# Patient Record
Sex: Female | Born: 1985 | Race: Black or African American | Hispanic: No | Marital: Married | Smoking: Never smoker
Health system: Southern US, Community
[De-identification: ages and names within clinical notes are randomized; demographics above are authoritative.]

## PROBLEM LIST (undated history)

## (undated) DIAGNOSIS — R51 Headache: Secondary | ICD-10-CM

## (undated) DIAGNOSIS — D219 Benign neoplasm of connective and other soft tissue, unspecified: Secondary | ICD-10-CM

## (undated) DIAGNOSIS — R519 Headache, unspecified: Secondary | ICD-10-CM

## (undated) DIAGNOSIS — D649 Anemia, unspecified: Secondary | ICD-10-CM

## (undated) HISTORY — PX: DILATION AND CURETTAGE OF UTERUS: SHX78

## (undated) HISTORY — DX: Benign neoplasm of connective and other soft tissue, unspecified: D21.9

## (undated) HISTORY — DX: Headache, unspecified: R51.9

## (undated) HISTORY — DX: Headache: R51

---

## 2015-02-01 ENCOUNTER — Ambulatory Visit (INDEPENDENT_AMBULATORY_CARE_PROVIDER_SITE_OTHER): Payer: BLUE CROSS/BLUE SHIELD | Admitting: Neurology

## 2015-02-01 ENCOUNTER — Encounter: Payer: Self-pay | Admitting: *Deleted

## 2015-02-01 ENCOUNTER — Encounter: Payer: Self-pay | Admitting: Neurology

## 2015-02-01 VITALS — BP 148/78 | HR 85 | Ht 65.0 in | Wt 219.8 lb

## 2015-02-01 DIAGNOSIS — G43709 Chronic migraine without aura, not intractable, without status migrainosus: Secondary | ICD-10-CM | POA: Diagnosis not present

## 2015-02-01 DIAGNOSIS — H9193 Unspecified hearing loss, bilateral: Secondary | ICD-10-CM

## 2015-02-01 DIAGNOSIS — H5319 Other subjective visual disturbances: Secondary | ICD-10-CM | POA: Diagnosis not present

## 2015-02-01 DIAGNOSIS — G43009 Migraine without aura, not intractable, without status migrainosus: Secondary | ICD-10-CM

## 2015-02-01 DIAGNOSIS — R110 Nausea: Secondary | ICD-10-CM | POA: Diagnosis not present

## 2015-02-01 DIAGNOSIS — R51 Headache with orthostatic component, not elsewhere classified: Secondary | ICD-10-CM

## 2015-02-01 DIAGNOSIS — R519 Headache, unspecified: Secondary | ICD-10-CM

## 2015-02-01 DIAGNOSIS — H53149 Visual discomfort, unspecified: Secondary | ICD-10-CM

## 2015-02-01 MED ORDER — TOPIRAMATE ER 50 MG PO CAP24: 50.0000 mg | ORAL_CAPSULE | Freq: Every day | ORAL | Status: DC

## 2015-02-01 MED ORDER — ONDANSETRON 8 MG PO TBDP: 8.0000 mg | ORAL_TABLET | Freq: Three times a day (TID) | ORAL | Status: DC | PRN

## 2015-02-01 MED ORDER — SUMATRIPTAN SUCCINATE 11 MG/NOSEPC NA EXHP: 2.0000 | INHALANT_POWDER | Freq: Once | NASAL | Status: DC

## 2015-02-01 NOTE — Progress Notes (Signed)
[facility]    Provider:  Dr Jaynee Eagles Referring Provider: Ledell Noss Primary Care Physician:  No primary care provider on file.  CC:  Migraines  HPI:  Maria Osborn is a 29 y.o. female here as a referral from Dr. Laverta Baltimore for migraines since grade school. The migraines are getting more frequent and more severe. Three times a week. When they occur she can't function, she take OTC medication which doesn't work. Imitrex and the oral triptans make her queasy. Headaches are throbbing in the temples, can be occipital as well, she has light sensitivity, no sound sensitivity, no smell triggers, endorses that nausea and vomiting and dizziness acompany the symptoms. They can be 10/10 and she calls out of work and doesn't do anything but lay in a dark room, she has to sit still. No medication overuse.They will last the whole entire day up to 24 hours. She is waking up with headaches. She snores. She has woken herself up snoring. No witnessed apneic events. She is fatigued during the days. Her father has migraines. She has tried verapamil in the past but topamax and others don't sound familiar. She has associated hearing changes and swooshing in the ears. No aura.   Review of Systems: Patient complains of symptoms per HPI as well as the following symptoms: headche, weakness, confusion, dizziness, insomnia, not enough sleep, decreased energy, spinning sensation. Pertinent negatives per HPI. All others negative.   Social History   Social History  . Marital Status: Single    Spouse Name: N/A  . Number of Children: 0  . Years of Education: 16   Occupational History  . [employer]    Social History Main Topics  . Smoking status: Never Smoker   . Smokeless tobacco: Not on file  . Alcohol Use: 0.0 oz/week    0 Standard drinks or equivalent per week     Comment: Socially  . Drug Use: No  . Sexual Activity: Not on file   Other Topics Concern  . Not on file   Social  History Narrative   Lives at home with boyfriend   Caffeine use: Drinks tea (4 cups per week)   No soda/coffee    Family History  Problem Relation Age of Onset  . Migraines Father     Past Medical History  Diagnosis Date  . Headache     Past Surgical History  Procedure Laterality Date  . No past surgeries      Current Outpatient Prescriptions  Medication Sig Dispense Refill  . ferrous sulfate 325 (65 FE) MG tablet Take 325 mg by mouth 2 (two) times daily.  2  . ondansetron (ZOFRAN ODT) 8 MG disintegrating tablet Take 1 tablet (8 mg total) by mouth every 8 (eight) hours as needed for nausea or vomiting. 30 tablet 11  . SUMAtriptan Succinate (ONZETRA XSAIL) 11 MG/NOSEPC EXHP Place 2 sprays into the nose once. 2 each 0  . Topiramate ER (TROKENDI XR) 50 MG CP24 Take 50 mg by mouth at bedtime. 30 capsule 11  . Vitamin D, Ergocalciferol, (DRISDOL) 50000 UNITS CAPS capsule TAKE ONE CAPSULE BY MOUTH EVERY WEEK AS DIRECTED  1   No current facility-administered medications for this visit.    Allergies as of 02/01/2015  . (No Known Allergies)    Vitals: BP 148/78 mmHg  Pulse 85  Ht 5\' 5"  (1.651 m)  Wt 219 lb 12.8 oz (99.701 kg)  BMI 36.58 kg/m2  SpO2 98% Last Weight:  Wt Readings from Last  1 Encounters:  02/01/15 219 lb 12.8 oz (99.701 kg)   Last Height:   Ht Readings from Last 1 Encounters:  02/01/15 5\' 5"  (1.651 m)    Physical exam: Exam: Gen: NAD, conversant, well nourised, obese, well groomed                     CV: RRR, no MRG. No Carotid Bruits. No peripheral edema, warm, nontender Eyes: Conjunctivae clear without exudates or hemorrhage  Neuro: Detailed Neurologic Exam  Speech:    Speech is normal; fluent and spontaneous with normal comprehension.  Cognition:    The patient is oriented to person, place, and time;     recent and remote memory intact;     language fluent;     normal attention, concentration,     fund of knowledge Cranial Nerves:    The  pupils are equal, round, and reactive to light. The fundi are normal and spontaneous venous pulsations are present. Visual fields are full to finger confrontation. Extraocular movements are intact. Trigeminal sensation is intact and the muscles of mastication are normal. The face is symmetric. The palate elevates in the midline. Hearing intact. Voice is normal. Shoulder shrug is normal. The tongue has normal motion without fasciculations.   Coordination:    Normal finger to nose and heel to shin. Normal rapid alternating movements.   Gait:    Heel-toe and tandem gait are normal.   Motor Observation:    No asymmetry, no atrophy, and no involuntary movements noted. Tone:    Normal muscle tone.    Posture:    Posture is normal. normal erect    Strength:    Strength is V/V in the upper and lower limbs.      Sensation: intact to LT     Reflex Exam:  DTR's:    Deep tendon reflexes in the upper and lower extremities are normal bilaterally.   Toes:    The toes are downgoing bilaterally.   Clonus:    Clonus is absent.      Assessment/Plan:  29 year old with chronic migraines.  As far as your medications are concerned, I would like to suggest:  Trokendi 25mg  at night then increase to 50mg  at night. Discussed side effects including teratogenicity, nephrolithiasis, paresthesias, somnolence and other side effects. Call for anything concerning.  Onzetra at onset of headache. Can repeat once in 2 hours if needed. Do not take if pregnant. May cause side effects such as bad taste, sinus/nose pain, mild headache (not a migraine); pressure or heavy feeling in any part of your body; feeling hot or cold; dizziness, spinning sensation; drowsiness; nausea, vomiting, drooling; unusual taste in your mouth after using the nasal spray; Stop for CP, SOB.   Zofran as needed for nausea  As far as diagnostic testing: MRi of the brain  To prevent or relieve headaches, try the following: Cool Compress.  Lie down and place a cool compress on your head.  Avoid headache triggers. If certain foods or odors seem to have triggered your migraines in the past, avoid them. A headache diary might help you identify triggers.  Include physical activity in your daily routine. Try a daily walk or other moderate aerobic exercise.  Manage stress. Find healthy ways to cope with the stressors, such as delegating tasks on your to-do list.  Practice relaxation techniques. Try deep breathing, yoga, massage and visualization.  Eat regularly. Eating regularly scheduled meals and maintaining a healthy diet might help prevent  headaches. Also, drink plenty of fluids.  Follow a regular sleep schedule. Sleep deprivation might contribute to headaches Consider biofeedback. With this mind-body technique, you learn to control certain bodily functions - such as muscle tension, heart rate and blood pressure - to prevent headaches or reduce headache pain.    Proceed to emergency room if you experience new or worsening symptoms or symptoms do not resolve, if you have new neurologic symptoms or if headache is severe, or for any concerning symptom.   CC:Dr. Shanon Rosser  Sarina Ill, MD  [facility] [address] [place], [place] [address]  Phone [phone] Fax [phone]

## 2015-02-01 NOTE — Patient Instructions (Signed)
Overall you are doing fairly well but I do want to suggest a few things today:   Remember to drink plenty of fluid, eat healthy meals and do not skip any meals. Try to eat protein with a every meal and eat a healthy snack such as fruit or nuts in between meals. Try to keep a regular sleep-wake schedule and try to exercise daily, particularly in the form of walking, 20-30 minutes a day, if you can.   As far as your medications are concerned, I would like to suggest:   Trokendi 25mg  at night then increase to 50mg  at night. Onzetra at onset of headache. Can repeat once in 2 hours if needed. Zofran as needed for nausea  As far as diagnostic testing: MRi of the brain  I would like to see you back in 3 months, sooner if we need to. Please call us with any interim questions, concerns, problems, updates or refill requests.   Please also call us for any test results so we can go over those with you on the phone.  My clinical assistant and will answer any of your questions and relay your messages to me and also relay most of my messages to you.   Our phone number is [phone]. We also have an after hours call service for urgent matters and there is a physician on-call for urgent questions. For any emergencies you know to call 911 or go to the nearest emergency room

## 2015-02-05 ENCOUNTER — Encounter: Payer: Self-pay | Admitting: Neurology

## 2015-02-05 DIAGNOSIS — G43709 Chronic migraine without aura, not intractable, without status migrainosus: Secondary | ICD-10-CM | POA: Insufficient documentation

## 2015-05-02 ENCOUNTER — Encounter: Payer: Self-pay | Admitting: Nurse Practitioner

## 2015-05-02 ENCOUNTER — Ambulatory Visit: Payer: BLUE CROSS/BLUE SHIELD | Admitting: Neurology

## 2015-05-02 ENCOUNTER — Ambulatory Visit (INDEPENDENT_AMBULATORY_CARE_PROVIDER_SITE_OTHER): Payer: BLUE CROSS/BLUE SHIELD | Admitting: Nurse Practitioner

## 2015-05-02 ENCOUNTER — Other Ambulatory Visit: Payer: Self-pay | Admitting: *Deleted

## 2015-05-02 ENCOUNTER — Telehealth: Payer: Self-pay | Admitting: *Deleted

## 2015-05-02 VITALS — BP 138/87 | HR 98 | Ht 65.0 in | Wt 221.2 lb

## 2015-05-02 DIAGNOSIS — G43709 Chronic migraine without aura, not intractable, without status migrainosus: Secondary | ICD-10-CM

## 2015-05-02 DIAGNOSIS — G43009 Migraine without aura, not intractable, without status migrainosus: Secondary | ICD-10-CM

## 2015-05-02 NOTE — Progress Notes (Addendum)
[facility]  PATIENT: Maria Osborn DOB: Oct 20, 1985   REASON FOR VISIT:migraine, worsening headaches,  HISTORY FROM:patient    HISTORY OF PRESENT ILLNESS:[name]30 year old female returns for follow-up. She was placed on trokendi and got up to 50 mg daily by samples however she ever called for a refill. She has been out of medication for 3 weeks her headaches are worse. She is wanting a prescription to be called in.[place] did [place] like the [place] She says she had nose bleeds.she is currently having headaches about 3 times a week but this was greatly reduced when she was on trokendi. She returns for reevaluation   HISTORY: Maria Osborn is a 30 y.o. female here as a referral from Dr. Laverta Baltimore for migraines since grade school. The migraines are getting more frequent and more severe. Three times a week. When they occur she can't function, she take OTC medication which doesn't work. Imitrex and the oral triptans make her queasy. Headaches are throbbing in the temples, can be occipital as well, she has light sensitivity, no sound sensitivity, no smell triggers, endorses that nausea and vomiting and dizziness acompany the symptoms. They can be 10/10 and she calls out of work and doesn't do anything but lay in a dark room, she has to sit still. No medication overuse.They will last the whole entire day up to 24 hours. She is waking up with headaches. She snores. She has woken herself up snoring. No witnessed apneic events. She is fatigued during the days. Her father has migraines. She has tried verapamil in the past but topamax and others don't sound familiar. She has associated hearing changes and swooshing in the ears. No aura.    REVIEW OF SYSTEMS: Full 14 system review of systems performed and notable only for those listed, all others are neg:  Constitutional: neg  Cardiovascular: neg Ear/Nose/Throat: neg  Skin: neg Eyes: light sensitivity Respiratory:  neg Gastroitestinal: neg  Hematology/Lymphatic: neg  Endocrine: neg Musculoskeletal:neg Allergy/Immunology: neg Neurological: headache Psychiatric: neg Sleep : neg   ALLERGIES: No Known Allergies  HOME MEDICATIONS: Outpatient Prescriptions Prior to Visit  Medication Sig Dispense Refill  . ferrous sulfate 325 (65 FE) MG tablet Take 325 mg by mouth 2 (two) times daily.  2  . ondansetron (ZOFRAN ODT) 8 MG disintegrating tablet Take 1 tablet (8 mg total) by mouth every 8 (eight) hours as needed for nausea or vomiting. 30 tablet 11  . SUMAtriptan Succinate (ONZETRA XSAIL) 11 MG/NOSEPC EXHP Place 2 sprays into the nose once. 2 each 0  . Vitamin D, Ergocalciferol, (DRISDOL) 50000 UNITS CAPS capsule TAKE ONE CAPSULE BY MOUTH EVERY WEEK AS DIRECTED  1  . Topiramate ER (TROKENDI XR) 50 MG CP24 Take 50 mg by mouth at bedtime. (Patient not taking: Reported on 05/02/2015) 30 capsule 11   No facility-administered medications prior to visit.    PAST MEDICAL HISTORY: Past Medical History  Diagnosis Date  . Headache     PAST SURGICAL HISTORY: Past Surgical History  Procedure Laterality Date  . No past surgeries      FAMILY HISTORY: Family History  Problem Relation Age of Onset  . Migraines Father     SOCIAL HISTORY: Social History   Social History  . Marital Status: Single    Spouse Name: N/A  . Number of Children: 0  . Years of Education: 16   Occupational History  . [employer]    Social History Main Topics  . Smoking status: Never Smoker   .  Smokeless tobacco: Not on file  . Alcohol Use: 0.0 oz/week    0 Standard drinks or equivalent per week     Comment: Socially  . Drug Use: No  . Sexual Activity: Not on file   Other Topics Concern  . Not on file   Social History Narrative   Lives at home with boyfriend   Caffeine use: Drinks tea (4 cups per week)   No soda/coffee     PHYSICAL EXAM  Filed Vitals:   05/02/15 1632  BP: 138/87  Pulse: 98  Height:  5\' 5"  (1.651 m)  Weight: 221 lb 3.2 oz (100.336 kg)   Body mass index is 36.81 kg/(m^2).  Generalized: Well developed, in no acute distress  Head: normocephalic and atraumatic,. Oropharynx benign  Neck: Supple, no carotid bruits  Cardiac: Regular rate rhythm, no murmur  Musculoskeletal: No deformity   Neurological examination   Mentation: Alert oriented to time, place, history taking. Attention span and concentration appropriate. Recent and remote memory intact.  Follows all commands speech and language fluent.   Cranial nerve II-XII: Fundoscopic exam reveals sharp disc margins.Pupils were equal round reactive to light extraocular movements were full, visual field were full on confrontational test. Facial sensation and strength were normal. hearing was intact to finger rubbing bilaterally. Uvula tongue midline. head turning and shoulder shrug were normal and symmetric.Tongue protrusion into cheek strength was normal. Motor: normal bulk and tone, full strength in the BUE, BLE, fine finger movements normal, no pronator drift. No focal weakness Sensory: normal and symmetric to light touch, pinprick, and  Vibration, proprioception  Coordination: finger-nose-finger, heel-to-shin bilaterally, no dysmetria Reflexes: Brachioradialis 2/2, biceps 2/2, triceps 2/2, patellar 2/2, Achilles 2/2, plantar responses were flexor bilaterally. Gait and Station: Rising up from seated position without assistance, normal stance,  moderate stride, good arm swing, smooth turning, able to perform tiptoe, and heel walking without difficulty. Tandem gait is steady  DIAGNOSTIC DATA (LABS, IMAGING, TESTING) - ASSESSMENT AND PLAN  30 y.o. year old female  has a past medical history of Headache. here to follow up for her headaches. She is having about 3 weeks that she stopped her trokendi 3 weeks ago.Onzetra causes nosebleeds.She has not scheduled for MRI of the brain which  was ordered by Dr.Ahern.   Schedule MRI of  the brain ordered by Dr. Jaynee Eagles 02/01/15.  Will order Trokendi 50mg  daily called in F/U in 3 months Dennie Bible, [facility], [facility], APRN  [facility] [address], [place], [place] [address] [phone]   Personally examined images, and have participated in and made any corrections needed to history, physical, neuro exam,assessment and plan as stated above and agree with plan.    Sarina Ill, MD Neurology [facility]

## 2015-05-02 NOTE — Telephone Encounter (Signed)
Pt agreed to move and be seen by Hoyle Sauer. Appt time is 4:30p today.

## 2015-05-02 NOTE — Patient Instructions (Signed)
Schedule MRI of the brain ordered by Dr. Jaynee Eagles 02/01/15.  Will order Trokendi 50mg  daily F/U in 3 months

## 2015-05-02 NOTE — Telephone Encounter (Signed)
Pt did not receive, or was not at pharmacy.   I gave verbal to pharmacist at [facility].   Topiramate 50mg  po bedtime.   # 30, 11 refills.

## 2015-05-02 NOTE — Telephone Encounter (Signed)
LVM for pt to call. Dr Jaynee Eagles would like her to see Maria Osborn today if possible so she can fit in a sick pt. If not, that's okay. Gave [facility] phone number

## 2015-05-03 MED ORDER — TOPIRAMATE ER 50 MG PO CAP24: 50.0000 mg | ORAL_CAPSULE | Freq: Every day | ORAL | Status: DC

## 2015-05-07 ENCOUNTER — Other Ambulatory Visit: Payer: Self-pay | Admitting: Neurology

## 2015-05-07 ENCOUNTER — Telehealth: Payer: Self-pay | Admitting: Nurse Practitioner

## 2015-05-07 MED ORDER — TOPIRAMATE 50 MG PO TABS: 50.0000 mg | ORAL_TABLET | Freq: Every day | ORAL | Status: DC

## 2015-05-07 NOTE — Telephone Encounter (Signed)
Patient called to advise pharmacy is waiting on insurance approval for topiramate (TOPAMAX) 50 MG tablet, when patient called [employer], they advised patient that they are waiting on our office to fax over authorization release from the Provider. Patient would like to speak to nurse, please call [phone].

## 2015-05-08 NOTE — Telephone Encounter (Signed)
Called pt back and advised Dr Jaynee Eagles called in new rx topiramate (not ER) to her pharmacy yesterday. I called her pharmacy and it's ready to be picked up. No Pa required. Pt verbalized understanding. She also stated she is going to drop off FMLA paperwork. Advised there is a 25 dollar fee and she has at least 14 days to complete. She states paperwork due 05/30/15.

## 2015-05-10 ENCOUNTER — Telehealth: Payer: Self-pay | Admitting: *Deleted

## 2015-05-10 NOTE — Telephone Encounter (Signed)
Patient FMLA form on [employer].

## 2015-05-11 DIAGNOSIS — Z0289 Encounter for other administrative examinations: Secondary | ICD-10-CM

## 2015-05-17 ENCOUNTER — Telehealth: Payer: Self-pay | Admitting: *Deleted

## 2015-05-17 NOTE — Telephone Encounter (Signed)
Gave completed FMLA paperwork back to Hilda Blades in medical records to send.

## 2015-05-17 NOTE — Telephone Encounter (Signed)
Patient form was faxed to [facility] on 05/17/15.

## 2015-06-06 ENCOUNTER — Ambulatory Visit (INDEPENDENT_AMBULATORY_CARE_PROVIDER_SITE_OTHER): Payer: BLUE CROSS/BLUE SHIELD

## 2015-06-06 DIAGNOSIS — R110 Nausea: Secondary | ICD-10-CM | POA: Diagnosis not present

## 2015-06-06 DIAGNOSIS — H9193 Unspecified hearing loss, bilateral: Secondary | ICD-10-CM

## 2015-06-06 DIAGNOSIS — R51 Headache with orthostatic component, not elsewhere classified: Secondary | ICD-10-CM

## 2015-06-06 DIAGNOSIS — R519 Headache, unspecified: Secondary | ICD-10-CM

## 2015-06-07 MED ORDER — GADOPENTETATE DIMEGLUMINE 469.01 MG/ML IV SOLN: 20.0000 mL | Freq: Once | INTRAVENOUS | Status: AC | PRN

## 2015-06-11 ENCOUNTER — Telehealth: Payer: Self-pay | Admitting: *Deleted

## 2015-06-11 NOTE — Telephone Encounter (Signed)
-----   Message from Melvenia Beam, MD sent at 06/08/2015  3:34 PM EDT ----- Mri of the brain is normal thanks

## 2015-06-11 NOTE — Telephone Encounter (Signed)
Called and spoke to pt about normal MRI brain per Dr Ahern. Pt verbalized understanding.  

## 2015-08-08 ENCOUNTER — Encounter: Payer: Self-pay | Admitting: Nurse Practitioner

## 2015-08-08 ENCOUNTER — Ambulatory Visit (INDEPENDENT_AMBULATORY_CARE_PROVIDER_SITE_OTHER): Payer: BLUE CROSS/BLUE SHIELD | Admitting: Nurse Practitioner

## 2015-08-08 VITALS — BP 132/80 | HR 83 | Ht 65.0 in | Wt 222.0 lb

## 2015-08-08 DIAGNOSIS — G43709 Chronic migraine without aura, not intractable, without status migrainosus: Secondary | ICD-10-CM | POA: Diagnosis not present

## 2015-08-08 MED ORDER — TOPIRAMATE 50 MG PO TABS: 50.0000 mg | ORAL_TABLET | Freq: Two times a day (BID) | ORAL | Status: DC

## 2015-08-08 MED ORDER — RIZATRIPTAN BENZOATE 10 MG PO TBDP: 10.0000 mg | ORAL_TABLET | ORAL | Status: DC | PRN

## 2015-08-08 NOTE — Progress Notes (Addendum)
[facility]  PATIENT: Maria Osborn DOB: [birth year]   REASON FOR VISIT: follow up for chronic migraine, neck pain HISTORY FROM:patient    HISTORY OF PRESENT ILLNESS:[name]30 year old female returns for follow-up. She was placed on trokendi and got up to 50 mg daily by samples however insurance would no. When last seen 05/02/2015 she was switched to generic topiramate. Sumatriptan not effective, and  caused nasal irritation. Headache (location now back of head, and on and off during day).   She is wanting a prescription to be called in.[place] did [place] the [place] She says she had nose bleeds.she is currently having headaches about 3 times a week but this was greatly reduced when she was on trokendi. She returns for reevaluation   HISTORY: Maria Osborn is a 30 y.o. female here as a referral from Dr. Laverta Baltimore for migraines since grade school. The migraines are getting more frequent and more severe. Three times a week. When they occur she can't function, she take OTC medication which doesn't work. Imitrex and the oral triptans make her queasy. Headaches are throbbing in the temples, can be occipital as well, she has light sensitivity, no sound sensitivity, no smell triggers, endorses that nausea and vomiting and dizziness acompany the symptoms. They can be 10/10 and she calls out of work and doesn't do anything but lay in a dark room, she has to sit still. No medication overuse.They will last the whole entire day up to 24 hours. She is waking up with headaches. She snores. She has woken herself up snoring. No witnessed apneic events. She is fatigued during the days. Her father has migraines. She has tried verapamil in the past but topamax and others don't sound familiar. She has associated hearing changes and swooshing in the ears. No aura.    REVIEW OF SYSTEMS: Full 14 system review of systems performed and notable only for those listed, all others are neg:    Constitutional: neg  Cardiovascular: neg Ear/Nose/Throat: neg  Skin: neg Eyes: neg Respiratory: neg Gastroitestinal: neg  Hematology/Lymphatic: neg  Endocrine: neg Musculoskeletal:neck stiffness Allergy/Immunology: neg Neurological: headache Psychiatric: neg Sleep : neg   ALLERGIES: No Known Allergies  HOME MEDICATIONS: Outpatient Prescriptions Prior to Visit  Medication Sig Dispense Refill  . ferrous sulfate 325 (65 FE) MG tablet Take 325 mg by mouth 2 (two) times daily.  2  . ondansetron (ZOFRAN ODT) 8 MG disintegrating tablet Take 1 tablet (8 mg total) by mouth every 8 (eight) hours as needed for nausea or vomiting. 30 tablet 11  . topiramate (TOPAMAX) 50 MG tablet Take 1 tablet (50 mg total) by mouth at bedtime. 30 tablet 12  . Vitamin D, Ergocalciferol, (DRISDOL) 50000 UNITS CAPS capsule TAKE ONE CAPSULE BY MOUTH EVERY WEEK AS DIRECTED  1  . SUMAtriptan Succinate (ONZETRA XSAIL) 11 MG/NOSEPC EXHP Place 2 sprays into the nose once. (Patient not taking: Reported on 08/08/2015) 2 each 0   Facility-Administered Medications Prior to Visit  Medication Dose Route Frequency Provider Last Rate Last Dose  . gadopentetate dimeglumine (MAGNEVIST) injection 20 mL  20 mL Intravenous Once PRN Melvenia Beam, MD        PAST MEDICAL HISTORY: Past Medical History  Diagnosis Date  . Headache     PAST SURGICAL HISTORY: Past Surgical History  Procedure Laterality Date  . No past surgeries      FAMILY HISTORY: Family History  Problem Relation Age of Onset  . Migraines Father     SOCIAL HISTORY:  Social History   Social History  . Marital Status: Single    Spouse Name: N/A  . Number of Children: 0  . Years of Education: 16   Occupational History  . [employer]    Social History Main Topics  . Smoking status: Never Smoker   . Smokeless tobacco: Not on file  . Alcohol Use: 0.0 oz/week    0 Standard drinks or equivalent per week     Comment: Socially  . Drug Use:  No  . Sexual Activity: Not on file   Other Topics Concern  . Not on file   Social History Narrative   Lives at home with boyfriend   Caffeine use: Drinks tea (4 cups per week)   No soda/coffee     PHYSICAL EXAM  Filed Vitals:   08/08/15 1626  BP: 132/80  Pulse: 83  Height: 5\' 5"  (075-GRM m)  Weight: 222 lb (100.699 kg)   Body mass index is 36.94 kg/(m^2). Generalized: Well developed, in no acute distress  Head: normocephalic and atraumatic,. Oropharynx benign  Neck: Supple, no carotid bruits  Cardiac: Regular rate rhythm, no murmur  Musculoskeletal: No deformity   Neurological examination   Mentation: Alert oriented to time, place, history taking. Attention span and concentration appropriate. Recent and remote memory intact. Follows all commands speech and language fluent.   Cranial nerve II-XII: Fundoscopic exam reveals sharp disc margins.Pupils were equal round reactive to light extraocular movements were full, visual field were full on confrontational test. Facial sensation and strength were normal. hearing was intact to finger rubbing bilaterally. Uvula tongue midline. head turning and shoulder shrug were normal and symmetric.Tongue protrusion into cheek strength was normal. Motor: normal bulk and tone, full strength in the BUE, BLE, fine finger movements normal, no pronator drift. No focal weakness Sensory: normal and symmetric to light touch, pinprick, and Vibration, proprioception  Coordination: finger-nose-finger, heel-to-shin bilaterally, no dysmetria Reflexes: Brachioradialis 2/2, biceps 2/2, triceps 2/2, patellar 2/2, Achilles 2/2, plantar responses were flexor bilaterally. Gait and Station: Rising up from seated position without assistance, normal stance, moderate stride, good arm swing, smooth turning, able to perform tiptoe, and heel walking without difficulty. Tandem gait is steady  DIAGNOSTIC DATA (LABS, IMAGING, TESTING)  ASSESSMENT AND PLAN  30 y.o.  year old female  has a past medical history of Headache. here to follow up She is currently h 3 headaches a week and also complaining with some neck pain. MRI of the brain after her last visit was normal.  Increase Topamax  To 50 mg twice daily Try Maxalt 10 mg acutely melt Given neck exercises to perform  Keep a record of your headaches Follow-up in 3 mo Dennie Bible, [facility], [facility], APRN  Personally  participated in and made any corrections needed to history, physical, neuro exam,assessment and plan as stated above.   Sarina Ill, MD [facility] [address] [place], [place] [address] [phone]

## 2015-08-08 NOTE — Patient Instructions (Signed)
Increase Topamax  To 50 mg twice daily Try Maxalt 10 mg acutely melt Given neck exercises to Follow-up in 3 mo

## 2015-11-13 ENCOUNTER — Ambulatory Visit: Payer: BLUE CROSS/BLUE SHIELD | Admitting: Nurse Practitioner

## 2015-11-14 ENCOUNTER — Encounter: Payer: Self-pay | Admitting: Nurse Practitioner

## 2015-12-12 ENCOUNTER — Encounter: Payer: Self-pay | Admitting: Nurse Practitioner

## 2015-12-12 ENCOUNTER — Ambulatory Visit (INDEPENDENT_AMBULATORY_CARE_PROVIDER_SITE_OTHER): Payer: BLUE CROSS/BLUE SHIELD | Admitting: Nurse Practitioner

## 2015-12-12 VITALS — BP 116/74 | HR 72 | Ht 65.0 in | Wt 219.0 lb

## 2015-12-12 DIAGNOSIS — G43709 Chronic migraine without aura, not intractable, without status migrainosus: Secondary | ICD-10-CM | POA: Diagnosis not present

## 2015-12-12 MED ORDER — TOPIRAMATE 50 MG PO TABS: 50.0000 mg | ORAL_TABLET | Freq: Two times a day (BID) | ORAL | 6 refills | Status: DC

## 2015-12-12 MED ORDER — RIZATRIPTAN BENZOATE 10 MG PO TBDP: 10.0000 mg | ORAL_TABLET | ORAL | 6 refills | Status: DC | PRN

## 2015-12-12 NOTE — Patient Instructions (Signed)
Continue  Topamax   50 mg twice daily will refill Continue  Maxalt 10 mg acutely melt, will refill Continue  neck exercises Continue to keep a record of your headaches Follow-up in 6 mo

## 2015-12-12 NOTE — Progress Notes (Signed)
[facility]  PATIENT: Maria Osborn DOB: Sep 21, 1985   REASON FOR VISIT: follow up for chronic migraine, neck pain HISTORY FROM:patient    HISTORY OF PRESENT ILLNESS:UPDATE 10/11/2017CM Ms. Osborn, 30 year old female returns for follow-up with history of migraines. When last seen her Topamax was increased to 50mg  mg twice daily in addition her triptan was changed to Maxalt MLT. She has kept a record since that time she had 5 headaches in June ,7 in July ,0 in August , 1 in September and 1 in October. She has also found the neck exercises be beneficial, she sleeps about 6 hours at night and does not have consistent sleep-wake schedule. She returns for reevaluation   UPDATE 08/08/15 CMMs. [name]50 year old female returns for follow-up. She was placed on trokendi and got up to 50 mg daily by samples however insurance would no. When last seen 05/02/2015 she was switched to generic topiramate. Sumatriptan not effective, and  caused nasal irritation. Headache (location now back of head, and on and off during day).   She is wanting a prescription to be called in.She did not like the [place] She says she had nose bleeds.she is currently having headaches about 3 times a week but this was greatly reduced when she was on trokendi. She returns for reevaluation   HISTORY: Maria Osborn is a 30 y.o. female here as a referral from Dr. Laverta Baltimore for migraines since grade school. The migraines are getting more frequent and more severe. Three times a week. When they occur she can't function, she take OTC medication which doesn't work. Imitrex and the oral triptans make her queasy. Headaches are throbbing in the temples, can be occipital as well, she has light sensitivity, no sound sensitivity, no smell triggers, endorses that nausea and vomiting and dizziness acompany the symptoms. They can be 10/10 and she calls out of work and doesn't do anything but lay in a dark room, she has to sit  still. No medication overuse.They will last the whole entire day up to 24 hours. She is waking up with headaches. She snores. She has woken herself up snoring. No witnessed apneic events. She is fatigued during the days. Her father has migraines. She has tried verapamil in the past but topamax and others don't sound familiar. She has associated hearing changes and swooshing in the ears. No aura.    REVIEW OF SYSTEMS: Full 14 system review of systems performed and notable only for those listed, all others are neg:  Constitutional: Fatigue  Cardiovascular: neg Ear/Nose/Throat: neg  Skin: neg Eyes: neg Respiratory: neg Gastroitestinal: neg  Hematology/Lymphatic: Anemia Endocrine: neg Musculoskeletal:neck stiffness Allergy/Immunology: neg Neurological: headache Psychiatric: neg Sleep : neg   ALLERGIES: No Known Allergies  HOME MEDICATIONS: Outpatient Medications Prior to Visit  Medication Sig Dispense Refill  . ferrous sulfate 325 (65 FE) MG tablet Take 325 mg by mouth 2 (two) times daily.  2  . ondansetron (ZOFRAN ODT) 8 MG disintegrating tablet Take 1 tablet (8 mg total) by mouth every 8 (eight) hours as needed for nausea or vomiting. 30 tablet 11  . rizatriptan (MAXALT-MLT) 10 MG disintegrating tablet Take 1 tablet (10 mg total) by mouth as needed for migraine. May repeat in 2 hours if needed 10 tablet 3  . topiramate (TOPAMAX) 50 MG tablet Take 1 tablet (50 mg total) by mouth 2 (two) times daily. 60 tablet 3  . Vitamin D, Ergocalciferol, (DRISDOL) 50000 UNITS CAPS capsule TAKE ONE CAPSULE BY MOUTH EVERY WEEK AS DIRECTED  1  Facility-Administered Medications Prior to Visit  Medication Dose Route Frequency Provider Last Rate Last Dose  . gadopentetate dimeglumine (MAGNEVIST) injection 20 mL  20 mL Intravenous Once PRN Melvenia Beam, MD        PAST MEDICAL HISTORY: Past Medical History:  Diagnosis Date  . Headache     PAST SURGICAL HISTORY: Past Surgical History:    Procedure Laterality Date  . NO PAST SURGERIES      FAMILY HISTORY: Family History  Problem Relation Age of Onset  . Migraines Father     SOCIAL HISTORY: Social History   Social History  . Marital status: Single    Spouse name: N/A  . Number of children: 0  . Years of education: 74   Occupational History  . [employer]    Social History Main Topics  . Smoking status: Never Smoker  . Smokeless tobacco: Never Used  . Alcohol use 0.0 oz/week     Comment: Socially  . Drug use: No  . Sexual activity: Not on file   Other Topics Concern  . Not on file   Social History Narrative   Lives at home with boyfriend   Caffeine use: Drinks tea (4 cups per week)   No soda/coffee     PHYSICAL EXAM  Vitals:   12/12/15 1501  BP: 116/74  Pulse: 72  Weight: 219 lb (99.3 kg)  Height: 5\' 5"  (1.651 m)   Body mass index is 36.44 kg/m. Generalized: Well developed, in no acute distress  Head: normocephalic and atraumatic,. Oropharynx benign  Neck: Supple, no carotid bruits  Cardiac: Regular rate rhythm, no murmur  Musculoskeletal: No deformity   Neurological examination   Mentation: Alert oriented to time, place, history taking. Attention span and concentration appropriate. Recent and remote memory intact. Follows all commands speech and language fluent.   Cranial nerve II-XII: Pupils were equal round reactive to light extraocular movements were full, visual field were full on confrontational test. Facial sensation and strength were normal. hearing was intact to finger rubbing bilaterally. Uvula tongue midline. head turning and shoulder shrug were normal and symmetric.Tongue protrusion into cheek strength was normal. Motor: normal bulk and tone, full strength in the BUE, BLE, fine finger movements normal, no pronator drift. No focal weakness Sensory: normal and symmetric to light touch, pinprick, and Vibration, In the upper and lower extremities Coordination:  finger-nose-finger, heel-to-shin bilaterally, no dysmetria Reflexes: Brachioradialis 2/2, biceps 2/2, triceps 2/2, patellar 2/2, Achilles 2/2, plantar responses were flexor bilaterally. Gait and Station: Rising up from seated position without assistance, normal stance, moderate stride, good arm swing, smooth turning, able to perform tiptoe, and heel walking without difficulty. Tandem gait is steady  DIAGNOSTIC DATA (LABS, IMAGING, TESTING)  ASSESSMENT AND PLAN  30 y.o. year old female  has a past medical history of Headache. here to follow up . She  had 5 headaches in June ,7 in July ,0 in August , 1 in September and 1 in October.  Continue  Topamax   50 mg twice daily will refill Continue  Maxalt 10 mg acutely melt, will refill Continue  neck exercises Continue to keep a record of your headaches Follow-up in 6 mo Dennie Bible, [facility], [facility], [facility] [address], [place],  [address] [phone]

## 2016-01-03 NOTE — Progress Notes (Signed)
Personally have participated in and made any corrections needed to history, physical, neuro exam,assessment and plan as stated above.    Kemiya Batdorf, MD [facility] 

## 2016-04-09 ENCOUNTER — Encounter: Payer: Self-pay | Admitting: Nurse Practitioner

## 2016-04-09 ENCOUNTER — Ambulatory Visit (INDEPENDENT_AMBULATORY_CARE_PROVIDER_SITE_OTHER): Payer: BLUE CROSS/BLUE SHIELD | Admitting: Nurse Practitioner

## 2016-04-09 VITALS — BP 133/86 | HR 85 | Ht 65.0 in | Wt 225.8 lb

## 2016-04-09 DIAGNOSIS — R400 Somnolence: Secondary | ICD-10-CM

## 2016-04-09 DIAGNOSIS — G43709 Chronic migraine without aura, not intractable, without status migrainosus: Secondary | ICD-10-CM | POA: Diagnosis not present

## 2016-04-09 NOTE — Patient Instructions (Signed)
Continue  Topamax   50 mg twice daily  Continue  Maxalt 10 mg acutely melt,  Continue  neck exercises Continue to keep a record of your headaches Will order sleep study for daytime somnolence  Given information on sleep hygiene Follow-up in 3 mo

## 2016-04-09 NOTE — Progress Notes (Signed)
[facility]  PATIENT: Maria Osborn DOB: 1985/06/20   REASON FOR VISIT: follow up for worsening headaches , neck pain, new complaint of daytime drowsiness HISTORY FROM:patient    HISTORY OF PRESENT ILLNESS:UPDATE 02/07/2018CM Maria Osborn, 31 year old female returns for follow-up with a long history of migraines which have worsened recently. She has had migraines since grade school.  Headaches are throbbing in the temples, can be occipital as well, she has light sensitivity, no sound sensitivity, no smell triggers, endorses that nausea and vomiting and dizziness acompany the symptoms. They can be 10/10 and she calls out of work and doesn't do anything but lay in a dark room. She has kept a record of her headaches since last seen in October, 4 for November 6 for December 14 for January and so far 3 for February. She woke with a headache this morning. Maxalt did not help. She does admit to snoring. She is not aware of any apnea events. She complains with significant daytime drowsiness. She is currently on Topamax 50 mg twice daily and her triptan is Maxalt melt. She also does not have consistent sleep-wake cycle. She returns for reevaluation  REVIEW OF SYSTEMS: Full 14 system review of systems performed and notable only for those listed, all others are neg:  Constitutional: Fatigue  Cardiovascular: neg Ear/Nose/Throat: Ringing in the ears Skin: neg Eyes: Light sensitivity Respiratory: neg Gastroitestinal: neg  Hematology/Lymphatic: Anemia Endocrine: neg Musculoskeletal:neg Allergy/Immunology: neg Neurological: headache Psychiatric: neg Sleep : Daytime drowsiness   ALLERGIES: No Known Allergies  HOME MEDICATIONS: Outpatient Medications Prior to Visit  Medication Sig Dispense Refill  . ferrous sulfate 325 (65 FE) MG tablet Take 325 mg by mouth 2 (two) times daily.  2  . ondansetron (ZOFRAN ODT) 8 MG disintegrating tablet Take 1 tablet (8 mg total) by mouth  every 8 (eight) hours as needed for nausea or vomiting. 30 tablet 11  . rizatriptan (MAXALT-MLT) 10 MG disintegrating tablet Take 1 tablet (10 mg total) by mouth as needed for migraine. May repeat in 2 hours if needed 10 tablet 6  . topiramate (TOPAMAX) 50 MG tablet Take 1 tablet (50 mg total) by mouth 2 (two) times daily. 60 tablet 6  . Vitamin D, Ergocalciferol, (DRISDOL) 50000 UNITS CAPS capsule TAKE ONE CAPSULE BY MOUTH EVERY WEEK AS DIRECTED  1   Facility-Administered Medications Prior to Visit  Medication Dose Route Frequency Provider Last Rate Last Dose  . gadopentetate dimeglumine (MAGNEVIST) injection 20 mL  20 mL Intravenous Once PRN Melvenia Beam, MD        PAST MEDICAL HISTORY: Past Medical History:  Diagnosis Date  . Headache     PAST SURGICAL HISTORY: Past Surgical History:  Procedure Laterality Date  . NO PAST SURGERIES      FAMILY HISTORY: Family History  Problem Relation Age of Onset  . Migraines Father     SOCIAL HISTORY: Social History   Social History  . Marital status: Single    Spouse name: N/A  . Number of children: 0  . Years of education: 48   Occupational History  . [employer]    Social History Main Topics  . Smoking status: Never Smoker  . Smokeless tobacco: Never Used  . Alcohol use 0.0 oz/week     Comment: Socially  . Drug use: No  . Sexual activity: Not on file   Other Topics Concern  . Not on file   Social History Narrative   Lives at home with boyfriend  Caffeine use: Drinks tea (4 cups per week)   No soda/coffee     PHYSICAL EXAM  Vitals:   04/09/16 0803  BP: 133/86  Pulse: 85  Weight: 225 lb 12.8 oz (102.4 kg)  Height: 5\' 5"  (1.651 m)   Body mass index is 37.58 kg/m. Generalized: Well developed, Obese female in no acute distress  Head: normocephalic and atraumatic,. Oropharynx benign  Neck: Supple, no carotid bruits  Cardiac: Regular rate rhythm, no murmur  Musculoskeletal: No deformity    Neurological examination   Mentation: Alert oriented to time, place, history taking. Attention span and concentration appropriate. Recent and remote memory intact. Follows all commands speech and language fluent. ESS 10 FSS 40  Cranial nerve II-XII: Pupils were equal round reactive to light extraocular movements were full, visual field were full on confrontational test. Facial sensation and strength were normal. hearing was intact to finger rubbing bilaterally. Uvula tongue midline. head turning and shoulder shrug were normal and symmetric.Tongue protrusion into cheek strength was normal. Motor: normal bulk and tone, full strength in the BUE, BLE, fine finger movements normal, no pronator drift. No focal weakness Sensory: normal and symmetric to light touch, pinprick, and Vibration, In the upper and lower extremities Coordination: finger-nose-finger, heel-to-shin bilaterally, no dysmetria Reflexes: Symmetric upper and lower, plantar responses were flexor bilaterally. Gait and Station: Rising up from seated position without assistance, normal stance, moderate stride, good arm swing, smooth turning, able to perform tiptoe, and heel walking without difficulty. Tandem gait is steady  DIAGNOSTIC DATA (LABS, IMAGING, TESTING)  ASSESSMENT AND PLAN  31 y.o. year old female  has a past medical history of Headache. which have worsened here to follow-up. Her headaches are now occurring in the morning on awakening and she complains of daytime somnolence. She  had 4 headaches in November ,6 in December ,14 in January and 3 so far in February . She awoke with a headache this morning and Maxalt was not effective   Continue  Topamax   50 mg twice daily  Continue  Maxalt 10 mg acutely melt,  Continue  neck exercises Continue to keep a record of your headaches Will order sleep study for daytime somnolence ESS 10. FSS 40.  Given information on sleep hygiene Follow-up in 3 mo I spent 25 minutes in   total face to face time with the patient more than 50% of which was spent counseling and coordination of care, reviewing test results reviewing medications and discussing and reviewing the diagnosis of morning headaches and correlation to obstructive sleep apnea. The treatment is not increase medication for headache rather oxygen therapy or CPAP. Discussed the sleep study visit and that she will follow up with the sleep doctor after the study for further treatment options.  Dennie Bible, [facility], [facility], APRN [facility] [address] [place], [place] [address] [phone]

## 2016-05-08 ENCOUNTER — Encounter: Payer: Self-pay | Admitting: Neurology

## 2016-05-08 ENCOUNTER — Telehealth: Payer: Self-pay | Admitting: Nurse Practitioner

## 2016-05-08 ENCOUNTER — Ambulatory Visit (INDEPENDENT_AMBULATORY_CARE_PROVIDER_SITE_OTHER): Payer: BLUE CROSS/BLUE SHIELD | Admitting: Neurology

## 2016-05-08 VITALS — BP 138/80 | HR 82 | Resp 16 | Ht 65.0 in | Wt 228.0 lb

## 2016-05-08 DIAGNOSIS — R400 Somnolence: Secondary | ICD-10-CM | POA: Diagnosis not present

## 2016-05-08 DIAGNOSIS — R51 Headache: Secondary | ICD-10-CM

## 2016-05-08 DIAGNOSIS — R0683 Snoring: Secondary | ICD-10-CM | POA: Diagnosis not present

## 2016-05-08 DIAGNOSIS — E669 Obesity, unspecified: Secondary | ICD-10-CM

## 2016-05-08 DIAGNOSIS — R519 Headache, unspecified: Secondary | ICD-10-CM

## 2016-05-08 NOTE — Progress Notes (Addendum)
Subjective:    Patient ID: Maria Osborn is a 31 y.o. female.  HPI     Star Age, MD, PhD [facility] [address] [place], [place] [address]  Dear Maria Osborn and Maria Osborn,   I saw your patient, Maria Osborn, upon your kind request in my neurologic clinic today for initial consultation of her sleep disorder, in particular, concern for underlying obstructive sleep apnea. The patient is unaccompanied today. As you know, Maria Osborn is a 31 year old right-handed woman with an underlying medical history of migraines, vitamin D deficiency and obesity, who reports snoring, AM HAs and daytime somnolence. I reviewed your office note from 04/09/2016. Her Epworth sleepiness score is 11 out of 24 today, her fatigue score is 39 out of 63 today. She often wakes up with headaches. She has had intermittent difficulty with sleep onset and sleep maintenance, melatonin did not help and OTC sleep aids made her drowsy.  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   Bedtime is usually around 11:30 PM and later if she has to work late. She has to be up by 6:30 AM usually, drinks no caffeine on a regular basis. She snores occasionally, does not have night to night nocturia, no RLS symptoms. Has frequent AM HAs, about 3-4 times/week. Does not smoke, very occ EtOH. Has FHx of migraines in father, has one older brother, no FHx of OSA.  She lives with her fiance. She has no children. She keeps a log of her migraine headaches. In the month of February she has taken up to 10 pills a week of Maxalt. She has been on generic Topamax 50 mg twice daily, does not believe it has helped very much.  Her Past Medical History Is Significant For: Past Medical History:  Diagnosis Date  . Fibroids   . Headache     Her Past Surgical History Is Significant For: Past Surgical History:  Procedure Laterality Date  . NO PAST SURGERIES      Her Family History Is  Significant For: Family History  Problem Relation Age of Onset  . Migraines Father     Her Social History Is Significant For: Social History   Social History  . Marital status: Significant Other    Spouse name: N/A  . Number of children: 0  . Years of education: 83   Occupational History  . [employer]    Social History Main Topics  . Smoking status: Never Smoker  . Smokeless tobacco: Never Used  . Alcohol use 0.0 oz/week     Comment: Socially  . Drug use: No  . Sexual activity: Not Asked   Other Topics Concern  . None   Social History Narrative   Lives at home with boyfriend   Caffeine use: Drinks tea (4 cups per week)   No soda/coffee    Her Allergies Are:  No Known Allergies:   Her Current Medications Are:  Outpatient Encounter Prescriptions as of 05/08/2016  Medication Sig  . Ferrous Sulfate (IRON) 28 MG TABS Take by mouth.  . ondansetron (ZOFRAN ODT) 8 MG disintegrating tablet Take 1 tablet (8 mg total) by mouth every 8 (eight) hours as needed for nausea or vomiting.  . rizatriptan (MAXALT-MLT) 10 MG disintegrating tablet Take 1 tablet (10 mg total) by mouth as needed for migraine. May repeat in 2 hours if needed  . topiramate (TOPAMAX) 50 MG tablet Take 1 tablet (50 mg total) by mouth 2 (  two) times daily.  . Vitamin D, Ergocalciferol, (DRISDOL) 50000 UNITS CAPS capsule TAKE ONE CAPSULE BY MOUTH EVERY WEEK AS DIRECTED  . [DISCONTINUED] ferrous sulfate 325 (65 FE) MG tablet Take 325 mg by mouth 2 (two) times daily.   Facility-Administered Encounter Medications as of 05/08/2016  Medication  . gadopentetate dimeglumine (MAGNEVIST) injection 20 mL  :  Review of Systems:  Out of a complete 14 point review of systems, all are reviewed and negative with the exception of these symptoms as listed below:  Review of Systems  Neurological:       Patient has trouble falling and staying asleep, sometimes snores, wakes up feeling tired, morning headaches, daytime  fatigue, sometimes takes naps.    Epworth Sleepiness Scale 0= would never doze 1= slight chance of dozing 2= moderate chance of dozing 3= high chance of dozing  Sitting and reading:2 Watching TV:3 Sitting inactive in a public place (ex. Theater or meeting):0 As a passenger in a car for an hour without a break:3 Lying down to rest in the afternoon:0 Sitting and talking to someone:0 Sitting quietly after lunch (no alcohol):0 In a car, while stopped in traffic:0 Total:11  Objective:  Neurologic Exam  Physical Exam Physical Examination:   Vitals:   05/08/16 1526  BP: 138/80  Pulse: 82  Resp: 16    General Examination: The patient is a very pleasant 31 y.o. female in no acute distress. She appears well-developed and well-nourished and well groomed.   HEENT: Normocephalic, atraumatic, pupils are equal, round and reactive to light and accommodation. Funduscopic exam is normal with sharp disc margins noted. Extraocular tracking is good without limitation to gaze excursion or nystagmus noted. Normal smooth pursuit is noted. Hearing is grossly intact. Tympanic membranes are clear bilaterally. Face is symmetric with normal facial animation and normal facial sensation. Speech is clear with no dysarthria noted. There is no hypophonia. There is no lip, neck/head, jaw or voice tremor. Neck is supple with full range of passive and active motion. There are no carotid bruits on auscultation. Oropharynx exam reveals: mild mouth dryness, good dental hygiene and mild airway crowding, due to smaller airway entry and tonsils in place. Mallampati is class I. Tongue protrudes centrally and palate elevates symmetrically. Tonsils are 1+. Neck size is 15 7/8 inches.    Chest: Clear to auscultation without wheezing, rhonchi or crackles noted.  Heart: S1+S2+0, regular and normal without murmurs, rubs or gallops noted.   Abdomen: Soft, non-tender and non-distended with normal bowel sounds appreciated on  auscultation.  Extremities: There is no pitting edema in the distal lower extremities bilaterally. Pedal pulses are intact.  Skin: Warm and dry without trophic changes noted.  Musculoskeletal: exam reveals no obvious joint deformities, tenderness or joint swelling or erythema.   Neurologically:  Mental status: The patient is awake, alert and oriented in all 4 spheres. Her immediate and remote memory, attention, language skills and fund of knowledge are appropriate. There is no evidence of aphasia, agnosia, apraxia or anomia. Speech is clear with normal prosody and enunciation. Thought process is linear. Mood is normal and affect is normal.  Cranial nerves II - XII are as described above under HEENT exam. In addition: shoulder shrug is normal with equal shoulder height noted. Motor exam: Normal bulk, strength and tone is noted. There is no drift, tremor or rebound. Romberg is negative. Reflexes are 1-2+. Fine motor skills and coordination: intact with normal finger taps, normal hand movements, normal rapid alternating patting, normal foot taps  and normal foot agility.  Cerebellar testing: No dysmetria or intention tremor on finger to nose testing. Heel to shin is unremarkable bilaterally. There is no truncal or gait ataxia.  Sensory exam: intact to light touch in the upper and lower extremities.  Gait, station and balance: She stands easily. No veering to one side is noted. No leaning to one side is noted. Posture is age-appropriate and stance is narrow based. Gait shows normal stride length and normal pace. No problems turning are noted. Tandem walk is unremarkable.      Assessment and Plan:  In summary, Makaylie Dedeaux is a very pleasant 31 y.o.-year old female with an underlying medical history of migraines, vitamin D deficiency and obesity, whose history and physical exam are concerning for obstructive sleep apnea (OSA), due to obesity, borderline neck circumference, recurrent headaches,  especially morning headaches and daytime tiredness.. I had a long chat with the patient about my findings and the diagnosis of OSA, its prognosis and treatment options. We talked about medical treatments, surgical interventions and non-pharmacological approaches. I explained in particular the risks and ramifications of untreated moderate to severe OSA, especially with respect to developing cardiovascular disease down the Road, including congestive heart failure, difficult to treat hypertension, cardiac arrhythmias, or stroke. Even type 2 diabetes has, in part, been linked to untreated OSA. Symptoms of untreated OSA include daytime sleepiness, memory problems, mood irritability and mood disorder such as depression and anxiety, lack of energy, as well as recurrent headaches, especially morning headaches. We talked about trying to maintain a healthy lifestyle in general, as well as the importance of weight control. I encouraged the patient to eat healthy, exercise daily and keep well hydrated, to keep a scheduled bedtime and wake time routine, to not skip any meals and eat healthy snacks in between meals. I advised the patient not to drive when feeling sleepy. I recommended the following at this time: sleep study with potential positive airway pressure titration. (We will score hypopneas at 3%).   I explained the sleep test procedure to the patient and also outlined possible surgical and non-surgical treatment options of OSA, including the use of a custom-made dental device (which would require a referral to a specialist dentist or oral surgeon), upper airway surgical options, such as pillar implants, radiofrequency surgery, tongue base surgery, and UPPP (which would involve a referral to an ENT surgeon). Rarely, jaw surgery such as mandibular advancement may be considered.  I also explained the CPAP treatment option to the patient, who indicated that she would be willing to try CPAP if the need arises. I  explained the importance of being compliant with PAP treatment, not only for insurance purposes but primarily to improve Her symptoms, and for the patient's long term health benefit, including to reduce Her cardiovascular risks. I answered all her questions today and the patient was in agreement. I would like to see her back after the sleep study is completed and encouraged her to call with any interim questions, concerns, problems or updates.  She is advised to talk with you about optimizing her migraine prevention and abortive management, she has been taking quite a few pills of Maxalt per week, does not believe the Topamax at the current dose has been helpful. She is encouraged to email you or call you with her concerns. Thank you very much for allowing me to participate in the care of this nice patient. If I can be of any further assistance to you please do not hesitate  to talk to me.  Sincerely,   Star Age, MD, PhD

## 2016-05-08 NOTE — Telephone Encounter (Signed)
Pharmacy change to [facility] on [address] per pts request.

## 2016-05-08 NOTE — Patient Instructions (Signed)

## 2016-05-08 NOTE — Telephone Encounter (Signed)
Pt said her pharmacy is now [facility] on [address] call back is [phone]

## 2016-05-11 NOTE — Progress Notes (Signed)
Personally  participated in, made any corrections needed, and agree with history, physical, neuro exam,assessment and plan as stated above.    Dijon Kohlman, MD [facility] 

## 2016-05-28 NOTE — Patient Instructions (Signed)
Your procedure is scheduled on:  Tuesday, June 10, 2016  Enter through the Main Entrance of [facility] at:  6:00 AM  Pick up the phone at the desk and dial [phone].  Call this number if you have problems the morning of surgery: [phone].  Remember: Do NOT eat food or drink after:  Midnight Monday  Take these medicines the morning of surgery with a SIP OF WATER:  Topiramate  Stop ALL herbal medications and Ibuprofen at this time  Do NOT smoke the day of surgery.  Do NOT wear jewelry (body piercing), metal hair clips/bobby pins, make-up, or nail polish. Do NOT wear lotions, powders, or perfumes.  You may wear deodorant. Do NOT shave for 48 hours prior to surgery. Do NOT bring valuables to the hospital. Contacts, dentures, or bridgework may not be worn into surgery.  Leave suitcase in car.  After surgery it may be brought to your room.  For patients admitted to the hospital, checkout time is 11:00 AM the day of discharge.  Bring a copy of your healthcare power of attorney and living will documents.

## 2016-05-29 ENCOUNTER — Encounter (HOSPITAL_COMMUNITY)
Admission: RE | Admit: 2016-05-29 | Discharge: 2016-05-29 | Disposition: A | Discharge status: Home / Self Care | Payer: BLUE CROSS/BLUE SHIELD | Source: Ambulatory Visit | Attending: Obstetrics & Gynecology | Admitting: Obstetrics & Gynecology

## 2016-05-29 ENCOUNTER — Encounter (HOSPITAL_COMMUNITY): Payer: Self-pay

## 2016-05-29 DIAGNOSIS — Z01812 Encounter for preprocedural laboratory examination: Secondary | ICD-10-CM | POA: Insufficient documentation

## 2016-05-29 HISTORY — DX: Anemia, unspecified: D64.9

## 2016-05-29 LAB — CBC
HCT: 31.9 % — ABNORMAL LOW (ref 36.0–46.0)
Hemoglobin: 10.1 g/dL — ABNORMAL LOW (ref 12.0–15.0)
MCH: 21.1 pg — AB (ref 26.0–34.0)
MCHC: 31.7 g/dL (ref 30.0–36.0)
MCV: 66.6 fL — ABNORMAL LOW (ref 78.0–100.0)
PLATELETS: 239 10*3/uL (ref 150–400)
RBC: 4.79 MIL/uL (ref 3.87–5.11)
RDW: 17.7 % — ABNORMAL HIGH (ref 11.5–15.5)
WBC: 7 10*3/uL (ref 4.0–10.5)

## 2016-05-29 LAB — COMPREHENSIVE METABOLIC PANEL
ALBUMIN: 3.8 g/dL (ref 3.5–5.0)
ALT: 16 U/L (ref 14–54)
ANION GAP: 3 — AB (ref 5–15)
AST: 15 U/L (ref 15–41)
Alkaline Phosphatase: 61 U/L (ref 38–126)
BUN: 10 mg/dL (ref 6–20)
CHLORIDE: 108 mmol/L (ref 101–111)
CO2: 26 mmol/L (ref 22–32)
Calcium: 8.7 mg/dL — ABNORMAL LOW (ref 8.9–10.3)
Creatinine, Ser: 0.77 mg/dL (ref 0.44–1.00)
GFR calc non Af Amer: >60 mL/min (ref >60)
Glucose, Bld: 98 mg/dL (ref 65–99)
Potassium: 3.6 mmol/L (ref 3.5–5.1)
SODIUM: 137 mmol/L (ref 135–145)
Total Bilirubin: 0.6 mg/dL (ref 0.3–1.2)
Total Protein: 7.9 g/dL (ref 6.5–8.1)

## 2016-05-29 LAB — TYPE AND SCREEN
ABO/RH(D): A POS
ANTIBODY SCREEN: NEGATIVE

## 2016-05-29 LAB — ABO/RH: ABO/RH(D): A POS

## 2016-06-02 ENCOUNTER — Ambulatory Visit (INDEPENDENT_AMBULATORY_CARE_PROVIDER_SITE_OTHER): Payer: BLUE CROSS/BLUE SHIELD | Admitting: Neurology

## 2016-06-02 DIAGNOSIS — G4710 Hypersomnia, unspecified: Secondary | ICD-10-CM

## 2016-06-02 DIAGNOSIS — R400 Somnolence: Secondary | ICD-10-CM

## 2016-06-02 DIAGNOSIS — R0683 Snoring: Secondary | ICD-10-CM

## 2016-06-02 DIAGNOSIS — G4720 Circadian rhythm sleep disorder, unspecified type: Secondary | ICD-10-CM

## 2016-06-08 NOTE — H&P (Signed)
Maria Osborn is an 31 y.o. female with symptomatic uterine leiomyomata.  She has heavy, painful periods and desires future fertility.  Ultrasound 04/2016 showed endometrial density and at least three subserosal fibroids measuring 3.5 to 8 cm.    Pertinent Gynecological History: Menses: flow is excessive with use of 8 pads or tampons on heaviest days and with severe dysmenorrhea Bleeding: regular Contraception: none DES exposure: unknown Blood transfusions: none Sexually transmitted diseases: no past history Previous GYN Procedures: none  Last mammogram: n/a Date: n/a Last pap: normal Date: 2016 OB History: G0   Menstrual History: Menarche age: n/a Patient's last menstrual period was 05/13/2016 (approximate).    Past Medical History:  Diagnosis Date  . Anemia   . Fibroids   . Headache     Past Surgical History:  Procedure Laterality Date  . NO PAST SURGERIES      Family History  Problem Relation Age of Onset  . Migraines Father     Social History:  reports that she has never smoked. She has never used smokeless tobacco. She reports that she drinks alcohol. She reports that she does not use drugs.  Allergies: No Known Allergies  No prescriptions prior to admission.    ROS  Last menstrual period 05/13/2016. Physical Exam  Constitutional: She is oriented to person, place, and time. She appears well-developed and well-nourished.  GI: Soft. There is no rebound and no guarding.  Neurological: She is alert and oriented to person, place, and time.  Skin: Skin is warm and dry.  Psychiatric: She has a normal mood and affect. Her behavior is normal.    No results found for this or any previous visit (from the past 24 hour(s)).  No results found.  Assessment/Plan: 31yo G0 with symptomatic uterine leiomyomata, endometrial density -H/S, D&C, possible myosure, abdominal myomectomy -The patient has been counseled re: risk of bleeding, infection, scarring and damage to  surrounding structures.  She understands the risk of recurrence of fibroids with possible need for additional surgery.  She also understands the need for C/S 37-38 weeks after the procedure.  All questions were answered and the patient wishes to proceed.  Nasha Diss, [place] 06/08/2016, 8:28 PM

## 2016-06-09 ENCOUNTER — Encounter (HOSPITAL_COMMUNITY): Payer: Self-pay | Admitting: Anesthesiology

## 2016-06-09 NOTE — Anesthesia Preprocedure Evaluation (Addendum)
Anesthesia Evaluation  Patient identified by MRN, date of birth, ID band Patient awake    Reviewed: Allergy & Precautions, NPO status , Patient's Chart, lab work & pertinent test results  Airway Mallampati: II       Dental no notable dental hx.    Pulmonary neg pulmonary ROS,    Pulmonary exam normal        Cardiovascular negative cardio ROS Normal cardiovascular exam Rhythm:Regular Rate:Normal     Neuro/Psych negative psych ROS   GI/Hepatic negative GI ROS, Neg liver ROS,   Endo/Other  Morbid obesity  Renal/GU negative Renal ROS     Musculoskeletal negative musculoskeletal ROS (+)   Abdominal (+) + obese,   Peds  Hematology   Anesthesia Other Findings   Reproductive/Obstetrics negative OB ROS                            Anesthesia Physical Anesthesia Plan  ASA: III  Anesthesia Plan: General   Post-op Pain Management:    Induction: Intravenous  Airway Management Planned: Oral ETT  Additional Equipment:   Intra-op Plan:   Post-operative Plan: Extubation in OR  Informed Consent: I have reviewed the patients History and Physical, chart, labs and discussed the procedure including the risks, benefits and alternatives for the proposed anesthesia with the patient or authorized representative who has indicated his/her understanding and acceptance.   Dental advisory given  Plan Discussed with: CRNA and Surgeon  Anesthesia Plan Comments:        Anesthesia Quick Evaluation

## 2016-06-10 ENCOUNTER — Encounter (HOSPITAL_COMMUNITY)
Admission: RE | Disposition: A | Discharge status: Home / Self Care | Payer: Self-pay | Source: Ambulatory Visit | Attending: Obstetrics & Gynecology

## 2016-06-10 ENCOUNTER — Observation Stay (HOSPITAL_COMMUNITY)
Admission: RE | Admit: 2016-06-10 | Discharge: 2016-06-12 | Disposition: A | Discharge status: Home / Self Care | Payer: BLUE CROSS/BLUE SHIELD | Source: Ambulatory Visit | Attending: Obstetrics & Gynecology | Admitting: Obstetrics & Gynecology

## 2016-06-10 ENCOUNTER — Encounter (HOSPITAL_COMMUNITY): Payer: Self-pay | Admitting: *Deleted

## 2016-06-10 ENCOUNTER — Inpatient Hospital Stay (HOSPITAL_COMMUNITY): Payer: BLUE CROSS/BLUE SHIELD | Admitting: Anesthesiology

## 2016-06-10 DIAGNOSIS — Z79899 Other long term (current) drug therapy: Secondary | ICD-10-CM | POA: Diagnosis not present

## 2016-06-10 DIAGNOSIS — D252 Subserosal leiomyoma of uterus: Secondary | ICD-10-CM | POA: Diagnosis not present

## 2016-06-10 DIAGNOSIS — R938 Abnormal findings on diagnostic imaging of other specified body structures: Secondary | ICD-10-CM | POA: Insufficient documentation

## 2016-06-10 DIAGNOSIS — Z9889 Other specified postprocedural states: Secondary | ICD-10-CM

## 2016-06-10 DIAGNOSIS — Z6841 Body Mass Index (BMI) 40.0 and over, adult: Secondary | ICD-10-CM | POA: Insufficient documentation

## 2016-06-10 DIAGNOSIS — D649 Anemia, unspecified: Secondary | ICD-10-CM | POA: Insufficient documentation

## 2016-06-10 HISTORY — PX: HYSTEROSCOPY WITH D & C: SHX1775

## 2016-06-10 HISTORY — PX: MYOMECTOMY: SHX85

## 2016-06-10 LAB — PREGNANCY, URINE: Preg Test, Ur: NEGATIVE

## 2016-06-10 SURGERY — MYOMECTOMY, ABDOMINAL APPROACH
Anesthesia: General | Site: Uterus

## 2016-06-10 MED ORDER — FENTANYL CITRATE (PF) 100 MCG/2ML IJ SOLN
INTRAMUSCULAR | Status: DC | PRN
  Administered 2016-06-10: 25 ug via INTRAVENOUS
  Administered 2016-06-10: 50 ug via INTRAVENOUS
  Administered 2016-06-10 (×2): 25 ug via INTRAVENOUS
  Administered 2016-06-10: 50 ug via INTRAVENOUS
  Administered 2016-06-10: 100 ug via INTRAVENOUS
  Administered 2016-06-10: 50 ug via INTRAVENOUS

## 2016-06-10 MED ORDER — SCOPOLAMINE 1 MG/3DAYS TD PT72
MEDICATED_PATCH | TRANSDERMAL | Status: AC
  Administered 2016-06-10: 1.5 mg via TRANSDERMAL
  Filled 2016-06-10: qty 1

## 2016-06-10 MED ORDER — SUGAMMADEX SODIUM 500 MG/5ML IV SOLN
INTRAVENOUS | Status: DC | PRN
  Administered 2016-06-10: 200 mg via INTRAVENOUS

## 2016-06-10 MED ORDER — SUGAMMADEX SODIUM 200 MG/2ML IV SOLN
INTRAVENOUS | Status: AC
  Filled 2016-06-10: qty 2

## 2016-06-10 MED ORDER — ONDANSETRON HCL 4 MG/2ML IJ SOLN
INTRAMUSCULAR | Status: AC
  Filled 2016-06-10: qty 2

## 2016-06-10 MED ORDER — HYDROMORPHONE HCL 1 MG/ML IJ SOLN
INTRAMUSCULAR | Status: AC
  Administered 2016-06-10: 0.5 mg via INTRAVENOUS
  Filled 2016-06-10: qty 1

## 2016-06-10 MED ORDER — MIDAZOLAM HCL 2 MG/2ML IJ SOLN
INTRAMUSCULAR | Status: DC | PRN
  Administered 2016-06-10: 2 mg via INTRAVENOUS

## 2016-06-10 MED ORDER — PROPOFOL 10 MG/ML IV BOLUS
INTRAVENOUS | Status: AC
  Filled 2016-06-10: qty 20

## 2016-06-10 MED ORDER — SCOPOLAMINE 1 MG/3DAYS TD PT72
1.0000 | MEDICATED_PATCH | Freq: Once | TRANSDERMAL | Status: DC
Start: 2016-06-10 — End: 2016-06-10
  Administered 2016-06-10: 1.5 mg via TRANSDERMAL

## 2016-06-10 MED ORDER — CEFAZOLIN SODIUM-DEXTROSE 2-4 GM/100ML-% IV SOLN
2.0000 g | INTRAVENOUS | Status: AC
  Administered 2016-06-10: 2 g via INTRAVENOUS

## 2016-06-10 MED ORDER — PROMETHAZINE HCL 25 MG/ML IJ SOLN: 6.2500 mg | INTRAMUSCULAR | Status: DC | PRN

## 2016-06-10 MED ORDER — ROCURONIUM BROMIDE 100 MG/10ML IV SOLN
INTRAVENOUS | Status: AC
  Filled 2016-06-10: qty 1

## 2016-06-10 MED ORDER — HYDROMORPHONE HCL 1 MG/ML IJ SOLN
0.2000 mg | INTRAMUSCULAR | Status: DC | PRN
  Administered 2016-06-10: 0.6 mg via INTRAVENOUS
  Filled 2016-06-10: qty 1

## 2016-06-10 MED ORDER — LIDOCAINE HCL (CARDIAC) 20 MG/ML IV SOLN
INTRAVENOUS | Status: DC | PRN
  Administered 2016-06-10: 100 mg via INTRAVENOUS

## 2016-06-10 MED ORDER — PROPOFOL 10 MG/ML IV BOLUS
INTRAVENOUS | Status: DC | PRN
  Administered 2016-06-10: 200 mg via INTRAVENOUS
  Administered 2016-06-10: 40 mg via INTRAVENOUS

## 2016-06-10 MED ORDER — FENTANYL CITRATE (PF) 250 MCG/5ML IJ SOLN
INTRAMUSCULAR | Status: AC
  Filled 2016-06-10: qty 5

## 2016-06-10 MED ORDER — HYDROMORPHONE HCL 1 MG/ML IJ SOLN
0.2500 mg | INTRAMUSCULAR | Status: DC | PRN
  Administered 2016-06-10 (×2): 0.5 mg via INTRAVENOUS

## 2016-06-10 MED ORDER — LIDOCAINE HCL (CARDIAC) 20 MG/ML IV SOLN
INTRAVENOUS | Status: AC
  Filled 2016-06-10: qty 5

## 2016-06-10 MED ORDER — MIDAZOLAM HCL 2 MG/2ML IJ SOLN
INTRAMUSCULAR | Status: AC
  Filled 2016-06-10: qty 2

## 2016-06-10 MED ORDER — ACETAMINOPHEN 10 MG/ML IV SOLN
1000.0000 mg | Freq: Once | INTRAVENOUS | Status: DC | PRN
  Administered 2016-06-10: 1000 mg via INTRAVENOUS
  Filled 2016-06-10 (×2): qty 100

## 2016-06-10 MED ORDER — KETOROLAC TROMETHAMINE 30 MG/ML IJ SOLN
30.0000 mg | Freq: Four times a day (QID) | INTRAMUSCULAR | Status: DC
  Administered 2016-06-10 – 2016-06-11 (×4): 30 mg via INTRAVENOUS
  Filled 2016-06-10 (×4): qty 1

## 2016-06-10 MED ORDER — ONDANSETRON HCL 4 MG/2ML IJ SOLN
4.0000 mg | Freq: Four times a day (QID) | INTRAMUSCULAR | Status: DC | PRN
  Administered 2016-06-10: 4 mg via INTRAVENOUS
  Filled 2016-06-10: qty 2

## 2016-06-10 MED ORDER — ONDANSETRON HCL 4 MG PO TABS: 4.0000 mg | ORAL_TABLET | Freq: Four times a day (QID) | ORAL | Status: DC | PRN

## 2016-06-10 MED ORDER — ONDANSETRON HCL 4 MG/2ML IJ SOLN
INTRAMUSCULAR | Status: DC | PRN
  Administered 2016-06-10: 4 mg via INTRAVENOUS

## 2016-06-10 MED ORDER — DEXAMETHASONE SODIUM PHOSPHATE 10 MG/ML IJ SOLN
INTRAMUSCULAR | Status: DC | PRN
  Administered 2016-06-10: 10 mg via INTRAVENOUS

## 2016-06-10 MED ORDER — FLUMAZENIL 0.5 MG/5ML IV SOLN
INTRAVENOUS | Status: DC | PRN
  Administered 2016-06-10: 0.2 mg via INTRAVENOUS

## 2016-06-10 MED ORDER — GLYCOPYRROLATE 0.2 MG/ML IJ SOLN
INTRAMUSCULAR | Status: AC
  Filled 2016-06-10: qty 1

## 2016-06-10 MED ORDER — FENTANYL CITRATE (PF) 100 MCG/2ML IJ SOLN
INTRAMUSCULAR | Status: AC
  Filled 2016-06-10: qty 2

## 2016-06-10 MED ORDER — LACTATED RINGERS IV SOLN
INTRAVENOUS | Status: DC
  Administered 2016-06-10: 13:00:00 via INTRAVENOUS
  Administered 2016-06-10 – 2016-06-11 (×2): 125 mL/h via INTRAVENOUS

## 2016-06-10 MED ORDER — OXYCODONE-ACETAMINOPHEN 5-325 MG PO TABS
1.0000 | ORAL_TABLET | ORAL | Status: DC | PRN
  Administered 2016-06-10: 2 via ORAL
  Administered 2016-06-11 (×4): 1 via ORAL
  Filled 2016-06-10 (×4): qty 1
  Filled 2016-06-10: qty 2

## 2016-06-10 MED ORDER — SODIUM CHLORIDE 0.9 % IR SOLN
Status: DC | PRN
  Administered 2016-06-10: 1000 mL

## 2016-06-10 MED ORDER — DEXAMETHASONE SODIUM PHOSPHATE 10 MG/ML IJ SOLN
INTRAMUSCULAR | Status: AC
  Filled 2016-06-10: qty 1

## 2016-06-10 MED ORDER — HYDROMORPHONE HCL 1 MG/ML IJ SOLN
INTRAMUSCULAR | Status: DC | PRN
  Administered 2016-06-10: 1 mg via INTRAVENOUS

## 2016-06-10 MED ORDER — ROCURONIUM BROMIDE 100 MG/10ML IV SOLN
INTRAVENOUS | Status: DC | PRN
  Administered 2016-06-10: 50 mg via INTRAVENOUS

## 2016-06-10 MED ORDER — KETOROLAC TROMETHAMINE 30 MG/ML IJ SOLN
INTRAMUSCULAR | Status: DC | PRN
  Administered 2016-06-10: 30 mg via INTRAVENOUS

## 2016-06-10 MED ORDER — MEPERIDINE HCL 25 MG/ML IJ SOLN: 6.2500 mg | INTRAMUSCULAR | Status: DC | PRN

## 2016-06-10 MED ORDER — DOCUSATE SODIUM 100 MG PO CAPS
100.0000 mg | ORAL_CAPSULE | Freq: Two times a day (BID) | ORAL | Status: DC
  Administered 2016-06-10 – 2016-06-12 (×4): 100 mg via ORAL
  Filled 2016-06-10 (×4): qty 1

## 2016-06-10 MED ORDER — HYDROMORPHONE HCL 1 MG/ML IJ SOLN
INTRAMUSCULAR | Status: AC
  Filled 2016-06-10: qty 1

## 2016-06-10 MED ORDER — KETOROLAC TROMETHAMINE 30 MG/ML IJ SOLN
INTRAMUSCULAR | Status: AC
  Filled 2016-06-10: qty 1

## 2016-06-10 MED ORDER — PROPOFOL 10 MG/ML IV BOLUS
INTRAVENOUS | Status: AC
  Filled 2016-06-10: qty 40

## 2016-06-10 MED ORDER — LACTATED RINGERS IV SOLN
INTRAVENOUS | Status: DC
  Administered 2016-06-10 (×4): via INTRAVENOUS

## 2016-06-10 MED ORDER — KETOROLAC TROMETHAMINE 30 MG/ML IJ SOLN: 30.0000 mg | Freq: Four times a day (QID) | INTRAMUSCULAR | Status: DC

## 2016-06-10 MED ORDER — LIDOCAINE HCL 1 % IJ SOLN
INTRAMUSCULAR | Status: AC
  Filled 2016-06-10: qty 20

## 2016-06-10 MED ORDER — MENTHOL 3 MG MT LOZG
1.0000 | LOZENGE | OROMUCOSAL | Status: DC | PRN
  Administered 2016-06-11: 3 mg via ORAL
  Filled 2016-06-10: qty 9

## 2016-06-10 MED ORDER — SIMETHICONE 80 MG PO CHEW
80.0000 mg | CHEWABLE_TABLET | Freq: Four times a day (QID) | ORAL | Status: DC | PRN
  Administered 2016-06-11 – 2016-06-12 (×3): 80 mg via ORAL
  Filled 2016-06-10 (×3): qty 1

## 2016-06-10 SURGICAL SUPPLY — 44 items
BARRIER ADHS 3X4 INTERCEED (GAUZE/BANDAGES/DRESSINGS) IMPLANT
BENZOIN TINCTURE PRP APPL 2/3 (GAUZE/BANDAGES/DRESSINGS) ×5 IMPLANT
CANISTER SUCT 3000ML PPV (MISCELLANEOUS) ×5 IMPLANT
CATH ROBINSON RED A/P 16FR (CATHETERS) ×5 IMPLANT
CLOSURE WOUND 1/2 X4 (GAUZE/BANDAGES/DRESSINGS) ×1
CLOTH BEACON ORANGE TIMEOUT ST (SAFETY) ×5 IMPLANT
CONT PATH 16OZ SNAP LID 3702 (MISCELLANEOUS) ×5 IMPLANT
CONTAINER PREFILL 10% NBF 60ML (FORM) ×10 IMPLANT
DEVICE MYOSURE LITE (MISCELLANEOUS) IMPLANT
DEVICE MYOSURE REACH (MISCELLANEOUS) IMPLANT
DRAPE CESAREAN BIRTH W POUCH (DRAPES) ×5 IMPLANT
DURAPREP 26ML APPLICATOR (WOUND CARE) ×10 IMPLANT
ELECT REM PT RETURN 9FT ADLT (ELECTROSURGICAL) ×5
ELECTRODE REM PT RTRN 9FT ADLT (ELECTROSURGICAL) ×3 IMPLANT
FILTER ARTHROSCOPY CONVERTOR (FILTER) ×5 IMPLANT
GLOVE BIO SURGEON STRL SZ 6 (GLOVE) ×5 IMPLANT
GLOVE BIOGEL PI IND STRL 6 (GLOVE) ×6 IMPLANT
GLOVE BIOGEL PI IND STRL 7.0 (GLOVE) ×3 IMPLANT
GLOVE BIOGEL PI INDICATOR 6 (GLOVE) ×4
GLOVE BIOGEL PI INDICATOR 7.0 (GLOVE) ×2
GOWN STRL REUS W/TWL LRG LVL3 (GOWN DISPOSABLE) ×15 IMPLANT
HEMOSTAT ARISTA ABSORB 3G PWDR (MISCELLANEOUS) ×5 IMPLANT
NS IRRIG 1000ML POUR BTL (IV SOLUTION) ×5 IMPLANT
PACK ABDOMINAL GYN (CUSTOM PROCEDURE TRAY) ×5 IMPLANT
PACK VAGINAL MINOR WOMEN LF (CUSTOM PROCEDURE TRAY) ×5 IMPLANT
PAD OB MATERNITY 4.3X12.25 (PERSONAL CARE ITEMS) ×5 IMPLANT
PENCIL SMOKE EVAC W/HOLSTER (ELECTROSURGICAL) ×5 IMPLANT
PROTECTOR NERVE ULNAR (MISCELLANEOUS) ×5 IMPLANT
SEAL ROD LENS SCOPE MYOSURE (ABLATOR) ×5 IMPLANT
SPONGE LAP 18X18 X RAY DECT (DISPOSABLE) ×5 IMPLANT
STRIP CLOSURE SKIN 1/2X4 (GAUZE/BANDAGES/DRESSINGS) ×4 IMPLANT
SUT PDS AB 0 CT1 27 (SUTURE) IMPLANT
SUT PDS AB 3-0 SH 27 (SUTURE) IMPLANT
SUT PLAIN 2 0 XLH (SUTURE) ×5 IMPLANT
SUT VIC AB 0 CT1 18XCR BRD8 (SUTURE) ×6 IMPLANT
SUT VIC AB 0 CT1 36 (SUTURE) ×5 IMPLANT
SUT VIC AB 0 CT1 8-18 (SUTURE) ×4
SUT VIC AB 2-0 CT1 (SUTURE) ×5 IMPLANT
SUT VIC AB 4-0 KS 27 (SUTURE) ×5 IMPLANT
SUT VICRYL 0 TIES 12 18 (SUTURE) IMPLANT
TOWEL OR 17X24 6PK STRL BLUE (TOWEL DISPOSABLE) ×10 IMPLANT
TRAY FOLEY CATH SILVER 14FR (SET/KITS/TRAYS/PACK) ×5 IMPLANT
TUBING AQUILEX INFLOW (TUBING) ×5 IMPLANT
TUBING AQUILEX OUTFLOW (TUBING) ×5 IMPLANT

## 2016-06-10 NOTE — Transfer of Care (Signed)
Immediate Anesthesia Transfer of Care Note  Patient: Maria Osborn  Procedure(s) Performed: Procedure(s): MYOMECTOMY, abdominal (N/A) DILATATION & CURETTAGE/HYSTEROSCOPY (N/A)  Patient Location: PACU  Anesthesia Type:General  Level of Consciousness: awake and patient cooperative  Airway & Oxygen Therapy: Patient Spontanous Breathing and Patient connected to nasal cannula oxygen  Post-op Assessment: Report given to RN and Post -op Vital signs reviewed and stable  Post vital signs: Reviewed and stable  Last Vitals:  Vitals:   06/10/16 0612  BP: 137/75  Pulse: 90  Resp: 16  Temp: 36.8 C    Last Pain:  Vitals:   06/10/16 0612  TempSrc: Oral  PainSc: 5       Patients Stated Pain Goal: 5 (02/33/43 5686)  Complications: No apparent anesthesia complications

## 2016-06-10 NOTE — Addendum Note (Signed)
Addendum  created 06/10/16 1347 by Hewitt Blade, CRNA   Sign clinical note

## 2016-06-10 NOTE — Progress Notes (Signed)
No change to H&P.  Eyvonne Burchfield, DO 

## 2016-06-10 NOTE — Progress Notes (Signed)
No c/o. Tolerating clears.  Foley in.  No CP/SOB.  Pain and nausea well controlled.  VSS. AF. UOP clear and adequate. Gen: A&O x 3 Abd: soft, ND, dressing c/d Ext: no c/c/e  POD#0 s/p h/s, D&C, abdominal myomectomy -Continue pain and nausea control -D/C foley and IV in AM -AM labs pending -Encourage ambulation -Advance diet.  Linda Hedges, DO

## 2016-06-10 NOTE — Op Note (Signed)
Tien [name] PROCEDURE DATE: 06/10/2016  PREOPERATIVE DIAGNOSIS:  Symptomatic fibroids, heavy menstrual bleeding POSTOPERATIVE DIAGNOSIS:  SAA SURGEON:  Dr. Linda Hedges ASSISTANT:  Dr. Arvella Nigh OPERATION:  Hysteroscopy, D&C, Abdominal myomectomy ANESTHESIA:  General endotracheal.  INDICATIONS: The patient is a 31 y.o. G0 with history of symptomatic uterine fibroids/menorrhagia. Ultrasound showed endometrial density questionably representing a polyp in addition to multiple fibroids.  The patient made a decision to undergo surgical treatment. On the preoperative visit, the risks, benefits, indications, and alternatives of the procedure were reviewed with the patient.  On the day of surgery, the risks of surgery were again discussed with the patient including but not limited to: bleeding which may require transfusion or reoperation; infection which may require antibiotics; injury to bowel, bladder, ureters or other surrounding organs; need for additional procedures; thromboembolic phenomenon, incisional problems and other postoperative/anesthesia complications. Written informed consent was obtained.    OPERATIVE FINDINGS: A 8 week size uterus with pedunculated fibroid attached at left fundus and smaller fibroid in left broad ligament.  Normal tubes and ovaries bilaterally.  ESTIMATED BLOOD LOSS: 100 ml SPECIMENS:  Endometrial curetting and fibroids sent to pathology COMPLICATIONS:  None immediate.   DESCRIPTION OF PROCEDURE: The patient received intravenous antibiotics and had sequential compression devices applied to her lower extremities while in the preoperative area.   She was taken to the operating room and placed under general anesthesia without difficulty and found to be adequate.  She was placed in the dorsal lithotomy position and examined; then prepped and draped in the sterile manner.  Foley catheter was placed. A speculum was then placed in the patient's vagina and a single tooth  tenaculum was applied to the anterior lip of the cervix. The uteus was sounded to 10 cm and dilated manually with metal dilators to accommodate the 5.5 mm diagnostic hysteroscope.  Once the cervix was dilated, the hysteroscope was inserted under direct visualization using saline as a suspension medium.  The uterine cavity was carefully examined, both ostia were recognized.  Polypoid tissue was present on the anterior uterine wall.  Otherwise, entirely normal appearing cavity.  No intracavitary fibroids.  After further careful visualization of the uterine cavity, the hysteroscope was removed under direct visualization.  A sharp curettage was then performed to obtain a moderate amount of endometrial curettings.  The tenaculum was removed from the anterior lip of the cervix and the vaginal speculum was removed after noting good hemostasis.    Attention was then turned to the abdomen.  A sterile abdominal prep was performed and the patient was repositioned in the dorsal supine position.  The patient was redraped.  A Pfannensteil skin incision was made. This incision was taken down to the fascia using electrocautery with care given to maintain good hemostasis. The fascia was incised in the midline and the fascial incision was then extended bilaterally using electrocautery without difficulty. The fascia was then dissected off the underlying rectus muscles using blunt and sharp dissection. The rectus muscles were split bluntly in the midline and the peritoneum entered sharply without complication. This peritoneal incision was then extended superiorly and inferiorly with care given to prevent bowel or bladder injury. Upon entry into the abdominal cavity, the upper abdomen was inspected and found to be normal. Attention was then turned to the pelvis. The uterus at this point was noted to be mobilized and was exteriorized.  The pedunculated fibroid was clamped at the base and excised.  The pedicle was suture ligated to  hemostasis.  The fibroid located in the left broad ligament was grasped with a tenaculum and elevated.  The round ligament was dissected free from the fibroid as was the bladder.  The fibroid was removed.  Scant diffuse oozing was noted from the fibroid bed which was rendered hemostatic using Bovie cautery as well as Arista. Hemostasis was reconfirmed at all pedicles.  All laparotomy sponges and instruments were removed from the abdomen. The peritoneum was closed with 2-0 Vicryl, and the fascia was closed with 0 Vicryl in a running fashion. The subcutaneous layer was reapproximated with 2-0 plain gut. The skin was closed with a 4-0 Monocryl subcuticular stitch. Sponge, lap, needle, and instrument counts were correct times two. The patient was taken to the recovery area awake, extubated and in stable condition.

## 2016-06-10 NOTE — Anesthesia Procedure Notes (Signed)
Procedure Name: Intubation Date/Time: 06/10/2016 7:31 AM Performed by: Georgeanne Nim Pre-anesthesia Checklist: Patient identified, Emergency Drugs available, Suction available, Patient being monitored and Timeout performed Patient Re-evaluated:Patient Re-evaluated prior to inductionOxygen Delivery Method: Circle system utilized Preoxygenation: Pre-oxygenation with 100% oxygen Intubation Type: IV induction Ventilation: Mask ventilation without difficulty Grade View: Grade I Tube type: Oral Tube size: 7.0 mm Number of attempts: 1 Airway Equipment and Method: Stylet (sniffing on blankets for intubation) Placement Confirmation: ETT inserted through vocal cords under direct vision,  positive ETCO2,  CO2 detector and breath sounds checked- equal and bilateral Secured at: 20 cm Tube secured with: Tape Dental Injury: Teeth and Oropharynx as per pre-operative assessment

## 2016-06-10 NOTE — Addendum Note (Signed)
Addendum  created 06/10/16 1145 by Georgeanne Nim, CRNA   Anesthesia Event edited

## 2016-06-10 NOTE — Anesthesia Postprocedure Evaluation (Signed)
Anesthesia Post Note  Patient: Science writer  Procedure(s) Performed: Procedure(s) (LRB): MYOMECTOMY, abdominal (N/A) DILATATION & CURETTAGE/HYSTEROSCOPY (N/A)  Patient location during evaluation: PACU Anesthesia Type: General Level of consciousness: awake and sedated Pain management: pain level controlled Vital Signs Assessment: post-procedure vital signs reviewed and stable Respiratory status: spontaneous breathing Cardiovascular status: stable Postop Assessment: no signs of nausea or vomiting Anesthetic complications: no        Last Vitals:  Vitals:   06/10/16 1045 06/10/16 1046  BP:  119/69  Pulse: 97 93  Resp: 17 13  Temp:  36.8 C    Last Pain:  Vitals:   06/10/16 1046  TempSrc: Oral  PainSc:    Pain Goal: Patients Stated Pain Goal: 5 (06/10/16 0612)               Nazyia Gaugh JR,JOHN [facility]

## 2016-06-10 NOTE — Anesthesia Postprocedure Evaluation (Addendum)
Anesthesia Post Note  Patient: Science writer  Procedure(s) Performed: Procedure(s) (LRB): MYOMECTOMY, abdominal (N/A) DILATATION & CURETTAGE/HYSTEROSCOPY (N/A)  Patient location during evaluation: [facility] Anesthesia Type: General Level of consciousness: awake and alert and oriented Pain management: pain level controlled Vital Signs Assessment: post-procedure vital signs reviewed and stable Respiratory status: spontaneous breathing, nonlabored ventilation and patient connected to nasal cannula oxygen Cardiovascular status: stable Postop Assessment: no signs of nausea or vomiting and adequate PO intake Anesthetic complications: no        Last Vitals:  Vitals:   06/10/16 1115 06/10/16 1230  BP: 122/75 121/79  Pulse: 92 96  Resp: 18 16  Temp: 37.1 C 36.6 C    Last Pain:  Vitals:   06/10/16 1230  TempSrc: Oral  PainSc:    Pain Goal: Patients Stated Pain Goal: 5 (06/10/16 0612)               Jabier Mutton

## 2016-06-10 NOTE — Progress Notes (Signed)
Patient arrived to room 310 at 1115, stable , vitals WNL, no distress.

## 2016-06-11 ENCOUNTER — Ambulatory Visit: Payer: BLUE CROSS/BLUE SHIELD | Admitting: Nurse Practitioner

## 2016-06-11 ENCOUNTER — Telehealth: Payer: Self-pay

## 2016-06-11 ENCOUNTER — Encounter (HOSPITAL_COMMUNITY): Payer: Self-pay | Admitting: Obstetrics & Gynecology

## 2016-06-11 DIAGNOSIS — D252 Subserosal leiomyoma of uterus: Secondary | ICD-10-CM | POA: Diagnosis not present

## 2016-06-11 LAB — CBC
HEMATOCRIT: 28.7 % — AB (ref 36.0–46.0)
HEMOGLOBIN: 8.9 g/dL — AB (ref 12.0–15.0)
MCH: 20.7 pg — ABNORMAL LOW (ref 26.0–34.0)
MCHC: 31 g/dL (ref 30.0–36.0)
MCV: 66.9 fL — ABNORMAL LOW (ref 78.0–100.0)
Platelets: 225 10*3/uL (ref 150–400)
RBC: 4.29 MIL/uL (ref 3.87–5.11)
RDW: 18.4 % — ABNORMAL HIGH (ref 11.5–15.5)
WBC: 12.3 10*3/uL — ABNORMAL HIGH (ref 4.0–10.5)

## 2016-06-11 LAB — COMPREHENSIVE METABOLIC PANEL
ALBUMIN: 3.1 g/dL — AB (ref 3.5–5.0)
ALK PHOS: 50 U/L (ref 38–126)
ALT: 12 U/L — AB (ref 14–54)
AST: 16 U/L (ref 15–41)
Anion gap: 6 (ref 5–15)
BILIRUBIN TOTAL: 0.9 mg/dL (ref 0.3–1.2)
BUN: 8 mg/dL (ref 6–20)
CALCIUM: 8.4 mg/dL — AB (ref 8.9–10.3)
CO2: 22 mmol/L (ref 22–32)
CREATININE: 0.84 mg/dL (ref 0.44–1.00)
Chloride: 109 mmol/L (ref 101–111)
GFR calc Af Amer: >60 mL/min (ref >60)
GFR calc non Af Amer: >60 mL/min (ref >60)
Glucose, Bld: 101 mg/dL — ABNORMAL HIGH (ref 65–99)
Potassium: 3.5 mmol/L (ref 3.5–5.1)
SODIUM: 137 mmol/L (ref 135–145)
TOTAL PROTEIN: 6.5 g/dL (ref 6.5–8.1)

## 2016-06-11 MED ORDER — IBUPROFEN 800 MG PO TABS
800.0000 mg | ORAL_TABLET | Freq: Four times a day (QID) | ORAL | Status: DC | PRN
Start: 2016-06-11 — End: 2016-06-11
  Administered 2016-06-11: 800 mg via ORAL
  Filled 2016-06-11: qty 1

## 2016-06-11 MED ORDER — IBUPROFEN 800 MG PO TABS
800.0000 mg | ORAL_TABLET | Freq: Four times a day (QID) | ORAL | Status: DC
  Administered 2016-06-11 – 2016-06-12 (×3): 800 mg via ORAL
  Filled 2016-06-11 (×3): qty 1

## 2016-06-11 NOTE — Progress Notes (Signed)
Area of redness noted in pt left eye.  NO tenderness, swelling, discharge, or signs of trauma.  Will continue to monitor.  Rainn Zupko RN

## 2016-06-11 NOTE — Progress Notes (Signed)
Patient referred by Dr. Alma Downs, seen by me on 05/08/16, diagnostic PSG on 06/02/16.   Please call and notify the patient that the recent sleep study did not show any significant obstructive sleep apnea. There was mild, intermittent snoring, which may improve with weight loss and avoidance of the supine sleep position. Please inform patient that she can FU with Dr. Jaynee Eagles and/or CM.  Please remind patient to try to maintain good sleep hygiene, which means: Keep a regular sleep and wake schedule, try not to exercise or have a meal within 2 hours of your bedtime, try to keep your bedroom conducive for sleep, that is, cool and dark, without light distractors such as an illuminated alarm clock, and refrain from watching TV right before sleep or in the middle of the night and do not keep the TV or radio on during the night. Also, try not to use or play on electronic devices at bedtime, such as your cell phone, tablet PC or laptop. If you like to read at bedtime on an electronic device, try to dim the background light as much as possible. Do not eat in the middle of the night.  Once you have spoken to patient, you can close this encounter.   Thanks,  Star Age, MD, PhD [facility])

## 2016-06-11 NOTE — Telephone Encounter (Signed)
I called pt to discuss her sleep study results. No answer, left a message asking pt to call me back.

## 2016-06-11 NOTE — Telephone Encounter (Signed)
-----   Message from Star Age, MD sent at 06/11/2016  2:07 PM EDT ----- Patient referred by Dr. Alma Downs, seen by me on 05/08/16, diagnostic PSG on 06/02/16.   Please call and notify the patient that the recent sleep study did not show any significant obstructive sleep apnea. There was mild, intermittent snoring, which may improve with weight loss and avoidance of the supine sleep position. Please inform patient that she can FU with Dr. Jaynee Eagles and/or CM.  Please remind patient to try to maintain good sleep hygiene, which means: Keep a regular sleep and wake schedule, try not to exercise or have a meal within 2 hours of your bedtime, try to keep your bedroom conducive for sleep, that is, cool and dark, without light distractors such as an illuminated alarm clock, and refrain from watching TV right before sleep or in the middle of the night and do not keep the TV or radio on during the night. Also, try not to use or play on electronic devices at bedtime, such as your cell phone, tablet PC or laptop. If you like to read at bedtime on an electronic device, try to dim the background light as much as possible. Do not eat in the middle of the night.  Once you have spoken to patient, you can close this encounter.   Thanks,  Star Age, MD, PhD [facility])

## 2016-06-11 NOTE — Progress Notes (Signed)
c/o incisional pain.  Tolerating po.  Foley out around 7 am and no void yet.  No flatus.  VSS. AF.  Labs reviewed and appropriate Gen: A&O x 3 Abd: soft, ND, dressing c/d Ext: no c/c/e  POD#1 s/p laparotomy -Improve pain control -Ambulate -Likely d/c home in AM  Linda Hedges, DO

## 2016-06-11 NOTE — Procedures (Signed)
PATIENT'S NAME:  Maria Osborn, Maria Osborn DOB:      [birth year]      MR#:    [id number]     DATE OF RECORDING: 06/02/2016 REFERRING M.D.:  Cecille Rubin, NP/Dr. Rockwell [place] Performed:   Baseline Polysomnogram HISTORY: 31 year old woman with a history of migraines, vitamin D deficiency and obesity, who reports snoring, morning headache and daytime somnolence. Her Epworth sleepiness score is 11 out of 24, her fatigue score is 39 out of 63. The patient's weight 228 pounds with a height of 65 (inches), resulting in a BMI of 37.8 kg/m2. The patient's neck circumference measured 15.9 inches.  CURRENT MEDICATIONS: Ferrous Sulfate, Ondansetron, Rizatriptan, Topiramate and vitamin D   PROCEDURE:  This is a multichannel digital polysomnogram utilizing the Somnostar 11.2 system.  Electrodes and sensors were applied and monitored per AASM Specifications.   EEG, EOG, Chin and Limb EMG, were sampled at 200 Hz.  ECG, Snore and Nasal Pressure, Thermal Airflow, Respiratory Effort, CPAP Flow and Pressure, Oximetry was sampled at 50 Hz. Digital video and audio were recorded.      BASELINE STUDY  Lights Out was at 21:44 and Lights On at 05:01.  Total recording time (TRT) was 437.5 minutes, with a total sleep time (TST) of  394.5 minutes.   The patient's sleep latency was 36 minutes, which is mildly delayed.  REM latency was 66.5 minutes, which is borderline reduced.  The sleep efficiency was 90.2 %.     SLEEP ARCHITECTURE: WASO (Wake after sleep onset) was 18.5 minutes.  There were 9.5 minutes in Stage N1, 247.5 minutes Stage N2, 24.5 minutes Stage N3 and 113 minutes in Stage REM.  The percentage of Stage N1 was 2.4%, Stage N2 was 62.7%, which is increased, Stage N3 was 6.2%, which is mildly decreased, and Stage R (REM sleep) was 28.6%, which is mildly increased.   The arousals were noted as: 26 were spontaneous, 0 were associated with PLMs, 0 were associated with respiratory events.    Audio and video analysis did not show  any abnormal or unusual movements, behaviors, phonations or vocalizations.  The patient took no bathroom breaks. Mild intermittent snoring was noted. The EKG was in keeping with normal sinus rhythm (NSR).  RESPIRATORY ANALYSIS:  There were a total of 0 respiratory events:  0 obstructive apneas, 0 central apneas and 0 mixed apneas with a total of 0 apneas and an apnea index (AI) of 0 /hour. There were 0 hypopneas with a hypopnea index of 0 /hour. The patient also had 0 respiratory event related arousals (RERAs).      The total APNEA/HYPOPNEA INDEX (AHI) was 0/hour and the total RESPIRATORY DISTURBANCE INDEX was 0 /hour.  0 events occurred in REM sleep and 0 events in NREM. The REM AHI was 0 /hour, versus a non-REM AHI of 0. The patient spent 31.5 minutes of total sleep time in the supine position and 363 minutes in non-supine.. The supine AHI was 0.0 versus a non-supine AHI of 0.0.  OXYGEN SATURATION & C02:  The Wake baseline 02 saturation was 99%, with the lowest being 95%. Time spent below 89% saturation equaled 0 minutes.  PERIODIC LIMB MOVEMENTS: The patient had a total of 0 Periodic Limb Movements.  The Periodic Limb Movement (PLM) index was 0 and the PLM Arousal index was 0/hour.  Post-study, the patient indicated that sleep was the same as usual.   IMPRESSION:  1. Primary Snoring 2. Daytime somnolence  3. Dysfunctions associated with sleep stages or arousal  from sleep  RECOMMENDATIONS:  1. This study does not demonstrate any significant obstructive or central sleep disordered breathing. There was mild, intermittent snoring, which may improve with weight loss and avoidance of the supine sleep position. This study does not support an intrinsic sleep disorder as a cause of the patient's symptoms. Other causes, including circadian rhythm disturbances, an underlying mood disorder, medication effect and/or an underlying medical problem cannot be ruled out. 2. This study shows some sleep  fragmentation and mildly abnormal sleep stage percentages; these are nonspecific findings and per se do not signify an intrinsic sleep disorder or a cause for the patient's sleep-related symptoms. Causes include (but are not limited to) the first night effect of the sleep study, circadian rhythm disturbances, medication effect or an underlying mood disorder or medical problem.  3. The patient should be cautioned not to drive, work at heights, or operate dangerous or heavy equipment when tired or sleepy. Review and reiteration of good sleep hygiene measures should be pursued with any patient. 4. The patient can follow-up with her referring provider, who will be notified of the test results.  I certify that I have reviewed the entire raw data recording prior to the issuance of this report in accordance with the Standards of Accreditation of the American Academy of Sleep Medicine (AASM)    Star Age, MD, PhD Diplomat, American Board of Psychiatry and Neurology (Neurology and Sleep Medicine)

## 2016-06-12 DIAGNOSIS — D252 Subserosal leiomyoma of uterus: Secondary | ICD-10-CM | POA: Diagnosis not present

## 2016-06-12 MED ORDER — IBUPROFEN 800 MG PO TABS: 800.0000 mg | ORAL_TABLET | Freq: Four times a day (QID) | ORAL | 0 refills | Status: DC | PRN

## 2016-06-12 MED ORDER — OXYCODONE-ACETAMINOPHEN 5-325 MG PO TABS
1.0000 | ORAL_TABLET | ORAL | 0 refills | Status: DC | PRN
Start: 2016-06-12 — End: 2016-08-19

## 2016-06-12 NOTE — Progress Notes (Signed)
Pt out in wheelchair  Teaching complete   

## 2016-06-12 NOTE — Telephone Encounter (Signed)
Patient returning your call.

## 2016-06-12 NOTE — Discharge Instructions (Signed)
Call MD for T>100.4, heavy vaginal bleeding, severe abdominal pain, intractable nausea and/or vomiting, or respiratory distress.  Call office to schedule postop appointment in 1-2 weeks.  No driving while taking narcotics.  Pelvic rest x 4 weeks.

## 2016-06-12 NOTE — Discharge Summary (Signed)
Physician Discharge Summary  Patient ID: Maria Osborn MRN: [id number] DOB/AGE: 10/20/1985 31 y.o.  Admit date: 06/10/2016 Discharge date: 06/12/2016  Admission Diagnoses:  HMB, dysmenorrhea, fibroids  Discharge Diagnoses: SAA Active Problems:   S/P laparotomy   Discharged Condition: good  Hospital Course: The patient was admitted for treatment of the above.  She underwent an uncomplicated H/S, D&C and abdominal myomectomy.  The patient did well and was discharged home on POD#2.    Consults: None  Significant Diagnostic Studies: none  Treatments: surgery: Hysteroscopy, D&C, Abdominal myomectomy  Discharge Exam: Blood pressure 118/63, pulse 95, temperature 97.9 F (36.6 C), temperature source Oral, resp. rate 18, height 5\' 4"  (1.626 m), weight 232 lb 8 oz (105.5 kg), last menstrual period 05/13/2016, SpO2 100 %. General appearance: alert, cooperative and appears stated age Extremities: extremities normal, atraumatic, no cyanosis or edema Incision/Wound: c/d  Disposition: Final discharge disposition not confirmed   Allergies as of 06/12/2016   No Known Allergies     Medication List    STOP taking these medications   ondansetron 8 MG disintegrating tablet Commonly known as:  ZOFRAN ODT     TAKE these medications   fexofenadine 180 MG tablet Commonly known as:  ALLEGRA Take 180 mg by mouth daily as needed for allergies or rhinitis.   fluticasone 50 MCG/ACT nasal spray Commonly known as:  FLONASE Place 2 sprays into both nostrils daily as needed for allergies or rhinitis.   ibuprofen 800 MG tablet Commonly known as:  ADVIL Take 1 tablet (800 mg total) by mouth every 6 (six) hours as needed. What changed:  reasons to take this   IRON PO Take 1 tablet by mouth daily.   oxyCODONE-acetaminophen 5-325 MG tablet Commonly known as:  PERCOCET/ROXICET Take 1-2 tablets by mouth every 4 (four) hours as needed (moderate to severe pain (when tolerating fluids)).    rizatriptan 10 MG disintegrating tablet Commonly known as:  MAXALT-MLT Take 1 tablet (10 mg total) by mouth as needed for migraine. May repeat in 2 hours if needed   topiramate 50 MG tablet Commonly known as:  TOPAMAX Take 1 tablet (50 mg total) by mouth 2 (two) times daily.   Vitamin D (Ergocalciferol) 50000 units Caps capsule Commonly known as:  DRISDOL TAKE 49826 UNITS BY MOUTH EVERY WEEK ON MONDAY.        SignedLinda Hedges 06/12/2016, 8:14 AM

## 2016-06-12 NOTE — Telephone Encounter (Signed)
I called pt. I advised pt that Dr. Rexene Alberts reviewed pt's sleep study and found that pt does not have any significant sleep apnea. Pt did have mild, intermittent snoring. Dr. Rexene Alberts recommends that pt pursue weight loss (as long as this is not contraindicated by her other physicians) and avoiding supine sleep to help with snoring. I reviewed sleep hygiene recommendations with the pt, including trying to keep a regular sleep wake schedule, avoiding electronics in the bedroom, keeping the bedroom cool, dark, and quiet, and avoiding eating or exercising within 2 hours of bedtime as well as eating in the middle of the night. I advised pt that a copy of these sleep study results will be sent to Dr. Jaynee Eagles and Hoyle Sauer, NP and pt should follow up with Dr. Jaynee Eagles and Hoyle Sauer, NP as planned. Pt verbalized understanding of results. Pt had no questions at this time but was encouraged to call back if questions arise.

## 2016-06-12 NOTE — Progress Notes (Signed)
No current c/o.  Tolerating po.  Ambulating well.  Pain well controlled.  Voiding without difficulty.   VSS. AF. Gen: A&O x  3 Abd: soft, ND, dressing c/d. Ext: no c/c/e  POD#2 s/p laparotomy -Plan to d/c home today  Linda Hedges, DO

## 2016-06-13 ENCOUNTER — Encounter: Payer: Self-pay | Admitting: Nurse Practitioner

## 2016-07-10 ENCOUNTER — Ambulatory Visit: Payer: BLUE CROSS/BLUE SHIELD | Admitting: Nurse Practitioner

## 2016-07-11 ENCOUNTER — Encounter: Payer: Self-pay | Admitting: Nurse Practitioner

## 2016-07-23 ENCOUNTER — Other Ambulatory Visit (HOSPITAL_COMMUNITY): Payer: Self-pay | Admitting: Obstetrics & Gynecology

## 2016-07-23 DIAGNOSIS — G8918 Other acute postprocedural pain: Secondary | ICD-10-CM

## 2016-07-29 ENCOUNTER — Ambulatory Visit (HOSPITAL_COMMUNITY)
Admission: RE | Admit: 2016-07-29 | Discharge: 2016-07-29 | Disposition: A | Discharge status: Home / Self Care | Payer: BLUE CROSS/BLUE SHIELD | Source: Ambulatory Visit | Attending: Obstetrics & Gynecology | Admitting: Obstetrics & Gynecology

## 2016-07-29 DIAGNOSIS — G8918 Other acute postprocedural pain: Secondary | ICD-10-CM | POA: Insufficient documentation

## 2016-07-29 DIAGNOSIS — N83201 Unspecified ovarian cyst, right side: Secondary | ICD-10-CM | POA: Diagnosis not present

## 2016-07-29 MED ORDER — IOPAMIDOL (ISOVUE-300) INJECTION 61%
100.0000 mL | Freq: Once | INTRAVENOUS | Status: AC | PRN
  Administered 2016-07-29: 100 mL via INTRAVENOUS

## 2016-07-31 ENCOUNTER — Ambulatory Visit (HOSPITAL_COMMUNITY): Payer: BLUE CROSS/BLUE SHIELD

## 2016-08-08 NOTE — Addendum Note (Signed)
Addendum  created 08/08/16 1150 by Lyn Hollingshead, MD   Sign clinical note

## 2016-08-18 ENCOUNTER — Encounter: Payer: Self-pay | Admitting: Nurse Practitioner

## 2016-08-18 NOTE — Telephone Encounter (Signed)
Spoke to pt and made appt for tomorrow at 1545 to adjust medications as needed for migraines.

## 2016-08-18 NOTE — Telephone Encounter (Signed)
Pt does not have follow up appt scheduled.  Last 07-10-16 was a no show (not feeling well - cancelled).

## 2016-08-19 ENCOUNTER — Encounter: Payer: Self-pay | Admitting: *Deleted

## 2016-08-19 ENCOUNTER — Encounter: Payer: Self-pay | Admitting: Nurse Practitioner

## 2016-08-19 ENCOUNTER — Other Ambulatory Visit: Payer: Self-pay | Admitting: *Deleted

## 2016-08-19 ENCOUNTER — Ambulatory Visit (INDEPENDENT_AMBULATORY_CARE_PROVIDER_SITE_OTHER): Payer: BLUE CROSS/BLUE SHIELD | Admitting: Nurse Practitioner

## 2016-08-19 VITALS — BP 138/86 | HR 92 | Ht 65.0 in | Wt 235.2 lb

## 2016-08-19 DIAGNOSIS — G43709 Chronic migraine without aura, not intractable, without status migrainosus: Secondary | ICD-10-CM | POA: Diagnosis not present

## 2016-08-19 MED ORDER — TOPIRAMATE 50 MG PO TABS: ORAL_TABLET | ORAL | 3 refills | Status: DC

## 2016-08-19 MED ORDER — SUMATRIPTAN SUCCINATE 100 MG PO TABS: 100.0000 mg | ORAL_TABLET | ORAL | 3 refills | Status: DC | PRN

## 2016-08-19 NOTE — Progress Notes (Signed)
Pt here for appt.   Order for Depacon 500mg  IV rapid infusion.  IV 22g inserted to R arm, with good blood return.   Started Depacon at 1642 and finished 1647. Level 7 to level 5. Order for Solumedrol 500mg  IV started 1653 to to 1723.  Tolerated well.  IV discontinued.  Bandage applied.  Pt tolerated well.  Level to 5.  Hoyle Sauer NP saw pt and gave her AVS.  Pt verbalized understanding of instructions.  Depacon lot [id number] exp 06/2018, [place] [id number]. Solumedrol Lot [id number], Exp 01/2020.  [place] [id number].  Pt ambulated to her car.  Not for work given to her.

## 2016-08-19 NOTE — Progress Notes (Signed)
[facility]  PATIENT: Maria Osborn DOB: 05/08/1985   REASON FOR VISIT: follow up for worsening headaches , neck pain, new complaint of daytime drowsiness HISTORY FROM:patient    HISTORY OF PRESENT ILLNESS:UPDATE 06/19/2018CM Maria Osborn, 31 year old female returns for follow-up with worsening headaches. She has had a migraine for 6 days missed 2 days of work. She is currently taking Topamax 50 mg twice daily and Maxalt acutely. It is a level 7 today on a pain scale of 1-10. Her nausea went away but  bright lights are still bothersome. She has kept a record of her headaches and she had 8 for February 10 for March 23 for April 16 for May and so far 9 for June. Her sleep study after her last visit did not prove any significant sleep apnea. She does have mild intermittent snoring. She was advised to pursue weight loss and sleep hygiene habits She returns for reevaluation UPDATE 02/07/2018CM Maria Osborn, 31 year old female returns for follow-up with a long history of migraines which have worsened recently. She has had migraines since grade school.  Headaches are throbbing in the temples, can be occipital as well, she has light sensitivity, no sound sensitivity, no smell triggers, endorses that nausea and vomiting and dizziness acompany the symptoms. They can be 10/10 and she calls out of work and doesn't do anything but lay in a dark room. She has kept a record of her headaches since last seen in October, 4 for November 6 for December 14 for January and so far 3 for February. She woke with a headache this morning. Maxalt did not help. She does admit to snoring. She is not aware of any apnea events. She complains with significant daytime drowsiness. She is currently on Topamax 50 mg twice daily and her triptan is Maxalt melt. She also does not have consistent sleep-wake cycle. She returns for reevaluation  REVIEW OF SYSTEMS: Full 14 system review of systems performed and notable  only for those listed, all others are neg:  Constitutional: Fatigue  Cardiovascular: neg Ear/Nose/Throat: Ringing in the ears Skin: neg Eyes: Light sensitivity Respiratory: neg Gastroitestinal: neg  Hematology/Lymphatic: Anemia Endocrine: neg Musculoskeletal:neg Allergy/Immunology: neg Neurological: headache Psychiatric: neg Sleep : Daytime drowsiness   ALLERGIES: No Known Allergies  HOME MEDICATIONS: Outpatient Medications Prior to Visit  Medication Sig Dispense Refill  . fexofenadine (ALLEGRA) 180 MG tablet Take 180 mg by mouth daily as needed for allergies or rhinitis.    . fluticasone (FLONASE) 50 MCG/ACT nasal spray Place 2 sprays into both nostrils daily as needed for allergies or rhinitis.    Marland Kitchen ibuprofen (ADVIL,MOTRIN) 800 MG tablet Take 1 tablet (800 mg total) by mouth every 6 (six) hours as needed. 30 tablet 0  . IRON PO Take 1 tablet by mouth daily.    . rizatriptan (MAXALT-MLT) 10 MG disintegrating tablet Take 1 tablet (10 mg total) by mouth as needed for migraine. May repeat in 2 hours if needed 10 tablet 6  . topiramate (TOPAMAX) 50 MG tablet Take 1 tablet (50 mg total) by mouth 2 (two) times daily. 60 tablet 6  . Vitamin D, Ergocalciferol, (DRISDOL) 50000 UNITS CAPS capsule TAKE 37628 UNITS BY MOUTH EVERY WEEK ON MONDAY.  1  . oxyCODONE-acetaminophen (PERCOCET/ROXICET) 5-325 MG tablet Take 1-2 tablets by mouth every 4 (four) hours as needed (moderate to severe pain (when tolerating fluids)). (Patient not taking: Reported on 08/19/2016) 30 tablet 0   Facility-Administered Medications Prior to Visit  Medication Dose Route Frequency Provider  Last Rate Last Dose  . gadopentetate dimeglumine (MAGNEVIST) injection 20 mL  20 mL Intravenous Once PRN Melvenia Beam, MD        PAST MEDICAL HISTORY: Past Medical History:  Diagnosis Date  . Anemia   . Fibroids   . Headache     PAST SURGICAL HISTORY: Past Surgical History:  Procedure Laterality Date  . HYSTEROSCOPY  W/D&C N/A 06/10/2016   Procedure: DILATATION & CURETTAGE/HYSTEROSCOPY;  Surgeon: Linda Hedges, DO;  Location: [facility];  Service: Gynecology;  Laterality: N/A;  . MYOMECTOMY N/A 06/10/2016   Procedure: MYOMECTOMY, abdominal;  Surgeon: Linda Hedges, DO;  Location: [facility];  Service: Gynecology;  Laterality: N/A;  . NO PAST SURGERIES      FAMILY HISTORY: Family History  Problem Relation Age of Onset  . Migraines Father     SOCIAL HISTORY: Social History   Social History  . Marital status: Significant Other    Spouse name: N/A  . Number of children: 0  . Years of education: 39   Occupational History  . [employer]    Social History Main Topics  . Smoking status: Never Smoker  . Smokeless tobacco: Never Used  . Alcohol use 0.0 oz/week     Comment: Socially  . Drug use: No  . Sexual activity: Not on file   Other Topics Concern  . Not on file   Social History Narrative   Lives at home with boyfriend   Caffeine use: Drinks tea (4 cups per week)   No soda/coffee     PHYSICAL EXAM  Vitals:   08/19/16 1540  BP: 138/86  Pulse: 92  Weight: 235 lb 3.2 oz (106.7 kg)  Height: 5\' 5"  (1.651 m)   Body mass index is 39.14 kg/m. Generalized: Well developed, Obese female in no acute distress  Head: normocephalic and atraumatic,. Oropharynx benign  Neck: Supple, no carotid bruits  Cardiac: Regular rate rhythm, no murmur  Musculoskeletal: No deformity   Neurological examination   Mentation: Alert oriented to time, place, history taking. Attention span and concentration appropriate. Recent and remote memory intact. Follows all commands speech and language fluent.  Cranial nerve II-XII: Pupils were equal round reactive to light extraocular movements were full, visual field were full on confrontational test. Facial sensation and strength were normal. hearing was intact to finger rubbing bilaterally. Uvula tongue midline. head turning and shoulder shrug were normal and  symmetric.Tongue protrusion into cheek strength was normal. Motor: normal bulk and tone, full strength in the BUE, BLE, fine finger movements normal, no pronator drift. No focal weakness Sensory: normal and symmetric to light touch, pinprick, and Vibration, In the upper and lower extremities Coordination: finger-nose-finger, heel-to-shin bilaterally, no dysmetria Reflexes: Symmetric upper and lower, plantar responses were flexor bilaterally. Gait and Station: Rising up from seated position without assistance, normal stance, moderate stride, good arm swing, smooth turning, able to perform tiptoe, and heel walking without difficulty. Tandem gait is steady  DIAGNOSTIC DATA (LABS, IMAGING, TESTING)  ASSESSMENT AND PLAN  31 y.o. year old female  has a past medical history of Headache. which have worsened here to follow-up. Her Current headache has been going on for 6 days . Her headache record is 8 for February 10 for March 23 4 in April,  16 for May and so far 9 for June.     Increase   Topamax   50 mg am and 100mg  pm  Will change to Imitrex acutely Continue  neck exercises Continue to keep  a record of your headaches Will receive Depacon, Solu-Medrol IV office the day Follow-up in 3 mo I spent 25 minutes in  total face to face time with the patient more than 50% of which was spent counseling and coordination of care, reviewing test results reviewing medications and discussing and reviewing the diagnosis of migraine and importance of treating early in the cycle and not waiting so long to call.   Dennie Bible, [facility], [facility], APRN [facility] [address] [place], [place] [address] [phone]

## 2016-08-19 NOTE — Patient Instructions (Signed)
ncrease   Topamax   50 mg am and 100mg  pm  Will change to Imitrex acutely Continue  neck exercises Continue to keep a record of your headaches Will receive Depacon, Solu-Medrol IV office the day Follow-up in 3 mo

## 2016-08-20 ENCOUNTER — Encounter: Payer: Self-pay | Admitting: Nurse Practitioner

## 2016-08-21 ENCOUNTER — Telehealth: Payer: Self-pay | Admitting: Nurse Practitioner

## 2016-08-21 MED ORDER — PREDNISONE 10 MG PO TABS: ORAL_TABLET | ORAL | 0 refills | Status: DC

## 2016-08-21 NOTE — Telephone Encounter (Signed)
I called pt and LMVM for her that prescription called in for prednisone 6 day taper. Hopefully this works for her.  [facility] [place] and [facility].  To call back as needed.  Did receive STD, shredded, awaiting FMLA.

## 2016-08-21 NOTE — Telephone Encounter (Signed)
I woke up again this morning with a migraine around pain level 7/8. And, not able to function to get out of bed to go to work.This from [place]. Let her know I called in 6 day dose pack.

## 2016-08-22 ENCOUNTER — Encounter: Payer: Self-pay | Admitting: Nurse Practitioner

## 2016-08-25 ENCOUNTER — Encounter: Payer: Self-pay | Admitting: Nurse Practitioner

## 2016-09-01 NOTE — Telephone Encounter (Signed)
Signed and to MR to call pt for release and payment.

## 2016-09-01 NOTE — Telephone Encounter (Signed)
Received FMLA.  Completed and to CM/NP for signature.

## 2016-09-04 ENCOUNTER — Encounter: Payer: Self-pay | Admitting: Nurse Practitioner

## 2016-09-05 ENCOUNTER — Encounter: Payer: Self-pay | Admitting: Nurse Practitioner

## 2016-09-08 ENCOUNTER — Telehealth: Payer: Self-pay | Admitting: *Deleted

## 2016-09-08 ENCOUNTER — Encounter: Payer: Self-pay | Admitting: Nurse Practitioner

## 2016-09-08 NOTE — Telephone Encounter (Signed)
FMLA to MR 09-01-16 completed, reviewed and signed.

## 2016-09-08 NOTE — Telephone Encounter (Signed)
Replied to patient's my chart message and advised she call this office and ask to speak with D Settle in medical records to process her FMLA papers.  Also informed her C Hassell Done is out of the office, returns tomorrow. Advised this RN will make her aware of patient's headaches and other questions in her second my chart message: "Any other suggestions for the frequency of migraines and increase in pain again." Gave patient office number in my chart message.

## 2016-09-09 ENCOUNTER — Telehealth: Payer: Self-pay | Admitting: Neurology

## 2016-09-09 DIAGNOSIS — Z0289 Encounter for other administrative examinations: Secondary | ICD-10-CM

## 2016-09-09 NOTE — Telephone Encounter (Signed)
Delsa Sale, would you call and get Maria Osborn an appointment with me within the next week please for her worsening headaches? THANKS!!

## 2016-09-09 NOTE — Telephone Encounter (Signed)
The first available appt is 09/17/16 if we work her in at 4:30, is that alright?

## 2016-09-13 NOTE — Progress Notes (Signed)
Personally  participated in, made any corrections needed, and agree with history, physical, neuro exam,assessment and plan as stated.     Krystol Rocco, MD [facility]     

## 2016-09-15 NOTE — Telephone Encounter (Signed)
Called and spoke to pt. Sooner appt scheduled tomorrow at 4 p.m.

## 2016-09-16 ENCOUNTER — Encounter: Payer: Self-pay | Admitting: Neurology

## 2016-09-16 ENCOUNTER — Ambulatory Visit (INDEPENDENT_AMBULATORY_CARE_PROVIDER_SITE_OTHER): Payer: BLUE CROSS/BLUE SHIELD | Admitting: Neurology

## 2016-09-16 VITALS — BP 126/87 | HR 81 | Ht 65.0 in | Wt 232.6 lb

## 2016-09-16 DIAGNOSIS — G43709 Chronic migraine without aura, not intractable, without status migrainosus: Secondary | ICD-10-CM | POA: Diagnosis not present

## 2016-09-16 DIAGNOSIS — IMO0002 Reserved for concepts with insufficient information to code with codable children: Secondary | ICD-10-CM

## 2016-09-16 MED ORDER — DIHYDROERGOTAMINE MESYLATE 1 MG/ML IJ SOLN
1.0000 mg | Freq: Once | INTRAMUSCULAR | Status: AC
  Administered 2016-09-16: 0.5 mg via INTRAMUSCULAR

## 2016-09-16 MED ORDER — KETOROLAC TROMETHAMINE 60 MG/2ML IM SOLN
60.0000 mg | Freq: Once | INTRAMUSCULAR | Status: AC
  Administered 2016-09-16: 60 mg via INTRAMUSCULAR

## 2016-09-16 MED ORDER — METHYLPREDNISOLONE 4 MG PO TBPK: ORAL_TABLET | ORAL | 3 refills | Status: DC

## 2016-09-16 MED ORDER — ATENOLOL 25 MG PO TABS: 25.0000 mg | ORAL_TABLET | Freq: Every day | ORAL | 11 refills | Status: DC

## 2016-09-16 NOTE — Patient Instructions (Addendum)
Remember to drink plenty of fluid, eat healthy meals and do not skip any meals. Try to eat protein with a every meal and eat a healthy snack such as fruit or nuts in between meals. Try to keep a regular sleep-wake schedule and try to exercise daily, particularly in the form of walking, 20-30 minutes a day, if you can.   As far as your medications are concerned, I would like to suggest:  Increase Topiramate to 200mg  at night ER Qudexy Start Atenolol 25mg  a day Medrol dosepak  Our phone number is [phone]. We also have an after hours call service for urgent matters and there is a physician on-call for urgent questions. For any emergencies you know to call 911 or go to the nearest emergency room  Atenolol tablets What is this medicine? ATENOLOL (a TEN oh lole) is a beta-blocker. Beta-blockers reduce the workload on the heart and help it to beat more regularly. This medicine is used to treat high blood pressure and to prevent chest pain. It is also used to protect the heart during a heart attack and to prevent an additional heart attack from occurring. This medicine may be used for other purposes; ask your health care provider or pharmacist if you have questions. COMMON BRAND NAME(S): Tenormin What should I tell my health care provider before I take this medicine? They need to know if you have any of these conditions: -diabetes -heart or vessel disease like slow heart rate, worsening heart failure, heart block, sick sinus syndrome or Raynaud's disease -kidney disease -lung or breathing disease, like asthma or emphysema -pheochromocytoma -thyroid disease -an unusual or allergic reaction to atenolol, other beta-blockers, medicines, foods, dyes, or preservatives -pregnant or trying to get pregnant -breast-feeding How should I use this medicine? Take this medicine by mouth with a drink of water. Follow the directions on the prescription label. This medicine may be taken with or without food.  Take your medicine at regular intervals. Do not take more medicine than directed. Do not stop taking this medicine suddenly. This could lead to serious heart-related effects. Talk to your pediatrician regarding the use of this medicine in children. Special care may be needed. Overdosage: If you think you have taken too much of this medicine contact a poison control center or emergency room at once. NOTE: This medicine is only for you. Do not share this medicine with others. What if I miss a dose? If you miss a dose, take it as soon as you can. If it is almost time for your next dose, take only that dose. Do not take double or extra doses. What may interact with this medicine? This medicine may interact with the following medications: -certain medicines for blood pressure, heart disease, irregular heart beat -clonidine -digoxin -diuretics -dobutamine -epinephrine -isoproterenol -NSAIDs, medicines for pain and inflammation, like ibuprofen or naproxen -reserpine This list may not describe all possible interactions. Give your health care provider a list of all the medicines, herbs, non-prescription drugs, or dietary supplements you use. Also tell them if you smoke, drink alcohol, or use illegal drugs. Some items may interact with your medicine. What should I watch for while using this medicine? Visit your doctor or health care professional for regular check ups. Check your blood pressure and pulse rate regularly. Ask your health care professional what your blood pressure and pulse rate should be, and when you should contact him or her. You may get drowsy or dizzy. Do not drive, use machinery, or do anything that  needs mental alertness until you know how this medicine affects you. Do not stand or sit up quickly. Alcohol may interfere with the effect of this medicine. Avoid alcoholic drinks. This medicine can affect blood sugar levels. If you have diabetes, check with your doctor or health care  professional before you change your diet or the dose of your diabetic medicine. Do not treat yourself for coughs, colds, or pain while you are taking this medicine without asking your doctor or health care professional for advice. Some ingredients may increase your blood pressure. What side effects may I notice from receiving this medicine? Side effects that you should report to your doctor or health care professional as soon as possible: -allergic reactions like skin rash, itching or hives, swelling of the face, lips, or tongue -breathing problems -changes in vision -chest pain -cold, tingling, or numb hands or feet -depression -fast, irregular heartbeat -feeling faint or lightheaded, falls -fever with sore throat -rapid weight gain -swollen ankles, legs Side effects that usually do not require medical attention (report to your doctor or health care professional if they continue or are bothersome): -anxiety, nervous -diarrhea -dry skin -change in sex drive or performance -headache -nightmares or trouble sleeping -short term memory loss -stomach upset -unusually tired This list may not describe all possible side effects. Call your doctor for medical advice about side effects. You may report side effects to FDA at 1-800-FDA-1088. Where should I keep my medicine? Keep out of the reach of children. Store at room temperature between 20 and 25 degrees C (68 and 77 degrees F). Close tightly and protect from light. Throw away any unused medicine after the expiration date. NOTE: This sheet is a summary. It may not cover all possible information. If you have questions about this medicine, talk to your doctor, pharmacist, or health care provider.  2018 Elsevier/Gold Standard (2012-10-24 14:21:28)

## 2016-09-16 NOTE — Progress Notes (Signed)
[facility]    Provider:  Dr Jaynee Eagles Referring Provider: [facility] Primary Care Physician:  [facility]  CC:  Migraines  Interval history: Patient has used Trokendi in the past(50mg  qam and 100mg  qpm). She was switched to IR due to cost. MRi of the brain was completed and was normal. Sleep evaluation was negative for OSA.  Migraines worsened last fall. Frequency worsening. She has been having daily headaches since at least January. Starts unilaterally on the left in the temples and spreads. Throbbing and pounding. Light and sound sensitivity. Ringing in the ears. No nausea. Need a dark room. Sleeping helps. They can last all day long. No medication overuse. She has tried Topiramate and Verapamil. Prednisone and migraine cocktail helped but migraines came back.   Medications tried: Trokendi(worked, insurance would not pay), Verapamil, Onzetra (did not like), Maxalt, Imitrex po, prednisone,   HPI:  Maria Osborn is a 31 y.o. female here as a referral from Dr. Laverta Baltimore for migraines since grade school. The migraines are getting more frequent and more severe. Three times a week. When they occur she can't function, she take OTC medication which doesn't work. Imitrex and the oral triptans make her queasy. Headaches are throbbing in the temples, can be occipital as well, she has light sensitivity, no sound sensitivity, no smell triggers, endorses that nausea and vomiting and dizziness acompany the symptoms. They can be 10/10 and she calls out of work and doesn't do anything but lay in a dark room, she has to sit still. No medication overuse.They will last the whole entire day up to 24 hours. She is waking up with headaches. She snores. She has woken herself up snoring. No witnessed apneic events. She is fatigued during the days. Her father has migraines. She has tried verapamil in the past but topamax and others don't sound familiar. She has associated  hearing changes and swooshing in the ears. No aura.   Review of Systems: Patient complains of symptoms per HPI as well as the following symptoms: headche, weakness, confusion, dizziness, insomnia, not enough sleep, decreased energy, spinning sensation. Pertinent negatives per HPI. All others negative.  Social History   Social History  . Marital status: Significant Other    Spouse name: N/A  . Number of children: 0  . Years of education: 100   Occupational History  . [employer]    Social History Main Topics  . Smoking status: Never Smoker  . Smokeless tobacco: Never Used  . Alcohol use 0.0 oz/week     Comment: Socially  . Drug use: No  . Sexual activity: Not on file   Other Topics Concern  . Not on file   Social History Narrative   Lives at home with boyfriend   Caffeine use: Drinks tea (4 cups per week)   No soda/coffee    Family History  Problem Relation Age of Onset  . Migraines Father     Past Medical History:  Diagnosis Date  . Anemia   . Fibroids   . Headache     Past Surgical History:  Procedure Laterality Date  . HYSTEROSCOPY W/D&C N/A 06/10/2016   Procedure: DILATATION & CURETTAGE/HYSTEROSCOPY;  Surgeon: Linda Hedges, DO;  Location: [facility];  Service: Gynecology;  Laterality: N/A;  . MYOMECTOMY N/A 06/10/2016   Procedure: MYOMECTOMY, abdominal;  Surgeon: Linda Hedges, DO;  Location: [facility];  Service: Gynecology;  Laterality: N/A;    Current Outpatient Prescriptions  Medication Sig Dispense Refill  .  fexofenadine (ALLEGRA) 180 MG tablet Take 180 mg by mouth daily as needed for allergies or rhinitis.    . fluticasone (FLONASE) 50 MCG/ACT nasal spray Place 2 sprays into both nostrils daily as needed for allergies or rhinitis.    Marland Kitchen ibuprofen (ADVIL,MOTRIN) 800 MG tablet Take 1 tablet (800 mg total) by mouth every 6 (six) hours as needed. 30 tablet 0  . IRON PO Take 1 tablet by mouth daily.    . SUMAtriptan (IMITREX) 100 MG tablet Take 1 tablet (100  mg total) by mouth every 2 (two) hours as needed for migraine. May repeat in 2 hours if headache persists or recurs. 10 tablet 3  . topiramate (TOPAMAX) 50 MG tablet 1 in the am 2 in the pm 90 tablet 3  . Vitamin D, Ergocalciferol, (DRISDOL) 50000 UNITS CAPS capsule TAKE 16109 UNITS BY MOUTH EVERY WEEK ON MONDAY.  1   No current facility-administered medications for this visit.    Facility-Administered Medications Ordered in Other Visits  Medication Dose Route Frequency Provider Last Rate Last Dose  . gadopentetate dimeglumine (MAGNEVIST) injection 20 mL  20 mL Intravenous Once PRN Melvenia Beam, MD        Allergies as of 09/16/2016  . (No Known Allergies)    Vitals: BP 140/87   Pulse 97   Ht 5\' 5"  (1.651 m)   Wt 232 lb 9.6 oz (105.5 kg)   BMI 38.71 kg/m  Last Weight:  Wt Readings from Last 1 Encounters:  09/16/16 232 lb 9.6 oz (105.5 kg)   Last Height:   Ht Readings from Last 1 Encounters:  09/16/16 5\' 5"  (1.651 m)   Physical exam: Exam: Gen: NAD, conversant, well nourised, obese, well groomed                     CV: RRR, no MRG. No Carotid Bruits. No peripheral edema, warm, nontender Eyes: Conjunctivae clear without exudates or hemorrhage  Neuro: Detailed Neurologic Exam  Speech:    Speech is normal; fluent and spontaneous with normal comprehension.  Cognition:    The patient is oriented to person, place, and time;     recent and remote memory intact;     language fluent;     normal attention, concentration,     fund of knowledge Cranial Nerves:    The pupils are equal, round, and reactive to light. The fundi are normal and spontaneous venous pulsations are present. Visual fields are full to finger confrontation. Extraocular movements are intact. Trigeminal sensation is intact and the muscles of mastication are normal. The face is symmetric. The palate elevates in the midline. Hearing intact. Voice is normal. Shoulder shrug is normal. The tongue has normal motion  without fasciculations.   Coordination:    Normal finger to nose and heel to shin. Normal rapid alternating movements.   Gait:    Heel-toe and tandem gait are normal.   Motor Observation:    No asymmetry, no atrophy, and no involuntary movements noted. Tone:    Normal muscle tone.    Posture:    Posture is normal. normal erect    Strength:    Strength is V/V in the upper and lower limbs.      Sensation: intact to LT     Reflex Exam:  DTR's:    Deep tendon reflexes in the upper and lower extremities are normal bilaterally.   Toes:    The toes are downgoing bilaterally.   Clonus:  Clonus is absent.      Assessment/Plan:  31 year old with worsening chronic migraines.  As far as your medications are concerned, I would like to suggest:  Increase Topiramate to 200mg  at night ER Qudexy Start Atenolol 25mg  a day Medrol dosepak Botox or Aimovig are options in the future Provided Toradol and DHE injections today in clinic, patient headache declined from a 6/10 to a 1/10   Discussed: To prevent or relieve headaches, try the following: Cool Compress. Lie down and place a cool compress on your head.  Avoid headache triggers. If certain foods or odors seem to have triggered your migraines in the past, avoid them. A headache diary might help you identify triggers.  Include physical activity in your daily routine. Try a daily walk or other moderate aerobic exercise.  Manage stress. Find healthy ways to cope with the stressors, such as delegating tasks on your to-do list.  Practice relaxation techniques. Try deep breathing, yoga, massage and visualization.  Eat regularly. Eating regularly scheduled meals and maintaining a healthy diet might help prevent headaches. Also, drink plenty of fluids.  Follow a regular sleep schedule. Sleep deprivation might contribute to headaches Consider biofeedback. With this mind-body technique, you learn to control certain bodily functions - such  as muscle tension, heart rate and blood pressure - to prevent headaches or reduce headache pain.    Proceed to emergency room if you experience new or worsening symptoms or symptoms do not resolve, if you have new neurologic symptoms or if headache is severe, or for any concerning symptom.   Provided education and documentation from American headache Society toolbox including articles on: chronic migraine medication overuse headache, chronic migraines, prevention of migraines, behavioral and other nonpharmacologic treatments for headache.   Sarina Ill, MD  [facility] [address] [place], [place] [address]  Phone [phone] Fax [phone]  A total of 25 minutes was spent face-to-face with this patient. Over half this time was spent on counseling patient on the chronic migraine diagnosis and different diagnostic and therapeutic options available.

## 2016-09-18 ENCOUNTER — Other Ambulatory Visit: Payer: Self-pay | Admitting: Nurse Practitioner

## 2016-09-26 ENCOUNTER — Telehealth: Payer: Self-pay | Admitting: *Deleted

## 2016-09-26 NOTE — Telephone Encounter (Signed)
LVM informing patient that this RN received refill request from [facility] for topiramate. Advised her it was refilled x 1 yr in June to [facility] [address] patient to call back Monday to let RN know which pharmacy she is using. Advised her she has refills at [facility] until she returns call Monday.

## 2016-09-29 ENCOUNTER — Encounter: Payer: Self-pay | Admitting: Neurology

## 2016-10-15 ENCOUNTER — Encounter: Payer: Self-pay | Admitting: Neurology

## 2016-11-12 ENCOUNTER — Encounter: Payer: Self-pay | Admitting: Neurology

## 2016-11-13 ENCOUNTER — Other Ambulatory Visit: Payer: Self-pay | Admitting: Neurology

## 2016-11-13 MED ORDER — ATENOLOL 25 MG PO TABS: 25.0000 mg | ORAL_TABLET | Freq: Every day | ORAL | 11 refills | Status: DC

## 2016-12-10 ENCOUNTER — Encounter: Payer: Self-pay | Admitting: Neurology

## 2016-12-14 ENCOUNTER — Other Ambulatory Visit: Payer: Self-pay | Admitting: Neurology

## 2016-12-14 DIAGNOSIS — G43001 Migraine without aura, not intractable, with status migrainosus: Secondary | ICD-10-CM

## 2016-12-14 MED ORDER — ZOLMITRIPTAN 5 MG NA SOLN: 1.0000 | NASAL | 11 refills | Status: DC | PRN

## 2016-12-18 ENCOUNTER — Other Ambulatory Visit: Payer: Self-pay | Admitting: Neurology

## 2016-12-18 MED ORDER — BUTALBITAL-APAP-CAFFEINE 50-325-40 MG PO TABS: 1.0000 | ORAL_TABLET | Freq: Four times a day (QID) | ORAL | 3 refills | Status: DC | PRN

## 2016-12-19 ENCOUNTER — Other Ambulatory Visit: Payer: Self-pay | Admitting: *Deleted

## 2016-12-19 NOTE — Telephone Encounter (Signed)
Fioricet refill Rx successfully faxed to [facility].

## 2016-12-24 ENCOUNTER — Telehealth: Payer: Self-pay | Admitting: Neurology

## 2016-12-24 MED ORDER — FREMANEZUMAB-VFRM 225 MG/1.5ML ~~LOC~~ SOSY: 1.0000 "pen " | PREFILLED_SYRINGE | SUBCUTANEOUS | 11 refills | Status: DC

## 2016-12-24 NOTE — Addendum Note (Signed)
Addended by: Belinda Block A on: 12/24/2016 01:03 PM   Modules accepted: Orders

## 2016-12-24 NOTE — Telephone Encounter (Signed)
Would u prescribe ajovy please?

## 2016-12-24 NOTE — Telephone Encounter (Signed)
Rx for Ajovy has been sent to [facility].

## 2017-01-05 ENCOUNTER — Encounter: Payer: Self-pay | Admitting: Neurology

## 2017-01-19 ENCOUNTER — Encounter: Payer: Self-pay | Admitting: Neurology

## 2017-02-17 ENCOUNTER — Encounter: Payer: Self-pay | Admitting: Neurology

## 2017-02-17 ENCOUNTER — Encounter: Payer: Self-pay | Admitting: *Deleted

## 2017-02-17 ENCOUNTER — Telehealth: Payer: Self-pay | Admitting: Neurology

## 2017-02-17 NOTE — Telephone Encounter (Signed)
Maria Osborn, can you call pharmacy and provide them with an ajovy copay card? The pharmacy is not giving her the medication.  If they can;t take it that way we can mail it or have patient pick it up. In the meantime we can get her a sample. See email from patient below thanks   Hi Dr. Jaynee Eagles,    I don't have a copay card, only my HSA.    And, the pharmacy I use is [facility] [address] [facility]).     And, I was confused too, because I didn't have issues, first two months I got the Ajovi prescription. But, I did have to pay those times though.    Thank you for your help.

## 2017-02-17 NOTE — Telephone Encounter (Signed)
Emailed patient.

## 2017-02-18 ENCOUNTER — Telehealth: Payer: Self-pay | Admitting: *Deleted

## 2017-02-18 NOTE — Telephone Encounter (Addendum)
Difficulty completing the PA for Ajovy on Cover My Meds due to questions regarding the dose exceeding plan's limitations. Cover My Meds chat was unable to help and referred me to the plan. I called [facility] and they were unable to help. They referred me to another number [phone]. I spoke with Colletta [place], the representative and completed the PA for Ajovy over the phone. They will fax the determination to [place] expected in about 4-15 days.   PA # [id number]

## 2017-02-25 NOTE — Telephone Encounter (Signed)
PA for ajovy was denied by [facility]. Pt has been advised to pick up a ajvoy savings card at the front desk for a free year of ajovy.  Case #: [id number]

## 2017-04-16 ENCOUNTER — Other Ambulatory Visit: Payer: Self-pay | Admitting: Neurology

## 2017-04-22 NOTE — Telephone Encounter (Signed)
Faxed signed Fioricet prescription to [facility]. Received a receipt of confirmation.

## 2017-06-01 LAB — OB RESULTS CONSOLE GC/CHLAMYDIA
CHLAMYDIA, DNA PROBE: NEGATIVE
Gonorrhea: NEGATIVE

## 2017-06-01 LAB — OB RESULTS CONSOLE ABO/RH: RH Type: POSITIVE

## 2017-06-01 LAB — OB RESULTS CONSOLE RUBELLA ANTIBODY, IGM: Rubella: IMMUNE

## 2017-06-01 LAB — OB RESULTS CONSOLE ANTIBODY SCREEN: Antibody Screen: NEGATIVE

## 2017-06-01 LAB — OB RESULTS CONSOLE HEPATITIS B SURFACE ANTIGEN: Hepatitis B Surface Ag: NEGATIVE

## 2017-06-01 LAB — OB RESULTS CONSOLE RPR: RPR: NONREACTIVE

## 2017-06-01 LAB — OB RESULTS CONSOLE HIV ANTIBODY (ROUTINE TESTING): HIV: NONREACTIVE

## 2017-11-05 LAB — OB RESULTS CONSOLE GBS: GBS: NEGATIVE

## 2017-12-16 ENCOUNTER — Encounter (HOSPITAL_COMMUNITY): Payer: Self-pay | Admitting: *Deleted

## 2017-12-16 ENCOUNTER — Telehealth (HOSPITAL_COMMUNITY): Payer: Self-pay | Admitting: *Deleted

## 2017-12-16 NOTE — Telephone Encounter (Signed)
Preadmission screen  

## 2017-12-22 ENCOUNTER — Telehealth (HOSPITAL_COMMUNITY): Payer: Self-pay | Admitting: *Deleted

## 2017-12-22 NOTE — Telephone Encounter (Signed)
Preadmission screen  

## 2017-12-25 ENCOUNTER — Encounter (HOSPITAL_COMMUNITY): Payer: Self-pay

## 2017-12-25 ENCOUNTER — Other Ambulatory Visit: Payer: Self-pay

## 2017-12-25 ENCOUNTER — Inpatient Hospital Stay (HOSPITAL_COMMUNITY)
Admission: RE | Admit: 2017-12-25 | Discharge: 2017-12-29 | DRG: 787 | Disposition: A | Discharge status: Home / Self Care | Payer: BLUE CROSS/BLUE SHIELD | Attending: Obstetrics & Gynecology | Admitting: Obstetrics & Gynecology

## 2017-12-25 VITALS — BP 137/77 | HR 100 | Temp 97.8°F | Resp 18 | Ht 65.0 in | Wt 257.9 lb

## 2017-12-25 DIAGNOSIS — D6959 Other secondary thrombocytopenia: Secondary | ICD-10-CM | POA: Diagnosis present

## 2017-12-25 DIAGNOSIS — O99214 Obesity complicating childbirth: Secondary | ICD-10-CM | POA: Diagnosis present

## 2017-12-25 DIAGNOSIS — Z3A38 38 weeks gestation of pregnancy: Secondary | ICD-10-CM | POA: Diagnosis not present

## 2017-12-25 DIAGNOSIS — I160 Hypertensive urgency: Secondary | ICD-10-CM | POA: Diagnosis present

## 2017-12-25 DIAGNOSIS — O1002 Pre-existing essential hypertension complicating childbirth: Principal | ICD-10-CM | POA: Diagnosis present

## 2017-12-25 DIAGNOSIS — O3429 Maternal care due to uterine scar from other previous surgery: Secondary | ICD-10-CM | POA: Diagnosis present

## 2017-12-25 DIAGNOSIS — O9912 Other diseases of the blood and blood-forming organs and certain disorders involving the immune mechanism complicating childbirth: Secondary | ICD-10-CM | POA: Diagnosis present

## 2017-12-25 DIAGNOSIS — I10 Essential (primary) hypertension: Secondary | ICD-10-CM | POA: Diagnosis present

## 2017-12-25 DIAGNOSIS — Z98891 History of uterine scar from previous surgery: Secondary | ICD-10-CM

## 2017-12-25 LAB — TYPE AND SCREEN
ABO/RH(D): A POS
ANTIBODY SCREEN: NEGATIVE

## 2017-12-25 LAB — CBC
HEMATOCRIT: 39.9 % (ref 36.0–46.0)
HEMOGLOBIN: 14.4 g/dL (ref 12.0–15.0)
MCH: 30.6 pg (ref 26.0–34.0)
MCHC: 36.1 g/dL — ABNORMAL HIGH (ref 30.0–36.0)
MCV: 84.9 fL (ref 80.0–100.0)
PLATELETS: 108 10*3/uL — AB (ref 150–400)
RBC: 4.7 MIL/uL (ref 3.87–5.11)
RDW: 15.6 % — ABNORMAL HIGH (ref 11.5–15.5)
WBC: 7.2 10*3/uL (ref 4.0–10.5)
nRBC: 0 % (ref 0.0–0.2)

## 2017-12-25 LAB — COMPREHENSIVE METABOLIC PANEL
ALK PHOS: 145 U/L — AB (ref 38–126)
ALT: 23 U/L (ref 0–44)
ANION GAP: 10 (ref 5–15)
AST: 29 U/L (ref 15–41)
Albumin: 3 g/dL — ABNORMAL LOW (ref 3.5–5.0)
BUN: 7 mg/dL (ref 6–20)
CALCIUM: 9.5 mg/dL (ref 8.9–10.3)
CHLORIDE: 106 mmol/L (ref 98–111)
CO2: 20 mmol/L — ABNORMAL LOW (ref 22–32)
CREATININE: 0.52 mg/dL (ref 0.44–1.00)
Glucose, Bld: 97 mg/dL (ref 70–99)
Potassium: 4 mmol/L (ref 3.5–5.1)
SODIUM: 136 mmol/L (ref 135–145)
Total Bilirubin: 0.7 mg/dL (ref 0.3–1.2)
Total Protein: 6 g/dL — ABNORMAL LOW (ref 6.5–8.1)

## 2017-12-25 LAB — RPR: RPR: NONREACTIVE

## 2017-12-25 MED ORDER — LACTATED RINGERS IV SOLN: 500.0000 mL | INTRAVENOUS | Status: DC | PRN

## 2017-12-25 MED ORDER — ZOLPIDEM TARTRATE 5 MG PO TABS: 5.0000 mg | ORAL_TABLET | Freq: Every evening | ORAL | Status: DC | PRN

## 2017-12-25 MED ORDER — ONDANSETRON HCL 4 MG/2ML IJ SOLN: 4.0000 mg | Freq: Four times a day (QID) | INTRAMUSCULAR | Status: DC | PRN

## 2017-12-25 MED ORDER — FLEET ENEMA 7-19 GM/118ML RE ENEM: 1.0000 | ENEMA | RECTAL | Status: DC | PRN

## 2017-12-25 MED ORDER — ACETAMINOPHEN 325 MG PO TABS: 650.0000 mg | ORAL_TABLET | ORAL | Status: DC | PRN

## 2017-12-25 MED ORDER — FENTANYL CITRATE (PF) 100 MCG/2ML IJ SOLN
50.0000 ug | INTRAMUSCULAR | Status: DC | PRN
  Administered 2017-12-26 (×2): 100 ug via INTRAVENOUS
  Filled 2017-12-25 (×2): qty 2

## 2017-12-25 MED ORDER — OXYCODONE-ACETAMINOPHEN 5-325 MG PO TABS: 1.0000 | ORAL_TABLET | ORAL | Status: DC | PRN

## 2017-12-25 MED ORDER — MISOPROSTOL 25 MCG QUARTER TABLET
25.0000 ug | ORAL_TABLET | ORAL | Status: DC | PRN
  Administered 2017-12-25 – 2017-12-26 (×6): 25 ug via VAGINAL
  Filled 2017-12-25 (×6): qty 1

## 2017-12-25 MED ORDER — LACTATED RINGERS IV SOLN
INTRAVENOUS | Status: DC
  Administered 2017-12-25 – 2017-12-26 (×6): via INTRAVENOUS

## 2017-12-25 MED ORDER — OXYTOCIN 40 UNITS IN LACTATED RINGERS INFUSION - SIMPLE MED: 2.5000 [IU]/h | INTRAVENOUS | Status: DC

## 2017-12-25 MED ORDER — TERBUTALINE SULFATE 1 MG/ML IJ SOLN: 0.2500 mg | Freq: Once | INTRAMUSCULAR | Status: DC | PRN

## 2017-12-25 MED ORDER — LIDOCAINE HCL (PF) 1 % IJ SOLN: 30.0000 mL | INTRAMUSCULAR | Status: DC | PRN

## 2017-12-25 MED ORDER — OXYCODONE-ACETAMINOPHEN 5-325 MG PO TABS: 2.0000 | ORAL_TABLET | ORAL | Status: DC | PRN

## 2017-12-25 MED ORDER — SOD CITRATE-CITRIC ACID 500-334 MG/5ML PO SOLN
30.0000 mL | ORAL | Status: DC | PRN
  Administered 2017-12-26: 30 mL via ORAL
  Filled 2017-12-25: qty 15

## 2017-12-25 MED ORDER — OXYTOCIN BOLUS FROM INFUSION: 500.0000 mL | Freq: Once | INTRAVENOUS | Status: DC

## 2017-12-25 NOTE — Progress Notes (Signed)
Dr. Lynnette Caffey called the unit. Plan of care discussed. Order received to give final cytotec at 12am and to begin pitocin 2x2 at 4am.  Dr. Lynnette Caffey notified of current blood pressures last being 153/99 at 2200.

## 2017-12-25 NOTE — Anesthesia Pain Management Evaluation Note (Signed)
  [facility] Pain Management Visit Note  Patient: Maria Osborn, 32 y.o., female  "Hello I am a member of the anesthesia team at [facility]. We have an anesthesia team available at all times to provide care throughout the hospital, including epidural management and anesthesia for C-section. I don't know your plan for the delivery whether it a natural birth, water birth, IV sedation, nitrous supplementation, doula or epidural, but we want to meet your pain goals."   1.Was your pain managed to your expectations on prior hospitalizations?   No prior hospitalizations  2.What is your expectation for pain management during this hospitalization?     Epidural  3.How can we help you reach that goal? epidural  Record the patient's initial score and the patient's pain goal.   Pain: 1  Pain Goal: 5 [facility] wants you to be able to say your pain was always managed very well.  Aamira Bischoff 12/25/2017

## 2017-12-25 NOTE — Progress Notes (Signed)
Patient requesting to eat.  Will allow a light meal prior to next VMP dose.   Linda Hedges, DO

## 2017-12-25 NOTE — H&P (Signed)
Maria Osborn is a 32 y.o. female presenting for IOL secondary to chronic hypertension on no medication.  She declines HA, CP/SOB, RUQ pain, visual disturbance, RUQ pain.  Additionally, the last trimester has been complicated by thrombocytopenia which has been stable and is not felt to be related to hypertensive disorder; PLT on admit 108.  The patient has h/o myomectomy but it is safe for her to labor.  GBS negative.  Last dose of VMP was given at 0545 and the patient reports feeling only mild CTX.  OB History    Gravida  1   Para      Term      Preterm      AB      Living        SAB      TAB      Ectopic      Multiple      Live Births             Past Medical History:  Diagnosis Date  . Anemia   . Fibroid   . Fibroids   . Headache    Past Surgical History:  Procedure Laterality Date  . DILATION AND CURETTAGE OF UTERUS    . HYSTEROSCOPY W/D&C N/A 06/10/2016   Procedure: DILATATION & CURETTAGE/HYSTEROSCOPY;  Surgeon: Linda Hedges, DO;  Location: [facility];  Service: Gynecology;  Laterality: N/A;  . MYOMECTOMY N/A 06/10/2016   Procedure: MYOMECTOMY, abdominal;  Surgeon: Linda Hedges, DO;  Location: [facility];  Service: Gynecology;  Laterality: N/A;   Family History: family history includes Depression in her mother; Lupus in her maternal aunt; Migraines in her father; Stroke in her maternal uncle and mother. Social History:  reports that she has never smoked. She has never used smokeless tobacco. She reports that she drinks alcohol. She reports that she does not use drugs.     Maternal Diabetes: No Genetic Screening: Normal Maternal Ultrasounds/Referrals: Normal Fetal Ultrasounds or other Referrals:  None Maternal Substance Abuse:  No Significant Maternal Medications:  None Significant Maternal Lab Results:  Lab values include: Group B Strep negative Other Comments:  None  ROS Maternal Medical History:  Contractions: Onset was 6-12 hours ago.   Frequency:  irregular.   Perceived severity is mild.    Fetal activity: Perceived fetal activity is normal.   Last perceived fetal movement was within the past hour.    Prenatal complications: PIH and thrombocytopenia.   Prenatal Complications - Diabetes: none.    Dilation: Closed Effacement (%): Thick Station: -3 Exam by:: Ch[place]wu[place]a RNC Blood pressure (!) 157/95, pulse 93, temperature 98.1 F (36.7 C), temperature source Oral, resp. rate 18, height 5\' 5"  (1.651 m), weight 117 kg, last menstrual period 03/29/2017. Maternal Exam:  Uterine Assessment: Contraction strength is mild.  Contraction frequency is irregular.   Abdomen: Patient reports no abdominal tenderness. Fundal height is S>D.   Estimated fetal weight is 8#4.       Physical Exam  Constitutional: She is oriented to person, place, and time. She appears well-developed and well-nourished.  GI: Soft. There is no rebound and no guarding.  Neurological: She is alert and oriented to person, place, and time.  Skin: Skin is warm and dry.  Psychiatric: She has a normal mood and affect. Her behavior is normal.    Prenatal labs: ABO, Rh: --/--/A POS (10/25 0132) Antibody: NEG (10/25 0132) Rubella: Immune (04/01 0000) RPR: Nonreactive (04/01 0000)  HBsAg: Negative (04/01 0000)  HIV: Non-reactive (04/01 0000)  GBS:  Assessment/Plan: 32yo G1 at [redacted]w[redacted]d with CHTN for IOL -Continue VMP until favorability -Epidural prn; I recommend early epidural since thrombocytopenia -CHTN-IV antihypertensives/magnesium prn -Anticipate NSVD   Maria Osborn 12/25/2017, 8:03 AM

## 2017-12-25 NOTE — Progress Notes (Signed)
CVX cl/th/hi VMP#3 placed  Linda Hedges, DO

## 2017-12-26 ENCOUNTER — Encounter (HOSPITAL_COMMUNITY)
Admission: RE | Disposition: A | Discharge status: Home / Self Care | Payer: Self-pay | Source: Home / Self Care | Attending: Obstetrics & Gynecology

## 2017-12-26 ENCOUNTER — Inpatient Hospital Stay (HOSPITAL_COMMUNITY): Payer: BLUE CROSS/BLUE SHIELD | Admitting: Anesthesiology

## 2017-12-26 ENCOUNTER — Encounter (HOSPITAL_COMMUNITY): Payer: Self-pay

## 2017-12-26 LAB — CBC
HCT: 43.7 % (ref 36.0–46.0)
Hemoglobin: 15.5 g/dL — ABNORMAL HIGH (ref 12.0–15.0)
MCH: 30.3 pg (ref 26.0–34.0)
MCHC: 35.5 g/dL (ref 30.0–36.0)
MCV: 85.5 fL (ref 80.0–100.0)
NRBC: 0 % (ref 0.0–0.2)
PLATELETS: 105 10*3/uL — AB (ref 150–400)
RBC: 5.11 MIL/uL (ref 3.87–5.11)
RDW: 15.4 % (ref 11.5–15.5)
WBC: 7.9 10*3/uL (ref 4.0–10.5)

## 2017-12-26 SURGERY — Surgical Case
Anesthesia: Spinal

## 2017-12-26 MED ORDER — DEXAMETHASONE SODIUM PHOSPHATE 10 MG/ML IJ SOLN
INTRAMUSCULAR | Status: AC
  Filled 2017-12-26: qty 1

## 2017-12-26 MED ORDER — DEXAMETHASONE SODIUM PHOSPHATE 10 MG/ML IJ SOLN
INTRAMUSCULAR | Status: DC | PRN
  Administered 2017-12-26: 10 mg via INTRAVENOUS

## 2017-12-26 MED ORDER — MORPHINE SULFATE (PF) 0.5 MG/ML IJ SOLN
INTRAMUSCULAR | Status: DC | PRN
  Administered 2017-12-26: .15 mg via INTRATHECAL

## 2017-12-26 MED ORDER — SODIUM CHLORIDE 0.9 % IR SOLN
Status: DC | PRN
  Administered 2017-12-26: 1000 mL

## 2017-12-26 MED ORDER — LACTATED RINGERS IV SOLN
INTRAVENOUS | Status: DC | PRN
  Administered 2017-12-26: 23:00:00 via INTRAVENOUS

## 2017-12-26 MED ORDER — TERBUTALINE SULFATE 1 MG/ML IJ SOLN: 0.2500 mg | Freq: Once | INTRAMUSCULAR | Status: DC | PRN

## 2017-12-26 MED ORDER — ONDANSETRON HCL 4 MG/2ML IJ SOLN
INTRAMUSCULAR | Status: DC | PRN
  Administered 2017-12-26: 4 mg via INTRAVENOUS

## 2017-12-26 MED ORDER — BUPIVACAINE IN DEXTROSE 0.75-8.25 % IT SOLN
INTRATHECAL | Status: DC | PRN
  Administered 2017-12-26: 1.7 mL via INTRATHECAL

## 2017-12-26 MED ORDER — ONDANSETRON HCL 4 MG/2ML IJ SOLN
INTRAMUSCULAR | Status: AC
  Filled 2017-12-26: qty 2

## 2017-12-26 MED ORDER — OXYTOCIN 10 UNIT/ML IJ SOLN
INTRAVENOUS | Status: DC | PRN
  Administered 2017-12-26: 40 [IU] via INTRAVENOUS

## 2017-12-26 MED ORDER — OXYTOCIN 40 UNITS IN LACTATED RINGERS INFUSION - SIMPLE MED
1.0000 m[IU]/min | INTRAVENOUS | Status: DC
  Administered 2017-12-26: 10 m[IU]/min via INTRAVENOUS
  Administered 2017-12-26: 2 m[IU]/min via INTRAVENOUS
  Administered 2017-12-26: 6 m[IU]/min via INTRAVENOUS
  Administered 2017-12-26: 8 m[IU]/min via INTRAVENOUS
  Administered 2017-12-26: 4 m[IU]/min via INTRAVENOUS
  Filled 2017-12-26 (×2): qty 1000

## 2017-12-26 MED ORDER — OXYTOCIN 10 UNIT/ML IJ SOLN
INTRAMUSCULAR | Status: AC
  Filled 2017-12-26: qty 4

## 2017-12-26 MED ORDER — FENTANYL CITRATE (PF) 100 MCG/2ML IJ SOLN
INTRAMUSCULAR | Status: DC | PRN
  Administered 2017-12-26: 15 ug via INTRATHECAL

## 2017-12-26 MED ORDER — CEFAZOLIN SODIUM-DEXTROSE 2-4 GM/100ML-% IV SOLN
2.0000 g | Freq: Once | INTRAVENOUS | Status: AC
  Administered 2017-12-26: 2 g via INTRAVENOUS
  Filled 2017-12-26: qty 100

## 2017-12-26 MED ORDER — PHENYLEPHRINE 8 MG IN D5W 100 ML (0.08MG/ML) PREMIX OPTIME
INJECTION | INTRAVENOUS | Status: DC | PRN
  Administered 2017-12-26: 60 ug/min via INTRAVENOUS

## 2017-12-26 MED ORDER — FENTANYL CITRATE (PF) 100 MCG/2ML IJ SOLN
INTRAMUSCULAR | Status: AC
  Filled 2017-12-26: qty 2

## 2017-12-26 MED ORDER — SODIUM CHLORIDE 0.9 % IR SOLN
Status: DC | PRN
  Administered 2017-12-26: 500 mL

## 2017-12-26 MED ORDER — MORPHINE SULFATE (PF) 0.5 MG/ML IJ SOLN
INTRAMUSCULAR | Status: AC
  Filled 2017-12-26: qty 10

## 2017-12-26 SURGICAL SUPPLY — 37 items
BENZOIN TINCTURE PRP APPL 2/3 (GAUZE/BANDAGES/DRESSINGS) ×3 IMPLANT
CHLORAPREP W/TINT 26ML (MISCELLANEOUS) ×3 IMPLANT
CLAMP CORD UMBIL (MISCELLANEOUS) IMPLANT
CLOSURE WOUND 1/2 X4 (GAUZE/BANDAGES/DRESSINGS) ×1
CLOTH BEACON ORANGE TIMEOUT ST (SAFETY) ×3 IMPLANT
DERMABOND ADVANCED (GAUZE/BANDAGES/DRESSINGS)
DERMABOND ADVANCED .7 DNX12 (GAUZE/BANDAGES/DRESSINGS) IMPLANT
DRSG OPSITE POSTOP 4X10 (GAUZE/BANDAGES/DRESSINGS) ×3 IMPLANT
ELECT REM PT RETURN 9FT ADLT (ELECTROSURGICAL) ×3
ELECTRODE REM PT RTRN 9FT ADLT (ELECTROSURGICAL) ×1 IMPLANT
EXTRACTOR VACUUM KIWI (MISCELLANEOUS) IMPLANT
GLOVE BIO SURGEON STRL SZ 6 (GLOVE) ×3 IMPLANT
GLOVE BIOGEL PI IND STRL 6 (GLOVE) ×2 IMPLANT
GLOVE BIOGEL PI IND STRL 7.0 (GLOVE) ×1 IMPLANT
GLOVE BIOGEL PI INDICATOR 6 (GLOVE) ×4
GLOVE BIOGEL PI INDICATOR 7.0 (GLOVE) ×2
GOWN STRL REUS W/TWL LRG LVL3 (GOWN DISPOSABLE) ×6 IMPLANT
HOVERMATT SINGLE USE (MISCELLANEOUS) ×3 IMPLANT
KIT ABG SYR 3ML LUER SLIP (SYRINGE) ×3 IMPLANT
NEEDLE HYPO 25X5/8 SAFETYGLIDE (NEEDLE) ×3 IMPLANT
NS IRRIG 1000ML POUR BTL (IV SOLUTION) ×3 IMPLANT
PACK C SECTION WH (CUSTOM PROCEDURE TRAY) ×3 IMPLANT
PAD OB MATERNITY 4.3X12.25 (PERSONAL CARE ITEMS) ×3 IMPLANT
PENCIL SMOKE EVAC W/HOLSTER (ELECTROSURGICAL) ×3 IMPLANT
RETRACTOR TRAXI PANNICULUS (MISCELLANEOUS) ×1 IMPLANT
SPONGE LAP 18X18 RF (DISPOSABLE) ×9 IMPLANT
STRIP CLOSURE SKIN 1/2X4 (GAUZE/BANDAGES/DRESSINGS) ×2 IMPLANT
SUT CHROMIC 0 CTX 36 (SUTURE) ×12 IMPLANT
SUT MON AB 2-0 CT1 27 (SUTURE) ×3 IMPLANT
SUT PDS AB 0 CT1 27 (SUTURE) IMPLANT
SUT PDS AB 0 CTX 60 (SUTURE) ×9 IMPLANT
SUT PLAIN 0 NONE (SUTURE) IMPLANT
SUT VIC AB 0 CT1 36 (SUTURE) IMPLANT
SUT VIC AB 4-0 KS 27 (SUTURE) IMPLANT
TOWEL OR 17X24 6PK STRL BLUE (TOWEL DISPOSABLE) ×3 IMPLANT
TRAXI PANNICULUS RETRACTOR (MISCELLANEOUS) ×2
TRAY FOLEY W/BAG SLVR 14FR LF (SET/KITS/TRAYS/PACK) IMPLANT

## 2017-12-26 NOTE — Progress Notes (Signed)
Maria Osborn is a 32 y.o. G1P0 at [redacted]w[redacted]d by ultrasound admitted for induction of labor due to Hypertension.  Subjective: Mild cramping since pitocin started this am.  No HA, CP/SOB, RUQ pain, or visual disturbance.  Objective: BP (!) 141/92   Pulse 80   Temp 98.1 F (36.7 C) (Oral)   Resp 17   Ht 5\' 5"  (1.651 m)   Wt 117 kg   LMP 03/29/2017   BMI 42.92 kg/m  No intake/output data recorded. No intake/output data recorded.  FHT:  FHR: 130 bpm, variability: moderate,  accelerations:  Abscent,  decelerations:  Absent UC:   irregular, every 2-3 minutes SVE:   Dilation: 1 Effacement (%): 20, 30 Station: -2 Exam by:: Stefannie Defeo, MD  Foley bulb placed with 60cc NS  Labs: Lab Results  Component Value Date   WBC 7.9 12/26/2017   HGB 15.5 (H) 12/26/2017   HCT 43.7 12/26/2017   MCV 85.5 12/26/2017   PLT 105 (L) 12/26/2017    Assessment / Plan: Induction of labor secondary to chronic hypertension Labor: s/p misoprostol x 6 with pitocin started around 4 am.  Foley bulb now in.  Leave foley bulb in max 12 hours. Preeclampsia:  n/a Fetal Wellbeing:  Category I Pain Control:  n/a I/D:  n/a Anticipated MOD:  NSVD CHTN: am labs ordered as gestational thrombocytopenia.  Rhondalyn Clingan, [place] 12/26/2017, 8:24 AM

## 2017-12-26 NOTE — Progress Notes (Signed)
Adequate MVUs x 4 hours.  CVX unchanged at 4/50/-3 with no descent in vertex.   Patient counseled for C/S secondary to failed induction of labor.  She is informed of the risk of bleeding, infection, scarring, and damage to surrounding structures.  She understands that in her next pregnancy, she will have the option of TOLAC vs schedule C/S.  All questions were answered and the patient wishes to proceed.  Linda Hedges, DO

## 2017-12-26 NOTE — Anesthesia Preprocedure Evaluation (Signed)
Anesthesia Evaluation  Patient identified by MRN, date of birth, ID band Patient awake    Reviewed: Allergy & Precautions, NPO status , Patient's Chart, lab work & pertinent test results  History of Anesthesia Complications Negative for: history of anesthetic complications  Airway Mallampati: II  TM Distance: >3 FB Neck ROM: Full    Dental no notable dental hx. (+) Teeth Intact   Pulmonary neg pulmonary ROS,    Pulmonary exam normal breath sounds clear to auscultation       Cardiovascular hypertension, Normal cardiovascular exam Rhythm:Regular Rate:Normal     Neuro/Psych  Headaches, negative psych ROS   GI/Hepatic negative GI ROS, Neg liver ROS,   Endo/Other  Morbid obesity  Renal/GU negative Renal ROS  negative genitourinary   Musculoskeletal negative musculoskeletal ROS (+)   Abdominal   Peds negative pediatric ROS (+)  Hematology negative hematology ROS (+)   Anesthesia Other Findings   Reproductive/Obstetrics negative OB ROS                             Anesthesia Physical Anesthesia Plan  ASA: III  Anesthesia Plan: Spinal   Post-op Pain Management:    Induction:   PONV Risk Score and Plan: 2 and Treatment may vary due to age or medical condition, Ondansetron and Dexamethasone  Airway Management Planned: Natural Airway  Additional Equipment:   Intra-op Plan:   Post-operative Plan:   Informed Consent: I have reviewed the patients History and Physical, chart, labs and discussed the procedure including the risks, benefits and alternatives for the proposed anesthesia with the patient or authorized representative who has indicated his/her understanding and acceptance.     Plan Discussed with: CRNA  Anesthesia Plan Comments:         Anesthesia Quick Evaluation

## 2017-12-26 NOTE — Op Note (Signed)
Kissa [name]-Askew PROCEDURE DATE: 12/25/2017 - 12/26/2017  PREOPERATIVE DIAGNOSIS: Intrauterine pregnancy at  [redacted]w[redacted]d weeks gestation, arrest of dilation  POSTOPERATIVE DIAGNOSIS: The same with OP presentation  PROCEDURE:  Primary Low Transverse Cesarean Section  SURGEON:  Dr. Linda Hedges  INDICATIONS: Shanty Ginty is a 32 y.o. G1P0 at [redacted]w[redacted]d scheduled for cesarean section secondary to failed induction of labor and arrest of dilation at 4 cm.  The risks of cesarean section discussed with the patient included but were not limited to: bleeding which may require transfusion or reoperation; infection which may require antibiotics; injury to bowel, bladder, ureters or other surrounding organs; injury to the fetus; need for additional procedures including hysterectomy in the event of a life-threatening hemorrhage; placental abnormalities wth subsequent pregnancies, incisional problems, thromboembolic phenomenon and other postoperative/anesthesia complications. The patient concurred with the proposed plan, giving informed written consent for the procedure.    FINDINGS:  Viable female infant in cephalic presentation (OP), APGARs 8,9: weight pending  Clear amniotic fluid.  Intact placenta, three vessel cord.  Grossly normal uterus, ovaries and fallopian tubes. Bladder adhesions to the left uterine corpus. .   ANESTHESIA:  Spinal ESTIMATED BLOOD LOSS: 800 cc SPECIMENS: Placenta sent to L&D COMPLICATIONS: None immediate  PROCEDURE IN DETAIL:  The patient received intravenous antibiotics and had sequential compression devices applied to her lower extremities while in the preoperative area.  She was then taken to the operating room where spinal anesthesia was administered and was found to be adequate. She was then placed in a dorsal supine position with a leftward tilt, and prepped and draped in a sterile manner.  A foley catheter was placed into her bladder and attached to constant gravity.   After an adequate timeout was performed, a Pfannenstiel skin incision was made with scalpel and carried through to the underlying layer of fascia. The fascia was incised in the midline and this incision was extended bilaterally using the Mayo scissors. Kocher clamps were applied to the superior aspect of the fascial incision and the underlying rectus muscles were dissected off bluntly. A similar process was carried out on the inferior aspect of the facial incision. The rectus muscles were separated in the midline bluntly and the peritoneum was entered bluntly. Bladder adhesions were taken down sharply.   A transverse hysterotomy was made with a scalpel and extended bilaterally bluntly. The bladder blade was then removed. The infant was successfully delivered using Kiwi vacuum assistance (one pop off), and cord was clamped and cut and infant was handed over to awaiting neonatology team. Uterine massage was then administered and the placenta delivered intact with three-vessel cord. The uterus was cleared of clot and debris.  The hysterotomy was closed with 0 chromic.  A second imbricating suture of 0-chromic was used to reinforce the incision and aid in hemostasis.  The peritoneum and rectus muscles were noted to be hemostatic and were reapproximated using 2-0 monocryl in a running fashion  The fascia was closed with 0-PDS in a running fashion with good restoration of anatomy.  The subcutaneus tissue was copiously irrigated.  The skin was closed with 4-0 vicryl in a subcuticular fashion.  Pt tolerated the procedure will.  All counts were correct x2.  Pt went to the recovery room in stable condition.

## 2017-12-26 NOTE — Progress Notes (Signed)
Feeling regular, strong CTX.  Received fentanyl x 1 with good relief.  Inability to accurately monitor UC. CVX 4/50/-3, IUPC placed. Continue pitocin and monitor MVUs.  Linda Hedges, DO

## 2017-12-26 NOTE — Anesthesia Procedure Notes (Signed)
Spinal  Patient location during procedure: OR Start time: 12/26/2017 10:30 PM End time: 12/26/2017 10:31 PM Staffing Anesthesiologist: Brennan Bailey, MD Performed: anesthesiologist  Preanesthetic Checklist Completed: patient identified, site marked, pre-op evaluation, timeout performed, IV checked, risks and benefits discussed and monitors and equipment checked Spinal Block Patient position: sitting Prep: ChloraPrep Patient monitoring: heart rate, cardiac monitor and continuous pulse ox Approach: midline Location: L3-4 Injection technique: single-shot Needle Needle type: Pencan  Needle gauge: 24 G Needle length: 10 cm Assessment Sensory level: T6 Additional Notes Risks, benefits, and alternative discussed. Patient gave consent to procedure. Prepped and draped in sitting position. Clear CSF obtained after one needle redirection. Positive terminal aspiration. No pain or paraesthesias with injection. Patient tolerated procedure well. Vital signs stable. Tawny Asal, MD

## 2017-12-27 ENCOUNTER — Encounter (HOSPITAL_COMMUNITY): Payer: Self-pay

## 2017-12-27 DIAGNOSIS — Z98891 History of uterine scar from previous surgery: Secondary | ICD-10-CM

## 2017-12-27 LAB — CBC
HCT: 40.7 % (ref 36.0–46.0)
HEMOGLOBIN: 14.4 g/dL (ref 12.0–15.0)
MCH: 30.5 pg (ref 26.0–34.0)
MCHC: 35.4 g/dL (ref 30.0–36.0)
MCV: 86.2 fL (ref 80.0–100.0)
Platelets: 105 10*3/uL — ABNORMAL LOW (ref 150–400)
RBC: 4.72 MIL/uL (ref 3.87–5.11)
RDW: 15.6 % — ABNORMAL HIGH (ref 11.5–15.5)
WBC: 13.8 10*3/uL — ABNORMAL HIGH (ref 4.0–10.5)
nRBC: 0.1 % (ref 0.0–0.2)

## 2017-12-27 MED ORDER — ACETAMINOPHEN 325 MG PO TABS
650.0000 mg | ORAL_TABLET | ORAL | Status: DC | PRN
  Administered 2017-12-28 – 2017-12-29 (×2): 650 mg via ORAL
  Filled 2017-12-27 (×3): qty 2

## 2017-12-27 MED ORDER — ONDANSETRON HCL 4 MG/2ML IJ SOLN: 4.0000 mg | Freq: Three times a day (TID) | INTRAMUSCULAR | Status: DC | PRN

## 2017-12-27 MED ORDER — COCONUT OIL OIL: 1.0000 "application " | TOPICAL_OIL | Status: DC | PRN

## 2017-12-27 MED ORDER — NALBUPHINE HCL 10 MG/ML IJ SOLN: 5.0000 mg | INTRAMUSCULAR | Status: DC | PRN

## 2017-12-27 MED ORDER — NALBUPHINE HCL 10 MG/ML IJ SOLN: 5.0000 mg | Freq: Once | INTRAMUSCULAR | Status: DC | PRN

## 2017-12-27 MED ORDER — NALOXONE HCL 0.4 MG/ML IJ SOLN: 0.4000 mg | INTRAMUSCULAR | Status: DC | PRN

## 2017-12-27 MED ORDER — KETOROLAC TROMETHAMINE 30 MG/ML IJ SOLN
INTRAMUSCULAR | Status: AC
  Administered 2017-12-27: 30 mg via INTRAVENOUS
  Filled 2017-12-27: qty 1

## 2017-12-27 MED ORDER — PRENATAL MULTIVITAMIN CH
1.0000 | ORAL_TABLET | Freq: Every day | ORAL | Status: DC
  Administered 2017-12-27 – 2017-12-29 (×3): 1 via ORAL
  Filled 2017-12-27 (×3): qty 1

## 2017-12-27 MED ORDER — KETOROLAC TROMETHAMINE 30 MG/ML IJ SOLN
30.0000 mg | Freq: Four times a day (QID) | INTRAMUSCULAR | Status: AC | PRN
  Administered 2017-12-27: 30 mg via INTRAVENOUS
  Filled 2017-12-27: qty 1

## 2017-12-27 MED ORDER — KETOROLAC TROMETHAMINE 30 MG/ML IJ SOLN
30.0000 mg | Freq: Once | INTRAMUSCULAR | Status: AC | PRN
  Administered 2017-12-27: 30 mg via INTRAVENOUS

## 2017-12-27 MED ORDER — DIPHENHYDRAMINE HCL 25 MG PO CAPS: 25.0000 mg | ORAL_CAPSULE | ORAL | Status: DC | PRN

## 2017-12-27 MED ORDER — DIBUCAINE 1 % RE OINT: 1.0000 "application " | TOPICAL_OINTMENT | RECTAL | Status: DC | PRN

## 2017-12-27 MED ORDER — LACTATED RINGERS IV SOLN
INTRAVENOUS | Status: DC
  Administered 2017-12-27: 13:00:00 via INTRAVENOUS
  Administered 2017-12-27: 999 mL via INTRAVENOUS

## 2017-12-27 MED ORDER — OXYTOCIN 40 UNITS IN LACTATED RINGERS INFUSION - SIMPLE MED: 2.5000 [IU]/h | INTRAVENOUS | Status: AC

## 2017-12-27 MED ORDER — SENNOSIDES-DOCUSATE SODIUM 8.6-50 MG PO TABS
2.0000 | ORAL_TABLET | ORAL | Status: DC
  Administered 2017-12-27 – 2017-12-29 (×3): 2 via ORAL
  Filled 2017-12-27 (×3): qty 2

## 2017-12-27 MED ORDER — FENTANYL CITRATE (PF) 100 MCG/2ML IJ SOLN: 25.0000 ug | INTRAMUSCULAR | Status: DC | PRN

## 2017-12-27 MED ORDER — NALOXONE HCL 4 MG/10ML IJ SOLN
1.0000 ug/kg/h | INTRAVENOUS | Status: DC | PRN
  Filled 2017-12-27: qty 5

## 2017-12-27 MED ORDER — MENTHOL 3 MG MT LOZG: 1.0000 | LOZENGE | OROMUCOSAL | Status: DC | PRN

## 2017-12-27 MED ORDER — ACETAMINOPHEN 500 MG PO TABS
1000.0000 mg | ORAL_TABLET | Freq: Four times a day (QID) | ORAL | Status: AC
  Administered 2017-12-27 – 2017-12-28 (×4): 1000 mg via ORAL
  Filled 2017-12-27 (×4): qty 2

## 2017-12-27 MED ORDER — SODIUM CHLORIDE 0.9% FLUSH: 3.0000 mL | INTRAVENOUS | Status: DC | PRN

## 2017-12-27 MED ORDER — WITCH HAZEL-GLYCERIN EX PADS: 1.0000 "application " | MEDICATED_PAD | CUTANEOUS | Status: DC | PRN

## 2017-12-27 MED ORDER — IBUPROFEN 600 MG PO TABS
600.0000 mg | ORAL_TABLET | Freq: Four times a day (QID) | ORAL | Status: DC
  Administered 2017-12-27 – 2017-12-29 (×8): 600 mg via ORAL
  Filled 2017-12-27 (×8): qty 1

## 2017-12-27 MED ORDER — OXYCODONE-ACETAMINOPHEN 5-325 MG PO TABS
1.0000 | ORAL_TABLET | ORAL | Status: DC | PRN
  Administered 2017-12-28 – 2017-12-29 (×3): 1 via ORAL
  Filled 2017-12-27 (×4): qty 1

## 2017-12-27 MED ORDER — SIMETHICONE 80 MG PO CHEW
80.0000 mg | CHEWABLE_TABLET | Freq: Three times a day (TID) | ORAL | Status: DC
  Administered 2017-12-27 – 2017-12-29 (×7): 80 mg via ORAL
  Filled 2017-12-27 (×7): qty 1

## 2017-12-27 MED ORDER — TETANUS-DIPHTH-ACELL PERTUSSIS 5-2.5-18.5 LF-MCG/0.5 IM SUSP: 0.5000 mL | Freq: Once | INTRAMUSCULAR | Status: DC

## 2017-12-27 MED ORDER — ZOLPIDEM TARTRATE 5 MG PO TABS: 5.0000 mg | ORAL_TABLET | Freq: Every evening | ORAL | Status: DC | PRN

## 2017-12-27 MED ORDER — PROMETHAZINE HCL 25 MG/ML IJ SOLN: 6.2500 mg | INTRAMUSCULAR | Status: DC | PRN

## 2017-12-27 MED ORDER — KETOROLAC TROMETHAMINE 30 MG/ML IJ SOLN: 30.0000 mg | Freq: Four times a day (QID) | INTRAMUSCULAR | Status: AC | PRN

## 2017-12-27 MED ORDER — DIPHENHYDRAMINE HCL 25 MG PO CAPS: 25.0000 mg | ORAL_CAPSULE | Freq: Four times a day (QID) | ORAL | Status: DC | PRN

## 2017-12-27 MED ORDER — SIMETHICONE 80 MG PO CHEW: 80.0000 mg | CHEWABLE_TABLET | ORAL | Status: DC | PRN

## 2017-12-27 MED ORDER — DIPHENHYDRAMINE HCL 50 MG/ML IJ SOLN: 12.5000 mg | INTRAMUSCULAR | Status: DC | PRN

## 2017-12-27 MED ORDER — OXYCODONE-ACETAMINOPHEN 5-325 MG PO TABS
2.0000 | ORAL_TABLET | ORAL | Status: DC | PRN
  Administered 2017-12-28 – 2017-12-29 (×2): 2 via ORAL
  Filled 2017-12-27 (×2): qty 2

## 2017-12-27 MED ORDER — SIMETHICONE 80 MG PO CHEW
80.0000 mg | CHEWABLE_TABLET | ORAL | Status: DC
  Administered 2017-12-27 – 2017-12-29 (×3): 80 mg via ORAL
  Filled 2017-12-27 (×3): qty 1

## 2017-12-27 NOTE — Anesthesia Postprocedure Evaluation (Signed)
Anesthesia Post Note  Patient: Maria Osborn  Procedure(s) Performed: CESAREAN SECTION (N/A )     Patient location during evaluation: Mother Baby Anesthesia Type: Spinal Level of consciousness: awake and alert Pain management: pain level controlled Vital Signs Assessment: post-procedure vital signs reviewed and stable Respiratory status: spontaneous breathing Cardiovascular status: stable Postop Assessment: no headache, patient able to bend at knees, no apparent nausea or vomiting, no backache, adequate PO intake, spinal receding and able to ambulate Anesthetic complications: no    Last Vitals:  Vitals:   12/27/17 1100 12/27/17 1300  BP:  109/66  Pulse:  68  Resp:  18  Temp:  36.5 C  SpO2: 98% 99%    Last Pain:  Vitals:   12/27/17 1300  TempSrc: Oral  PainSc: 0-No pain   Pain Goal: Patients Stated Pain Goal: 6 (12/26/17 1738)               Ailene Ards

## 2017-12-27 NOTE — Transfer of Care (Signed)
Immediate Anesthesia Transfer of Care Note  Patient: Maria Osborn  Procedure(s) Performed: CESAREAN SECTION (N/A )  Patient Location: PACU  Anesthesia Type:Spinal  Level of Consciousness: awake, alert  and oriented  Airway & Oxygen Therapy: Patient Spontanous Breathing  Post-op Assessment: Report given to RN and Post -op Vital signs reviewed and stable  Post vital signs: Reviewed and stable  Last Vitals:  Vitals Value Taken Time  BP    Temp    Pulse 82 12/27/2017 12:18 AM  Resp 13 12/27/2017 12:18 AM  SpO2 99 % 12/27/2017 12:18 AM    Last Pain:  Vitals:   12/26/17 1947  TempSrc: Oral  PainSc: 0-No pain      Patients Stated Pain Goal: 6 (65/46/50 3546)  Complications: No apparent anesthesia complications

## 2017-12-27 NOTE — Anesthesia Postprocedure Evaluation (Signed)
Anesthesia Post Note  Patient: Maria Osborn  Procedure(s) Performed: CESAREAN SECTION (N/A )     Patient location during evaluation: PACU Anesthesia Type: Spinal Level of consciousness: awake and alert Pain management: pain level controlled Vital Signs Assessment: post-procedure vital signs reviewed and stable Respiratory status: spontaneous breathing, nonlabored ventilation and respiratory function stable Cardiovascular status: blood pressure returned to baseline and stable Postop Assessment: no apparent nausea or vomiting, no headache, no backache and spinal receding Anesthetic complications: no    Last Vitals:  Vitals:   12/26/17 2021 12/27/17 0016  BP: 132/79 116/67  Pulse: 92 82  Resp:  13  Temp:    SpO2:  99%    Last Pain:  Vitals:   12/26/17 1947  TempSrc: Oral  PainSc: 0-No pain   Pain Goal: Patients Stated Pain Goal: 6 (12/26/17 1738)               Brennan Bailey

## 2017-12-27 NOTE — Lactation Note (Signed)
This note was copied from a baby's chart. Lactation Consultation Note  Patient Name: Maria Osborn [id number] Date: 12/27/2017 Reason for consult: Initial assessment;Primapara;1st time breastfeeding;Early term 37-38.6wks  P1 mother whose infant is now 73 hours old.  This is an ETI at 38+3 weeks.  Baby sleeping in father's arms and not showing feeding cues when I arrived.  Mother's breasts are soft and non tender and nipples are everted.  Discussed basic breast feeding with mother and initiatted the DEBP for increasing milk supply.  Pump parts, assembly, disassembly and cleaning of pump parts reviewed.  #24 flange size changed to a #27 flange size for a more appropriate fit.  Mother is familiar with feeding cues and hand expression.  Colostrum container provided for any EBM she may obtain with hand expression.  Demonstrated finger feeding.  Encouraged to feed baby 8-12 times/24 hours or sooner if baby shows cues.  Mother will awaken at 3 hours if he does not self awaken.  She will call for latch assistance as needed.    Mom made aware of O/P services, breastfeeding support groups, community resources, and our phone # for post-discharge questions. Father and family present.  RN updated on pump set up.   Maternal Data Formula Feeding for Exclusion: No Has patient been taught Hand Expression?: Yes Does the patient have breastfeeding experience prior to this delivery?: No  Feeding Feeding Type: Breast Fed  LATCH Score Latch: Grasps breast easily, tongue down, lips flanged, rhythmical sucking.  Audible Swallowing: A few with stimulation  Type of Nipple: Everted at rest and after stimulation  Comfort (Breast/Nipple): Soft / non-tender  Hold (Positioning): Assistance needed to correctly position infant at breast and maintain latch.  LATCH Score: 8  Interventions Interventions: Breast feeding basics reviewed;Assisted with latch;Breast massage;Breast compression;Adjust  position;Support pillows;Position options  Lactation Tools Discussed/Used WIC Program: No Pump Review: Setup, frequency, and cleaning;Milk Storage Initiated by:: Micayla Brathwaite Date initiated:: 12/27/17   Consult Status Consult Status: Follow-up Date: 12/28/17 Follow-up type: In-patient    Little Ishikawa 12/27/2017, 12:37 PM

## 2017-12-27 NOTE — Progress Notes (Signed)
Subjective: Postpartum Day 1: Cesarean Delivery Patient reports tolerating PO.  Desires circ but no void yet and no peds check.  Denies HA, CP/SOB, RUQ pain, visual disturbance.  Objective: Vital signs in last 24 hours: Temp:  [97.7 F (36.5 C)-99.4 F (37.4 C)] 98.4 F (36.9 C) (10/27 0900) Pulse Rate:  [60-101] 62 (10/27 0545) Resp:  [11-25] 18 (10/27 0900) BP: (115-151)/(51-89) 120/73 (10/27 0545) SpO2:  [93 %-99 %] 98 % (10/27 0900)  Physical Exam:  General: alert, cooperative and appears stated age 32: appropriate Uterine Fundus: firm Incision: healing well, no significant drainage, no dehiscence DVT Evaluation: No evidence of DVT seen on physical exam. Negative Homan's sign. No cords or calf tenderness.  Recent Labs    12/26/17 0805 12/27/17 0607  HGB 15.5* 14.4  HCT 43.7 40.7    Assessment/Plan: Status post Cesarean section. Doing well postoperatively.  Continue current care. Plan circ tomorrow. 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  Ruthella Kirchman, [place] 12/27/2017, 10:19 AM

## 2017-12-28 NOTE — Lactation Note (Signed)
This note was copied from a baby's chart. Lactation Consultation Note  Patient Name: Maria Osborn [id number] Date: 12/28/2017 Reason for consult: Follow-up assessment;Mother's request;Difficult latch;Early term 37-38.6wks;Primapara;1st time breastfeeding  P1 mother whose infant is now 51 hours old.  Mother requested latch assistance.  When I arrived she had just finished feeding baby on the left breast in the football hold.  He fed for 14 minutes and was asleep.  Mother was very pleased.  She had previously been able to latch him on the right side but not the left side until this last feeding.    Mother was requesting a NS.  Her breasts are soft and non tender and nipples are everted; the left nipple is shorter than the right but still everted.  I discussed this with her and do not feel a need to introduce a NS at this time, especially since she was able to latch and feed well.  I provided breast shells and a manual pump with instructions for use to help alleviate her concerns over her nipples and she was satisfied.  #24 flange size changed to a #27 flange size for a more appropriate fit.    Mother will call for latch assistance as needed.  She will continue to also use the DEBP to help increase milk supply per her request.  She has no further questions/concerns at this time.  Father present and supportive.   Maternal Data Formula Feeding for Exclusion: No Has patient been taught Hand Expression?: Yes Does the patient have breastfeeding experience prior to this delivery?: No  Feeding Feeding Type: Breast Milk  LATCH Score                   Interventions    Lactation Tools Discussed/Used Pump Review: Setup, frequency, and cleaning;Milk Storage Initiated by:: Paul Dykes Date initiated:: 12/29/17   Consult Status Consult Status: Follow-up Date: 12/29/17 Follow-up type: In-patient    Little Ishikawa 12/28/2017, 4:38 PM

## 2017-12-28 NOTE — Lactation Note (Signed)
This note was copied from a baby's chart. Lactation Consultation Note  Patient Name: Maria Osborn [id number] Date: 12/28/2017 Reason for consult: Follow-up assessment;Early term 37-38.6wks;Primapara Mom reports that baby is latching to right but not left breast.  Baby is currently sleeping in crib.  Instructed to call for assist with feeding cues.  Maternal Data    Feeding Feeding Type: Breast Milk  LATCH Score                   Interventions    Lactation Tools Discussed/Used     Consult Status Consult Status: Follow-up Date: 12/29/17 Follow-up type: In-patient    [name] 12/28/2017, 3:08 PM

## 2017-12-28 NOTE — Progress Notes (Signed)
Subjective: Postpartum Day 2: Cesarean Delivery Patient reports tolerating PO.    Objective: Vital signs in last 24 hours: Temp:  [97.7 F (36.5 C)-98.4 F (36.9 C)] 98.2 F (36.8 C) (10/28 0624) Pulse Rate:  [68-95] 86 (10/28 0624) Resp:  [18] 18 (10/28 0624) BP: (109-137)/(65-81) 129/65 (10/28 0624) SpO2:  [94 %-99 %] 94 % (10/28 1115)  Physical Exam:  General: alert Lochia: appropriate Uterine Fundus: firm Incision: healing well DVT Evaluation: No evidence of DVT seen on physical exam.  Recent Labs    12/26/17 0805 12/27/17 0607  HGB 15.5* 14.4  HCT 43.7 40.7   CBC    Component Value Date/Time   WBC 13.8 (H) 12/27/2017 0607   RBC 4.72 12/27/2017 0607   HGB 14.4 12/27/2017 0607   HCT 40.7 12/27/2017 0607   PLT 105 (L) 12/27/2017 0607   MCV 86.2 12/27/2017 0607   MCH 30.5 12/27/2017 0607   MCHC 35.4 12/27/2017 0607   RDW 15.6 (H) 12/27/2017 5208    Assessment/Plan: Status post Cesarean section. Doing well postoperatively.  Continue current care. PLTS borderline, recheck in am  Margarette Asal 12/28/2017, 7:51 AM

## 2017-12-29 ENCOUNTER — Encounter (HOSPITAL_COMMUNITY): Payer: Self-pay | Admitting: *Deleted

## 2017-12-29 LAB — CBC
HCT: 31.9 % — ABNORMAL LOW (ref 36.0–46.0)
Hemoglobin: 11.1 g/dL — ABNORMAL LOW (ref 12.0–15.0)
MCH: 30 pg (ref 26.0–34.0)
MCHC: 34.8 g/dL (ref 30.0–36.0)
MCV: 86.2 fL (ref 80.0–100.0)
NRBC: 0 % (ref 0.0–0.2)
PLATELETS: 81 10*3/uL — AB (ref 150–400)
RBC: 3.7 MIL/uL — ABNORMAL LOW (ref 3.87–5.11)
RDW: 15.6 % — AB (ref 11.5–15.5)
WBC: 10.3 10*3/uL (ref 4.0–10.5)

## 2017-12-29 MED ORDER — DOCUSATE SODIUM 100 MG PO CAPS: 100.0000 mg | ORAL_CAPSULE | Freq: Two times a day (BID) | ORAL | 2 refills | Status: DC

## 2017-12-29 MED ORDER — OXYCODONE-ACETAMINOPHEN 5-325 MG PO TABS: 1.0000 | ORAL_TABLET | ORAL | 0 refills | Status: DC | PRN

## 2017-12-29 NOTE — Discharge Summary (Signed)
Obstetric Discharge Summary Reason for Admission: induction of labor and cHTN Prenatal Procedures: none Intrapartum Procedures: cesarean: low cervical, transverse - arrest of dilation Postpartum Procedures: none Complications-Operative and Postpartum: thrombocytopenia - present prior to admission. PLTs nadir at 80k, no evidence on exam, plan fu in office Hemoglobin  Date Value Ref Range Status  12/29/2017 11.1 (L) 12.0 - 15.0 g/dL Final    Comment:    DELTA CHECK NOTED REPEATED TO VERIFY    HCT  Date Value Ref Range Status  12/29/2017 31.9 (L) 36.0 - 46.0 % Final    Physical Exam:  General: alert, cooperative and appears stated age 3: appropriate Uterine Fundus: firm Incision: healing well, no significant drainage, no dehiscence, no significant erythema DVT Evaluation: No evidence of DVT seen on physical exam. Negative Homan's sign. No cords or calf tenderness. No significant calf/ankle edema.  Discharge Diagnoses: Term Pregnancy-delivered, Failed induction and CS  Discharge Information: Date: 12/29/2017 Activity: pelvic rest Diet: routine Medications: Colace and Percocet Condition: stable Instructions: refer to practice specific booklet Discharge to: home   Newborn Data: Live born female  Birth Weight: 7 lb 10.4 oz (3470 g) APGAR: 8, 9 D/W patient female infant circumcision, risks/benefits reviewed. All questions answered.  Newborn Delivery   Birth date/time:  12/26/2017 23:07:00 Delivery type:  C-Section, Low Transverse Trial of labor:  Yes C-section categorization:  Primary     Home with mother.  Maria Osborn 12/29/2017, 8:44 AM

## 2017-12-29 NOTE — Lactation Note (Signed)
This note was copied from a baby's chart. Lactation Consultation Note  Patient Name: Maria Osborn [id number] Date: 12/29/2017   Observed baby at the breast in football hold.  Mom wearing bra, but not supporting breast.  Recommended she support and compress breast during sucking to increase milk transfer.   Mouth has a wide gape, and no discomfort felt per Mom.  Occasional swallows identified.  Reviewed importance of STS and feeding baby often on cue. Message sent to OP lactation for follow-up appointment.    Maria Osborn 12/29/2017, 1:20 PM

## 2017-12-29 NOTE — Lactation Note (Signed)
This note was copied from a baby's chart. Lactation Consultation Note  Patient Name: Boy Natesha Hassey [id number] Date: 12/29/2017 Reason for consult: Follow-up assessment;Infant weight loss;1st time breastfeeding;Primapara;Early term 54-38.6wks  Visited with P1 Mom of ET infant on day of discharge, baby 85 hrs old and at 8.6% weight loss.   Mom states baby is latching better to both breasts now.   Mom using hand pump to help evert nipples prior to latching.  Last latch score 8.  Denies nipple soreness.  Pediatrician ordered 30 ml of EBM+/formula after breastfeeding.  DEBP set up at bedside.  Mom to double pump for 15-20 ml after breastfeeding while baby is supplementing.  Encouraged Mom to do STS, and feed baby on cue  Mom aware of OP lactation support, will call regarding OP appointment prn. Engorgement prevention and treatment reviewed.  Consult Status Consult Status: Complete Date: 12/29/17 Follow-up type: Call as needed    Broadus John 12/29/2017, 8:53 AM

## 2018-01-03 ENCOUNTER — Inpatient Hospital Stay (HOSPITAL_COMMUNITY): Admit: 2018-01-03 | Payer: Self-pay

## 2018-01-06 ENCOUNTER — Telehealth: Payer: Self-pay | Admitting: Hematology and Oncology

## 2018-01-06 ENCOUNTER — Encounter: Payer: Self-pay | Admitting: Hematology and Oncology

## 2018-01-06 NOTE — Telephone Encounter (Signed)
New referral received from Dr. Lynnette Caffey for low platelets. Pt has been cld and scheduled to see Dr. Audelia Hives on 11/25 at 1pm. Pt aware to arrive 30 minutes early. Letter mailed to the pt.

## 2018-01-19 ENCOUNTER — Telehealth: Payer: Self-pay | Admitting: Hematology and Oncology

## 2018-01-19 NOTE — Telephone Encounter (Signed)
Pt has been cld and rescheduled to see Dr. Audelia Hives on 12/3 at 230pm. Pt agreed to the appt date and time.

## 2018-01-25 ENCOUNTER — Encounter: Payer: BLUE CROSS/BLUE SHIELD | Admitting: Hematology and Oncology

## 2018-02-01 ENCOUNTER — Ambulatory Visit: Payer: Self-pay

## 2018-02-01 NOTE — Lactation Note (Signed)
This note was copied from a baby's chart. 02/01/2018  Name: Maria Osborn MRN: [id number] Date of Birth: 12/26/2016 Gestational Age: Gestational Age: [redacted]w[redacted]d Birth Weight: 122.4 oz Weight today:    8 pounds 8.8 ounces (3880 grams) with clean newborn diaper  Infant presents today with mom and dad for feeding assessment. Mom is concerned with milk supply. Infant supplementation with formula has increased over time. Mom would like to exclusively BF her infant.   Infant has gained 710 grams in the last 34 days with an average daily weight gain of 21 grams a day.   Mom reports infant will latch with each feeding for a few minutes and then becomes frustrated. Mom then gives a bottle. They have increased supplementation to 3 ounces per feeding in the last 2 days. Mom reports infant feeds 6 x a day. Enc family to make sure infant gets 7-8 feeds a day. He is sleeping most of the night currently. Although weight gain is WNL, it is on the lower end of normal.   Mom is pumping with a Motif pump and getting a few gtts in the last few weeks. She reports her supply has steadily decreased with pumping. Mom is taking Milk Flow Plus 3 x a day (1800 mg Fenugreek per drink and Blessed Thistle) for the last 2 weeks. She reports her supply was decreased prior to taking and has not increased since taking. Reviewed Moringa and Reglan. 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 Enc mom to think about renting a hospital grade pump from the hospital gift shop. She reports her pump is not as strong as the hospital pumps. Mom has tubing for Medela Symphony pump at home. Enc mom to follow pumping with hand expression. Mo mis not pumping at night consistently, enc mom to pump at least once at night.   Infant has had thrush and has been treated. Mom reports when he was diagnosed with Thrush he began not eating as well and her supply began  decreasing. Mom reports her nipples are no longer sore although she has shooting pains to left breast post pumping and feeding. Enc mom to call OB for Diflucan treatment. Mom is sanitizing pump parts, bottles and pacifiers. Infant with light coating on tongue that does not appear to be Thrush.   Infant latched to the left breast and fed for about 5 minutes before becoming agitated. Mom was ready to take infant off and give a bottle. Enc mom to supplement at the breast, infant latched very easily with the 5 french feeding tube and fed well. He did stay on and nurse when the milk ran out for a few minutes. Infant did not transfer milk from mom. Reviewed using the 5 french feeding tube at home for more breast stimulation. Reviewed with parents about the Medela double SNS being an option also.   Reviewed fluids, caloric intake for mom (mom reports she has not had an appetite and is not eating well), Fenugreek, Moringa, Reglan, trying another pump, STS, SNS, hand expression, relaxation with feeding/pumping.   Infant to follow up with Ped on Dec. 11. mom aware of BF Support Groups. Infant to follow up with Lactation in 2 weeks at [facility] request.   Mom reports all questions/concerns have been answered. Enc mom to call with any questions/concerns as needed.       General Information: Mother's reason for visit: Feeding assessment, milk supply  issues Consult: Initial Lactation consultant: Nonah Mattes RN,IBCLC Breastfeeding experience: BF for short periods and supplementing with a bottle post feeding, supplementation is increasing Maternal medical conditions: Pregnancy induced hypertension(HTN continued post delivery per mom) Maternal medications: Pre-natal vitamin, Other(Nifedipine)  Breastfeeding History: Frequency of breast feeding: every 2-5 hours Duration of feeding: 5-10 minutes, one breast  Supplementation: Supplement method: bottle(Avent Natural Flow) Brand: Enfamil Formula volume: 3  ounces Formula frequency: 6 x a day Total formula volume per day: 18 ounces       Pump type: Other(Motif) Pump frequency: 7-8 x a day Pump volume: gtts  Infant Output Assessment: Voids per 24 hours: 6-8 Urine color: Clear yellow Stools per 24 hours: 6-8 Stool color: Brown  Breast Assessment: Breast: Soft, Compressible Nipple: Erect Pain level: 0 Pain interventions: Bra, Breast pump  Feeding Assessment: Infant oral assessment: Variance Infant oral assessment comment: Infant with thin labial frenulum, upper lip with milk tighness with flanging, upper lip flanges well on the breast. Infant with good tongue extension and lateralization. infant with some decreased mid tongue elevation. Infant with strong suckle on gloved finger with tongue supping and extension. Nipple is rounded post feeding with no pain with feedings. infant with hump to back of tongue when suckling, Will reassess at next assessment.  Positioning: Armed forces logistics/support/administrative officer: 1 - Repeated attempts needed to sustain latch, nipple held in mouth throughout feeding, stimulation needed to elicit sucking reflex. Audible swallowing: 0 - None Type of nipple: 2 - Everted at rest and after stimulation Comfort: 2 - Soft/non-tender Hold: 2 - No assistance needed to correctly position infant at breast LATCH score: 7 Latch assessment: Deep Lips flanged: Yes Suck assessment: Displays both   Pre-feed weight: 3880 grams Post feed weight: 3880 grams Amount transferred: 0 Amount supplemented: 0  Additional Feeding Assessment: Infant oral assessment: Variance Infant oral assessment comment: see above Positioning: Football(right breast) Latch: 2 - Grasps breast easily, tongue down, lips flanged, rhythmical sucking. Audible swallowing: 2 - Spontaneous and intermittent(with 5 french feeding tube attached) Type of nipple: 2 - Everted at rest and after stimulation Comfort: 2 - Soft/non-tender Hold: 1 - Assistance needed to correctly  position infant at breast and maintain latch LATCH score: 9 Latch assessment: Deep Lips flanged: Yes Suck assessment: Displays both Tools: Syringe with 5 Fr feeding tube Pre-feed weight: 3880 grams Post feed weight: 3950 grams Amount transferred: 0 from breast Amount supplemented: 70 ml formula from 5 french feeding tube   Totals: Total amount transferred: 0 Total supplement given: 70 ml Total amount pumped post feed: did not pump   Plan:  1. Feed infant at the breast with feeding cues 2. Offer both breasts with each feeding 3. Massage/intermittenly compress breast with feedings to maximize milk flow 4. Use the 5 french feeding tube at the breast as mom and infant want to help with breast stimulation.  5. Offer infant bottle of breast milk or formula post feeding  6. Infant needs about 71-95 ml (2.5-3 ounces) for 8 feedings a day or 570-760 ml (19-25 ounces) in the last 24 hours. Infant may take more or less depending on how often he feeds. Feed infant until he is satisfied.  7. Continue pumping about 6-8 x a day post BF to promote milk supply 8. Can continue Milk Flow Plus Supplement if OK with OB 9. Moringa 400 mg 3 tablets 3 x a day have been helpful for some mom's  10. Reglan 10 mg 3 x a day has been helpful for  some mom's. Must obtain a prescription from OB if they will prescribe 11. Call OB and ask for Diflucan for intraductal yeast symptoms 12. Consider renting a hospital grade breast pump for 2-4 weeks from the hospital gift shop to see if that will help 13. Keep up the good work 89. Thank you for allowing me to assist you today 15. Please call with any questions/concerns as needed [phone] 16. Follow up with Lactation in 2 weeks  [place], IBCLC                                                        Donn Pierini 02/01/2018, 3:40 PM

## 2018-02-02 ENCOUNTER — Telehealth: Payer: Self-pay | Admitting: Nurse Practitioner

## 2018-02-02 ENCOUNTER — Inpatient Hospital Stay: Payer: 59 | Admitting: Nurse Practitioner

## 2018-02-02 NOTE — Telephone Encounter (Signed)
Pt cld to reschedule appt to 12/12 at 1pm.

## 2018-02-08 ENCOUNTER — Telehealth (HOSPITAL_COMMUNITY): Payer: Self-pay | Admitting: Lactation Services

## 2018-02-08 NOTE — Telephone Encounter (Signed)
Patient is 6 weeks postpartum & called w/questions. She had seen an outpatient lactation consultant on 02/01/18. She pumps 8 times/day & gets nothing. She sees nothing when she does hand expression. However, she says her breasts feel like "they're full" with an area on her L breast near her axillae & an area on her R breast near her nipple that she likens to a plugged duct. Patient denies any redness, swelling, or pain on breasts or in the areas that she's referring to as possible plugged ducts.   Of note, the last time patient saw anything when she pumped was 3 weeks ago. At that time, she could get 1 oz with pumping. In the interim, she took 1.5 weeks off from pumping b/c of thrush. Patient says she started her period last week on 02-03-18 and it "was worse than a regular period" and at least on one day, it was "really, really heavy." She did let her OB/GYN know about the bleeding.  Patient mentions only minimal breast changes with pregnancy (nipple soreness/nipple tenderness). She did not notice an increase in cup size during pregnancy.   Patient is being followed by hematology for low platelets. Patient is on nifedipine. She also has a history of anemia.   I suggested to patient the possibility that her breasts have already involuted & her "plugged duct" areas could possibly be cysts. Patient was then interested in knowing how to bring her milk back in. Due to the complexity of her questions & the need for a breast exam, I suggested that she be seen again by Lactation. She stated that she has an appt for next week. I recommended follow-up sooner & sent an email to the secretary to request rescheduling.   Elinor Dodge, RN, IBCLC

## 2018-02-11 ENCOUNTER — Ambulatory Visit: Payer: 59

## 2018-02-11 ENCOUNTER — Inpatient Hospital Stay: Payer: 59 | Attending: Hematology and Oncology | Admitting: Nurse Practitioner

## 2018-02-11 ENCOUNTER — Other Ambulatory Visit: Payer: Self-pay | Admitting: Nurse Practitioner

## 2018-02-11 DIAGNOSIS — D696 Thrombocytopenia, unspecified: Secondary | ICD-10-CM

## 2018-02-19 ENCOUNTER — Ambulatory Visit: Payer: Self-pay

## 2018-02-19 NOTE — Lactation Note (Signed)
This note was copied from a baby's chart. 02/19/2018  Name: Maria Osborn MRN: [id number] Date of Birth: 12/26/2016 Gestational Age: Gestational Age: [redacted]w[redacted]d Birth Weight: 122.4 oz Weight today:    9 pounds 4 ounces (4196 grams) with clean newborn diaper  Infant presents today with mom for follow up assessment.   Infant has gained 316 grams in the last 18 days with an average daily weight gain of 18 grams a day. Infant has been changed to 22 calorie formula per ped and is still growing at the same rate. Mom reports if infant takes larger volumes he spits up.   Mom has not pumped in the last week as it became too much for mom to continue. We discussed most likely her breasts have involuted. Mom is planning on latching infant when she wants and then to continue to bottle feed infant. Mom does not wish to continue pumping or to take other medications.    Infant did not feed in the office, he was fed before he came. He was spitting up in the office with some coming out of his nose. Mom bulb suctioned infant. Infant pink throughout occurrence. Infant has had congestion over the last week.   Mom was treated with 1 dose of Oral Diflucan. She has still experienced some sharp pain to the breast on occasion. Mom has a lump on the outer left breast at about 3 o'clock that has been consistently present, advised mom to be evaluated by her OB to rule out cyst vs Abscess. Mom without fever or flu like symptoms.   Mom having some difficulty with her emotions related to not being able to BF and infant birth. She is seeing a therapist currently. Mom has good support of her husband.   Infant to follow up with Dr. Collene Gobble office on Monday 12/23. Mom to follow up with Lactation as needed or 1-5 days post tongue and lip releases if completed.   Mom to call with questions/concerns as needed.      General Information: Mother's reason for visit: Follow up feeding asessessment, no milk  supply Consult: Follow-up Lactation consultant: Ivin Booty Anelis Hrivnak RN,IBCLC Breastfeeding experience: BF a few times a day and uses SNS a few times a week, mostly bottle feeding Maternal medical conditions: Pregnancy induced hypertension(HTN post Delivery) Maternal medications: Pre-natal vitamin, Other(Nifedipine)  Breastfeeding History: Frequency of breast feeding: a few times a day Duration of feeding: 5-10 minutes  Supplementation: Supplement method: bottle(Avent Natural Flow) Brand: Enfamil(22 calorie formula) Formula volume: 3 ounces Formula frequency: 7 x a day Total formula volume per day: 21 ounces Breast milk volume: 0     Pump type: Other(MOtif) Pump frequency: stopped pumping last week    Infant Output Assessment: Voids per 24 hours: 7-8 Urine color: Clear yellow Stools per 24 hours: one every 1-2 days Stool color: Brown  Breast Assessment:          Feeding Assessment:   Infant oral assessment comment: Infant with tight labial frenulum that inserts at the bottom of the gum ridge, upper lip tight with flanging. Infant with weaker suckle on gloved finger with tongue thrusting noted. Infant with hump to back of tongue when suckling. Infant is more spitty. Mom reports infant suckle at the breast has weakened also. She has been reading about tongue and lip ties on line. Mom was shown oral structures and we discussed how this can effect feeding. Website information on tongue and lip restrictions and local providers given. Discussed with mom that  [name], SLP may also be helpful to have infant assessed by. Mom meeting with Ped on Monday to discuss possible reflux.                                 Additional Feeding Assessment:                                    Totals:       Plan:  1. Feed infant at the breast with feeding cues as mom and infant want 3. Massage/intermittenly compress breast with feedings to maximize milk flow 4. Use  the 5 french feeding tube at the breast as mom and infant want to help with breast stimulation.  5. Offer infant bottle of breast milk or formula post feeding  6. Infant needs about 77-103 ml (2.5-3.5 ounces) for 8 feedings a day or 615-820 ml (21-27 ounces) in the last 24 hours. Infant may take more or less depending on how often he feeds. Feed infant until he is satisfied.  7. May be helpful to have infant evaluated by Pediatric Dentist to rule out tongue and lip restrictions assessed 8. It may also be helpful to see [name], SLP at [facility]. Will need a referral from Pediatrician 9. Would advise that you call your OB to discuss breast lump 10. Keep up the good work 51. Thank you for allowing me to assist you today 12. Please call with any questions/concerns as needed [phone] 13. Follow up with Lactation as needed ar 1-5 days post tongue and lip releases if completed  [facility] RN, IBCLC                                                      Debby Freiberg Jullianna Gabor 02/19/2018, 11:29 AM

## 2018-12-22 ENCOUNTER — Telehealth: Payer: Self-pay | Admitting: Hematology

## 2018-12-22 NOTE — Telephone Encounter (Signed)
Received a new hem referral from Dr. Benjie Karvonen at [facility] for thrombocytopenia in pregnancy. Maria Osborn has been cld and scheduled to see Dr. Irene Limbo on 11/11 at 10am. Pt has been made aware to arrive 15 minutes early.

## 2019-01-12 ENCOUNTER — Inpatient Hospital Stay: Payer: Managed Care, Other (non HMO)

## 2019-01-12 ENCOUNTER — Other Ambulatory Visit: Payer: Self-pay

## 2019-01-12 ENCOUNTER — Inpatient Hospital Stay: Payer: Managed Care, Other (non HMO) | Attending: Hematology | Admitting: Hematology

## 2019-01-12 VITALS — BP 137/84 | HR 100 | Temp 97.9°F | Resp 18 | Ht 65.0 in | Wt 266.9 lb

## 2019-01-12 DIAGNOSIS — D693 Immune thrombocytopenic purpura: Secondary | ICD-10-CM

## 2019-01-12 DIAGNOSIS — O99113 Other diseases of the blood and blood-forming organs and certain disorders involving the immune mechanism complicating pregnancy, third trimester: Secondary | ICD-10-CM

## 2019-01-12 DIAGNOSIS — D649 Anemia, unspecified: Secondary | ICD-10-CM | POA: Insufficient documentation

## 2019-01-12 DIAGNOSIS — E538 Deficiency of other specified B group vitamins: Secondary | ICD-10-CM | POA: Insufficient documentation

## 2019-01-12 DIAGNOSIS — D696 Thrombocytopenia, unspecified: Secondary | ICD-10-CM | POA: Diagnosis present

## 2019-01-12 DIAGNOSIS — O99013 Anemia complicating pregnancy, third trimester: Secondary | ICD-10-CM | POA: Insufficient documentation

## 2019-01-12 LAB — CBC WITH DIFFERENTIAL (CANCER CENTER ONLY)
Abs Immature Granulocytes: 0.03 10*3/uL (ref 0.00–0.07)
Basophils Absolute: 0 10*3/uL (ref 0.0–0.1)
Basophils Relative: 0 %
Eosinophils Absolute: 0.1 10*3/uL (ref 0.0–0.5)
Eosinophils Relative: 1 %
HCT: 37.5 % (ref 36.0–46.0)
Hemoglobin: 12.9 g/dL (ref 12.0–15.0)
Immature Granulocytes: 0 %
Lymphocytes Relative: 15 %
Lymphs Abs: 1.2 10*3/uL (ref 0.7–4.0)
MCH: 29 pg (ref 26.0–34.0)
MCHC: 34.4 g/dL (ref 30.0–36.0)
MCV: 84.3 fL (ref 80.0–100.0)
Monocytes Absolute: 0.4 10*3/uL (ref 0.1–1.0)
Monocytes Relative: 5 %
Neutro Abs: 6 10*3/uL (ref 1.7–7.7)
Neutrophils Relative %: 79 %
Platelet Count: 88 10*3/uL — ABNORMAL LOW (ref 150–400)
RBC: 4.45 MIL/uL (ref 3.87–5.11)
RDW: 14.6 % (ref 11.5–15.5)
WBC Count: 7.7 10*3/uL (ref 4.0–10.5)
nRBC: 0 % (ref 0.0–0.2)

## 2019-01-12 LAB — CMP (CANCER CENTER ONLY)
ALT: 28 U/L (ref 0–44)
AST: 18 U/L (ref 15–41)
Albumin: 2.8 g/dL — ABNORMAL LOW (ref 3.5–5.0)
Alkaline Phosphatase: 95 U/L (ref 38–126)
Anion gap: 7 (ref 5–15)
BUN: 4 mg/dL — ABNORMAL LOW (ref 6–20)
CO2: 25 mmol/L (ref 22–32)
Calcium: 8.7 mg/dL — ABNORMAL LOW (ref 8.9–10.3)
Chloride: 105 mmol/L (ref 98–111)
Creatinine: 0.71 mg/dL (ref 0.44–1.00)
GFR, Est AFR Am: >60 mL/min (ref >60)
GFR, Estimated: >60 mL/min (ref >60)
Glucose, Bld: 92 mg/dL (ref 70–99)
Potassium: 3.9 mmol/L (ref 3.5–5.1)
Sodium: 137 mmol/L (ref 135–145)
Total Bilirubin: 0.7 mg/dL (ref 0.3–1.2)
Total Protein: 6.4 g/dL — ABNORMAL LOW (ref 6.5–8.1)

## 2019-01-12 NOTE — Progress Notes (Addendum)
HEMATOLOGY/ONCOLOGY CONSULTATION NOTE  Date of Service: 01/12/2019  Patient Care Team: [facility], [facility] as PCP - General  CHIEF COMPLAINTS/PURPOSE OF CONSULTATION:  Thrombocytopenia in pregnancy  HISTORY OF PRESENTING ILLNESS:   Maria Osborn is a wonderful 33 y.o. female who has been referred to [place] by Dr Benjie Karvonen for evaluation and management of thrombocytopenia in pregnancy. The pt reports that she is doing well overall.   The pt reports that she had a C-section with her first delivery on 12/26/2017 and she did not need PLT transfusion immediately following. Her PLTs normalized on their own around March of 2020. At this time she has a planned C-section for her current pregnancy, she is nearly 36 weeks. This is her second pregnancy. She had spinal anesthesia with her first C-section and will be looking to do the same with this birth. There was a concern of gestational HTN with her first pregnancy and she was placed on blood pressure medication for about 3 weeks during her second trimester but was taken off due to her BP normalizing. Pt has a history of anemia and Vitamin B12 deficiency but no other blood disorder concerns. Pt is current taking her prenatal vitamins and has taken a Tylenol for pain occasionally. She denies taking any other OTC pain medication. She has no history of bleeding concerns. She has no known medication allergies and no known family history of blood disorders. Pt did have a fibroid and endometrial tissue removal in 2017. She is a non-smoker and only drank alcohol occasionally before pregnancy. Pt is experiencing more back pain with this pregnancy than the first. Pt notes some minimal leg swelling that goes away after rest. 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 She is well-supported at home by her husband.   Most recent lab results (12/15/2018) of CBC is as follows: all  values are WNL except for PLTs at 96K, Lymphs Rel at 19.5, Immature Granulocytes Rel at 75.4. 12/16/2018 PLTs at 103K.  On review of systems, pt reports back pain, minimal leg swelling and denies infection symptoms, fevers, chills, night sweats, abdominal pain and any other symptoms.   On PMHx the pt reports gestational HTN, C-section, Fibroid and Endometrial tissue removal (2017), Anemia, Vitamin B12 deficiency. On Social Hx the pt reports she is a non-smoker, occasional EtOH use before pregnancy  MEDICAL HISTORY:  Past Medical History:  Diagnosis Date  . Anemia   . Fibroid   . Fibroids   . Headache     SURGICAL HISTORY: Past Surgical History:  Procedure Laterality Date  . CESAREAN SECTION N/A 12/26/2017   Procedure: CESAREAN SECTION;  Surgeon: Linda Hedges, DO;  Location: [facility];  Service: Obstetrics;  Laterality: N/A;  . DILATION AND CURETTAGE OF UTERUS    . HYSTEROSCOPY W/D&C N/A 06/10/2016   Procedure: DILATATION & CURETTAGE/HYSTEROSCOPY;  Surgeon: Linda Hedges, DO;  Location: [facility];  Service: Gynecology;  Laterality: N/A;  . MYOMECTOMY N/A 06/10/2016   Procedure: MYOMECTOMY, abdominal;  Surgeon: Linda Hedges, DO;  Location: [facility];  Service: Gynecology;  Laterality: N/A;    SOCIAL HISTORY: Social History   Socioeconomic History  . Marital status: Married    Spouse name: Not on file  . Number of children: 0  . Years of education: 15  . Highest education level: Not on file  Occupational History  . Occupation: [employer]  Social Needs  . Financial resource strain:  Not hard at all  . Food insecurity    Worry: Never true    Inability: Never true  . Transportation needs    Medical: No    Non-medical: No  Tobacco Use  . Smoking status: Never Smoker  . Smokeless tobacco: Never Used  Substance and Sexual Activity  . Alcohol use: Yes    Alcohol/week: 0.0 standard drinks    Comment: Socially  . Drug use: No  . Sexual activity: Yes    Birth  control/protection: None  Lifestyle  . Physical activity    Days per week: 3 days    Minutes per session: 30 min  . Stress: Not at all  Relationships  . Social connections    Talks on phone: More than three times a week    Gets together: More than three times a week    Attends religious service: More than 4 times per year    Active member of club or organization: No    Attends meetings of clubs or organizations: Never    Relationship status: Married  . Intimate partner violence    Fear of current or ex partner: No    Emotionally abused: No    Physically abused: No    Forced sexual activity: No  Other Topics Concern  . Not on file  Social History Narrative   Lives at home with boyfriend   Caffeine use: Drinks tea (4 cups per week)   No soda/coffee    FAMILY HISTORY: Family History  Problem Relation Age of Onset  . Migraines Father   . Stroke Mother   . Depression Mother   . Lupus Maternal Aunt   . Stroke Maternal Uncle     ALLERGIES:  has No Known Allergies.  MEDICATIONS:  Current Outpatient Medications  Medication Sig Dispense Refill  . docusate sodium (COLACE) 100 MG capsule Take 1 capsule (100 mg total) by mouth 2 (two) times daily. 30 capsule 2  . ferrous sulfate 325 (65 FE) MG tablet Take 325 mg by mouth daily with breakfast.    . oxyCODONE-acetaminophen (PERCOCET/ROXICET) 5-325 MG tablet Take 1 tablet by mouth every 4 (four) hours as needed (pain scale 4-7). 30 tablet 0  . Prenatal Vit-Fe Fumarate-FA (PRENATAL MULTIVITAMIN) TABS tablet Take 1 tablet by mouth daily at 12 noon.     No current facility-administered medications for this visit.    Facility-Administered Medications Ordered in Other Visits  Medication Dose Route Frequency Provider Last Rate Last Dose  . gadopentetate dimeglumine (MAGNEVIST) injection 20 mL  20 mL Intravenous Once PRN Melvenia Beam, MD        REVIEW OF SYSTEMS:    10 Point review of Systems was done is negative except as noted  above.  PHYSICAL EXAMINATION: ECOG PERFORMANCE STATUS: 0 - Asymptomatic  . Vitals:   01/12/19 1020  BP: 137/84  Pulse: 100  Resp: 18  Temp: 97.9 F (36.6 C)  SpO2: 99%   Filed Weights   01/12/19 1020  Weight: 266 lb 14.4 oz (121.1 kg)   .Body mass index is 44.41 kg/m.  GENERAL:alert, in no acute distress and comfortable SKIN: no acute rashes, no significant lesions EYES: conjunctiva are pink and non-injected, sclera anicteric OROPHARYNX: MMM, no exudates, no oropharyngeal erythema or ulceration NECK: supple, no JVD LYMPH:  no palpable lymphadenopathy in the cervical, axillary or inguinal regions LUNGS: clear to auscultation b/l with normal respiratory effort HEART: regular rate & rhythm ABDOMEN:  normoactive bowel sounds , non tender, not distended.  Extremity: trace pedal edema PSYCH: alert & oriented x 3 with fluent speech NEURO: no focal motor/sensory deficits  LABORATORY DATA:  I have reviewed the data as listed  . CBC Latest Ref Rng & Units 01/12/2019 12/29/2017 12/27/2017  WBC 4.0 - 10.5 K/uL 7.7 10.3 13.8(H)  Hemoglobin 12.0 - 15.0 g/dL 12.9 11.1(L) 14.4  Hematocrit 36.0 - 46.0 % 37.5 31.9(L) 40.7  Platelets 150 - 400 K/uL 88(L) 81(L) 105(L)    . CMP Latest Ref Rng & Units 01/12/2019 12/25/2017 06/11/2016  Glucose 70 - 99 mg/dL 92 97 101(H)  BUN 6 - 20 mg/dL 4(L) 7 8  Creatinine 0.44 - 1.00 mg/dL 0.71 0.52 0.84  Sodium 135 - 145 mmol/L 137 136 137  Potassium 3.5 - 5.1 mmol/L 3.9 4.0 3.5  Chloride 98 - 111 mmol/L 105 106 109  CO2 22 - 32 mmol/L 25 20(L) 22  Calcium 8.9 - 10.3 mg/dL 8.7(L) 9.5 8.4(L)  Total Protein 6.5 - 8.1 g/dL 6.4(L) 6.0(L) 6.5  Total Bilirubin 0.3 - 1.2 mg/dL 0.7 0.7 0.9  Alkaline Phos 38 - 126 U/L 95 145(H) 50  AST 15 - 41 U/L 18 29 16   ALT 0 - 44 U/L 28 23 12(L)   PLTs during 2nd pregnancy:   12/15/2018 96K  10/28/2018  111K  09/01/2018  117K  07/29/2018 119K    RADIOGRAPHIC STUDIES: I have personally reviewed the  radiological images as listed and agreed with the findings in the report. No results found.  ASSESSMENT & PLAN:   33 yo with   1) Thrombocytopenia in late 3rd trimester of pregnancy PLAN: -Discussed patient's most recent labs from 12/15/2018, all values are WNL except for PLTs at 96K, Lymphs Rel at 19.5, Immature Granulocytes Rel at 75.4. 12/16/2018 PLTs at 103K. -Advised pt that PLTs must be at 80K for spinal anesthesia, 50K for a C-section -Advised pt that thrombocytopenia does not appear to be an immune process - could be pseudothrombocytopenia or pregnancy-related thrombocytopenia -Will consider low-dose course of Prednisone to prevent the need for a PLT transfusion -Will get labs today  -Will see back as needed based on labs    FOLLOW UP: Labs today RTC with Dr Irene Limbo depending on lab results   All of the patients questions were answered with apparent satisfaction. The patient knows to call the clinic with any problems, questions or concerns.  I spent 40mins counseling the patient face to face. The total time spent in the appointment was 35 minutes and more than 50% was on counseling and direct patient cares.    Sullivan Lone MD [place] AAHIVMS [facility] Hematology/Oncology Physician [facility]  (Office):       [phone] (Work cell):  [phone] (Fax):           [phone]  01/12/2019 5:34 PM  I, Yevette Edwards, am acting as a scribe for Dr. Sullivan Lone.   .I have reviewed the above documentation for accuracy and completeness, and I agree with the above. Brunetta Genera MD   ADDENDUM  . CBC    Component Value Date/Time   WBC 7.7 01/12/2019 1101   WBC 10.3 12/29/2017 0508   RBC 4.45 01/12/2019 1101   HGB 12.9 01/12/2019 1101   HCT 37.5 01/12/2019 1101   PLT 88 (L) 01/12/2019 1101   MCV 84.3 01/12/2019 1101   MCH 29.0 01/12/2019 1101   MCHC 34.4 01/12/2019 1101   RDW 14.6 01/12/2019 1101   LYMPHSABS 1.2 01/12/2019 1101   MONOABS 0.4  01/12/2019 1101   EOSABS 0.1 01/12/2019 1101   BASOSABS 0.0 01/12/2019 1101   . CMP Latest Ref Rng & Units 01/12/2019 12/25/2017 06/11/2016  Glucose 70 - 99 mg/dL 92 97 101(H)  BUN 6 - 20 mg/dL 4(L) 7 8  Creatinine 0.44 - 1.00 mg/dL 0.71 0.52 0.84  Sodium 135 - 145 mmol/L 137 136 137  Potassium 3.5 - 5.1 mmol/L 3.9 4.0 3.5  Chloride 98 - 111 mmol/L 105 106 109  CO2 22 - 32 mmol/L 25 20(L) 22  Calcium 8.9 - 10.3 mg/dL 8.7(L) 9.5 8.4(L)  Total Protein 6.5 - 8.1 g/dL 6.4(L) 6.0(L) 6.5  Total Bilirubin 0.3 - 1.2 mg/dL 0.7 0.7 0.9  Alkaline Phos 38 - 126 U/L 95 145(H) 50  AST 15 - 41 U/L 18 29 16   ALT 0 - 44 U/L 28 23 12(L)   Patient did not get all the ordered labs PLT 88k at term -- adequate for anesthesia and C section PLAN -patient informed sh could come back for her remaining labs -no steroids or IVIG of rx recommended for mild thrombocytopenia -transfuse platelets prnto maintain plt >75k for spinal or epidural anesthesia -avodi NSAIDS rtc with dr Irene Limbo as needed

## 2019-01-18 ENCOUNTER — Telehealth: Payer: Self-pay | Admitting: *Deleted

## 2019-01-18 NOTE — Telephone Encounter (Signed)
Contacted 01/17/2019 by Jonelle Sidle from [place] (CB# [phone], [phone]) on behalf of Dr.Mody. Platelets were 88 last week (11/11) and Dr. Benjie Karvonen would like to know about treatment plan.  Per Dr. Irene Limbo: if PLT remain 88k no intervention recommended. On review, not all labs ordered were drawn - needs to get remaining labs as discussed if patient is agreeable. Contacted Jonelle Sidle and gave information as stated by Dr. Irene Limbo. Jonelle Sidle will give info to Dr. Benjie Karvonen and contact patient about additional labs.

## 2019-01-19 ENCOUNTER — Other Ambulatory Visit: Payer: Self-pay

## 2019-01-19 ENCOUNTER — Other Ambulatory Visit: Payer: Self-pay | Admitting: Obstetrics & Gynecology

## 2019-01-19 ENCOUNTER — Other Ambulatory Visit (HOSPITAL_COMMUNITY)
Admission: RE | Admit: 2019-01-19 | Discharge: 2019-01-19 | Disposition: A | Discharge status: Home / Self Care | Payer: Managed Care, Other (non HMO) | Source: Ambulatory Visit | Attending: Obstetrics & Gynecology | Admitting: Obstetrics & Gynecology

## 2019-01-19 ENCOUNTER — Other Ambulatory Visit (HOSPITAL_COMMUNITY)
Admission: RE | Admit: 2019-01-19 | Discharge: 2019-01-19 | Disposition: A | Discharge status: Home / Self Care | Payer: Managed Care, Other (non HMO) | Attending: Obstetrics & Gynecology | Admitting: Obstetrics & Gynecology

## 2019-01-19 DIAGNOSIS — Z01812 Encounter for preprocedural laboratory examination: Secondary | ICD-10-CM | POA: Insufficient documentation

## 2019-01-19 DIAGNOSIS — Z20828 Contact with and (suspected) exposure to other viral communicable diseases: Secondary | ICD-10-CM | POA: Insufficient documentation

## 2019-01-19 LAB — CBC
HCT: 39.5 % (ref 36.0–46.0)
Hemoglobin: 13.6 g/dL (ref 12.0–15.0)
MCH: 29.4 pg (ref 26.0–34.0)
MCHC: 34.4 g/dL (ref 30.0–36.0)
MCV: 85.3 fL (ref 80.0–100.0)
Platelets: 100 10*3/uL — ABNORMAL LOW (ref 150–400)
RBC: 4.63 MIL/uL (ref 3.87–5.11)
RDW: 15 % (ref 11.5–15.5)
WBC: 6.3 10*3/uL (ref 4.0–10.5)
nRBC: 0 % (ref 0.0–0.2)

## 2019-01-19 LAB — TYPE AND SCREEN
ABO/RH(D): A POS
Antibody Screen: NEGATIVE

## 2019-01-19 LAB — ABO/RH: ABO/RH(D): A POS

## 2019-01-19 LAB — SARS CORONAVIRUS 2 (TAT 6-24 HRS): SARS Coronavirus 2: NEGATIVE

## 2019-01-19 NOTE — MAU Note (Signed)
Covid swab collected. Pt tolerated well. PT asymptomatic 

## 2019-01-20 ENCOUNTER — Other Ambulatory Visit (HOSPITAL_COMMUNITY): Payer: Managed Care, Other (non HMO)

## 2019-01-20 LAB — RPR: RPR Ser Ql: NONREACTIVE

## 2019-01-20 NOTE — H&P (Signed)
Maria Osborn is a 33 y.o. female presenting for repeat C-section at 37.3 wks due to gestational thrombocytopenia.  Prior C/section for failure to dilate. IOL at 38 wks fr Gestational HTN.  This pregnancy complicated by Gestational HTN, was started on Labetalol but stopped due to dizziness. Back pain in pregnancy.  Gestational thrombocytopenia in prior pregnancy, started low at 110s in 1st trim and has gradually trended down to 76 at 37 wks on 01/19/19. Pt saw Hematologist, she was not advised steroids.  Growth for GHTN, q 4 wks in 3rd trim, AGA. PIH labs nl. NST reactive since 34 wks.  . OB History    Gravida  1   Para      Term      Preterm      AB      Living        SAB      TAB      Ectopic      Multiple      Live Births             Past Medical History:  Diagnosis Date  . Anemia   . Fibroid   . Fibroids   . Headache    Past Surgical History:  Procedure Laterality Date  . CESAREAN SECTION N/A 12/26/2017   Procedure: CESAREAN SECTION;  Surgeon: Linda Hedges, DO;  Location: [facility];  Service: Obstetrics;  Laterality: N/A;  . DILATION AND CURETTAGE OF UTERUS    . HYSTEROSCOPY W/D&C N/A 06/10/2016   Procedure: DILATATION & CURETTAGE/HYSTEROSCOPY;  Surgeon: Linda Hedges, DO;  Location: [facility];  Service: Gynecology;  Laterality: N/A;  . MYOMECTOMY N/A 06/10/2016   Procedure: MYOMECTOMY, abdominal;  Surgeon: Linda Hedges, DO;  Location: [facility];  Service: Gynecology;  Laterality: N/A;   Family History: family history includes Depression in her mother; Lupus in her maternal aunt; Migraines in her father; Stroke in her maternal uncle and mother. Social History:  reports that she has never smoked. She has never used smokeless tobacco. She reports current alcohol use. She reports that she does not use drugs.     Maternal Diabetes: No Genetic Screening: Normal Maternal Ultrasounds/Referrals: Normal Fetal Ultrasounds or other Referrals:   None Maternal Substance Abuse:  No Significant Maternal Medications:  Meds include: Other:  took Labetalol for few wks  Significant Maternal Lab Results:  Group B Strep negative Other Comments:  Gestational thrombocytopenia   ROS neg  History   unknown if currently breastfeeding. Exam Physical Exam   A&O x 3, no acute distress. Pleasant HEENT neg, no thyromegaly Lungs CTA bilat CV RRR, S1S2 normal Abdo soft, non tender, non acute Extr no edema/ tenderness Pelvic closed, long Cx, high station FHT  140s Toco none  Prenatal labs: ABO, Rh: --/--/A POS, A POS Performed at [facility] [address], [place], [place] [address]  [phone]) Antibody: NEG (11/18 1625) Rubella:  Immune RPR: NON REACTIVE (11/18 1625)  HBsAg:   neg HIV:   neg GBS:   Neg Glucola nl  Assessment/Plan: 33 yo, G2P1 37.2 wks, repeat c-section for prior c/section, early delivery due to worsening gestational thrombocytopenia  Risks/complications of surgery reviewed incl infection, bleeding, damage to internal organs including bladder, bowels, ureters, blood vessels, other risks from anesthesia, VTE and delayed complications of any surgery, complications in future surgery reviewed. Also discussed neonatal complications incl difficult delivery, laceration, vacuum assistance, TTN etc. Pt understands and agrees, all concerns addressed.    Maria Osborn  01/20/2019,   

## 2019-01-21 ENCOUNTER — Inpatient Hospital Stay (HOSPITAL_COMMUNITY): Payer: Managed Care, Other (non HMO) | Admitting: Anesthesiology

## 2019-01-21 ENCOUNTER — Other Ambulatory Visit: Payer: Self-pay

## 2019-01-21 ENCOUNTER — Inpatient Hospital Stay (HOSPITAL_COMMUNITY)
Admission: RE | Admit: 2019-01-21 | Discharge: 2019-01-23 | DRG: 788 | Disposition: A | Discharge status: Home / Self Care | Payer: Managed Care, Other (non HMO) | Attending: Obstetrics & Gynecology | Admitting: Obstetrics & Gynecology

## 2019-01-21 ENCOUNTER — Encounter (HOSPITAL_COMMUNITY)
Admission: RE | Disposition: A | Discharge status: Home / Self Care | Payer: Self-pay | Source: Home / Self Care | Attending: Obstetrics & Gynecology

## 2019-01-21 ENCOUNTER — Encounter (HOSPITAL_COMMUNITY): Payer: Self-pay | Admitting: Anesthesiology

## 2019-01-21 DIAGNOSIS — Z3A37 37 weeks gestation of pregnancy: Secondary | ICD-10-CM

## 2019-01-21 DIAGNOSIS — Z98891 History of uterine scar from previous surgery: Secondary | ICD-10-CM

## 2019-01-21 DIAGNOSIS — D6959 Other secondary thrombocytopenia: Secondary | ICD-10-CM | POA: Diagnosis present

## 2019-01-21 DIAGNOSIS — O134 Gestational [pregnancy-induced] hypertension without significant proteinuria, complicating childbirth: Secondary | ICD-10-CM | POA: Diagnosis present

## 2019-01-21 DIAGNOSIS — O99119 Other diseases of the blood and blood-forming organs and certain disorders involving the immune mechanism complicating pregnancy, unspecified trimester: Secondary | ICD-10-CM

## 2019-01-21 DIAGNOSIS — O34211 Maternal care for low transverse scar from previous cesarean delivery: Secondary | ICD-10-CM | POA: Diagnosis present

## 2019-01-21 DIAGNOSIS — O34219 Maternal care for unspecified type scar from previous cesarean delivery: Secondary | ICD-10-CM | POA: Diagnosis present

## 2019-01-21 DIAGNOSIS — Z20828 Contact with and (suspected) exposure to other viral communicable diseases: Secondary | ICD-10-CM | POA: Diagnosis present

## 2019-01-21 DIAGNOSIS — O99214 Obesity complicating childbirth: Secondary | ICD-10-CM | POA: Diagnosis present

## 2019-01-21 DIAGNOSIS — D696 Thrombocytopenia, unspecified: Secondary | ICD-10-CM | POA: Diagnosis present

## 2019-01-21 DIAGNOSIS — O9912 Other diseases of the blood and blood-forming organs and certain disorders involving the immune mechanism complicating childbirth: Secondary | ICD-10-CM | POA: Diagnosis present

## 2019-01-21 LAB — COMPREHENSIVE METABOLIC PANEL
ALT: 43 U/L (ref 0–44)
AST: 29 U/L (ref 15–41)
Albumin: 2.7 g/dL — ABNORMAL LOW (ref 3.5–5.0)
Alkaline Phosphatase: 97 U/L (ref 38–126)
Anion gap: 12 (ref 5–15)
BUN: <5 mg/dL — ABNORMAL LOW (ref 6–20)
CO2: 21 mmol/L — ABNORMAL LOW (ref 22–32)
Calcium: 8.9 mg/dL (ref 8.9–10.3)
Chloride: 105 mmol/L (ref 98–111)
Creatinine, Ser: 0.79 mg/dL (ref 0.44–1.00)
GFR calc Af Amer: >60 mL/min (ref >60)
GFR calc non Af Amer: >60 mL/min (ref >60)
Glucose, Bld: 74 mg/dL (ref 70–99)
Potassium: 4.1 mmol/L (ref 3.5–5.1)
Sodium: 138 mmol/L (ref 135–145)
Total Bilirubin: 0.8 mg/dL (ref 0.3–1.2)
Total Protein: 5.8 g/dL — ABNORMAL LOW (ref 6.5–8.1)

## 2019-01-21 LAB — PREPARE PLATELET PHERESIS: Unit division: 0

## 2019-01-21 LAB — CBC
HCT: 41.2 % (ref 36.0–46.0)
Hemoglobin: 14.3 g/dL (ref 12.0–15.0)
MCH: 29.5 pg (ref 26.0–34.0)
MCHC: 34.7 g/dL (ref 30.0–36.0)
MCV: 85.1 fL (ref 80.0–100.0)
Platelets: 100 10*3/uL — ABNORMAL LOW (ref 150–400)
RBC: 4.84 MIL/uL (ref 3.87–5.11)
RDW: 15.2 % (ref 11.5–15.5)
WBC: 7.5 10*3/uL (ref 4.0–10.5)
nRBC: 0.3 % — ABNORMAL HIGH (ref 0.0–0.2)

## 2019-01-21 LAB — BPAM PLATELET PHERESIS
Blood Product Expiration Date: 202011202359
Unit Type and Rh: 5100

## 2019-01-21 SURGERY — Surgical Case
Anesthesia: Spinal | Site: Abdomen | Wound class: Clean Contaminated

## 2019-01-21 MED ORDER — LACTATED RINGERS IV SOLN
INTRAVENOUS | Status: DC
  Administered 2019-01-21 – 2019-01-22 (×2): via INTRAVENOUS

## 2019-01-21 MED ORDER — SCOPOLAMINE 1 MG/3DAYS TD PT72
1.0000 | MEDICATED_PATCH | Freq: Once | TRANSDERMAL | Status: DC
  Administered 2019-01-21: 1.5 mg via TRANSDERMAL

## 2019-01-21 MED ORDER — WITCH HAZEL-GLYCERIN EX PADS: 1.0000 "application " | MEDICATED_PAD | CUTANEOUS | Status: DC | PRN

## 2019-01-21 MED ORDER — OXYTOCIN 10 UNIT/ML IJ SOLN
INTRAMUSCULAR | Status: DC | PRN
  Administered 2019-01-21: 40 [IU]

## 2019-01-21 MED ORDER — MORPHINE SULFATE (PF) 0.5 MG/ML IJ SOLN
INTRAMUSCULAR | Status: DC | PRN
  Administered 2019-01-21: .15 ug via INTRATHECAL

## 2019-01-21 MED ORDER — ZOLPIDEM TARTRATE 5 MG PO TABS: 5.0000 mg | ORAL_TABLET | Freq: Every evening | ORAL | Status: DC | PRN

## 2019-01-21 MED ORDER — NALBUPHINE SYRINGE 5 MG/0.5 ML
5.0000 mg | INJECTION | INTRAMUSCULAR | Status: DC | PRN
  Filled 2019-01-21: qty 0.5

## 2019-01-21 MED ORDER — FENTANYL CITRATE (PF) 100 MCG/2ML IJ SOLN
INTRAMUSCULAR | Status: AC
  Filled 2019-01-21: qty 2

## 2019-01-21 MED ORDER — OXYCODONE HCL 5 MG PO TABS
5.0000 mg | ORAL_TABLET | ORAL | Status: DC | PRN
  Administered 2019-01-21 – 2019-01-22 (×2): 5 mg via ORAL
  Administered 2019-01-22 – 2019-01-23 (×3): 10 mg via ORAL
  Filled 2019-01-21: qty 2
  Filled 2019-01-21: qty 1
  Filled 2019-01-21: qty 2
  Filled 2019-01-21: qty 1
  Filled 2019-01-21: qty 2

## 2019-01-21 MED ORDER — ONDANSETRON HCL 4 MG/2ML IJ SOLN
INTRAMUSCULAR | Status: AC
  Filled 2019-01-21: qty 2

## 2019-01-21 MED ORDER — SIMETHICONE 80 MG PO CHEW
80.0000 mg | CHEWABLE_TABLET | ORAL | Status: DC
  Administered 2019-01-21 – 2019-01-23 (×2): 80 mg via ORAL
  Filled 2019-01-21 (×2): qty 1

## 2019-01-21 MED ORDER — LACTATED RINGERS IV SOLN: INTRAVENOUS | Status: DC

## 2019-01-21 MED ORDER — OXYTOCIN 40 UNITS IN NORMAL SALINE INFUSION - SIMPLE MED
INTRAVENOUS | Status: AC
  Filled 2019-01-21: qty 1000

## 2019-01-21 MED ORDER — ONDANSETRON HCL 4 MG/2ML IJ SOLN
INTRAMUSCULAR | Status: DC | PRN
  Administered 2019-01-21: 4 mg via INTRAVENOUS

## 2019-01-21 MED ORDER — SIMETHICONE 80 MG PO CHEW
80.0000 mg | CHEWABLE_TABLET | Freq: Three times a day (TID) | ORAL | Status: DC
  Administered 2019-01-22 – 2019-01-23 (×5): 80 mg via ORAL
  Filled 2019-01-21 (×5): qty 1

## 2019-01-21 MED ORDER — FENTANYL CITRATE (PF) 100 MCG/2ML IJ SOLN
25.0000 ug | INTRAMUSCULAR | Status: DC | PRN
  Administered 2019-01-21: 25 ug via INTRAVENOUS

## 2019-01-21 MED ORDER — SIMETHICONE 80 MG PO CHEW
80.0000 mg | CHEWABLE_TABLET | ORAL | Status: DC | PRN
  Administered 2019-01-22: 80 mg via ORAL
  Filled 2019-01-21: qty 1

## 2019-01-21 MED ORDER — MEPERIDINE HCL 25 MG/ML IJ SOLN: 6.2500 mg | INTRAMUSCULAR | Status: DC | PRN

## 2019-01-21 MED ORDER — ACETAMINOPHEN 500 MG PO TABS
1000.0000 mg | ORAL_TABLET | Freq: Four times a day (QID) | ORAL | Status: DC
  Administered 2019-01-21 – 2019-01-23 (×8): 1000 mg via ORAL
  Filled 2019-01-21 (×8): qty 2

## 2019-01-21 MED ORDER — CEFAZOLIN SODIUM-DEXTROSE 2-4 GM/100ML-% IV SOLN
INTRAVENOUS | Status: AC
  Filled 2019-01-21: qty 100

## 2019-01-21 MED ORDER — LACTATED RINGERS IV SOLN
INTRAVENOUS | Status: DC | PRN
  Administered 2019-01-21 (×2): via INTRAVENOUS

## 2019-01-21 MED ORDER — NALBUPHINE SYRINGE 5 MG/0.5 ML
5.0000 mg | INJECTION | Freq: Once | INTRAMUSCULAR | Status: DC | PRN
  Filled 2019-01-21: qty 0.5

## 2019-01-21 MED ORDER — SCOPOLAMINE 1 MG/3DAYS TD PT72
MEDICATED_PATCH | TRANSDERMAL | Status: AC
  Filled 2019-01-21: qty 1

## 2019-01-21 MED ORDER — FENTANYL CITRATE (PF) 100 MCG/2ML IJ SOLN
INTRAMUSCULAR | Status: DC | PRN
  Administered 2019-01-21: 15 ug via INTRATHECAL

## 2019-01-21 MED ORDER — MORPHINE SULFATE (PF) 0.5 MG/ML IJ SOLN
INTRAMUSCULAR | Status: AC
  Filled 2019-01-21: qty 10

## 2019-01-21 MED ORDER — SENNOSIDES-DOCUSATE SODIUM 8.6-50 MG PO TABS
2.0000 | ORAL_TABLET | ORAL | Status: DC
  Administered 2019-01-21 – 2019-01-22 (×2): 2 via ORAL
  Filled 2019-01-21 (×2): qty 2

## 2019-01-21 MED ORDER — DIBUCAINE (PERIANAL) 1 % EX OINT: 1.0000 "application " | TOPICAL_OINTMENT | CUTANEOUS | Status: DC | PRN

## 2019-01-21 MED ORDER — METOCLOPRAMIDE HCL 5 MG/ML IJ SOLN
INTRAMUSCULAR | Status: DC | PRN
  Administered 2019-01-21: 10 mg via INTRAVENOUS

## 2019-01-21 MED ORDER — DEXTROSE 5 % IV SOLN
INTRAVENOUS | Status: DC | PRN
  Administered 2019-01-21: 3 g via INTRAVENOUS

## 2019-01-21 MED ORDER — OXYTOCIN 40 UNITS IN NORMAL SALINE INFUSION - SIMPLE MED: 2.5000 [IU]/h | INTRAVENOUS | Status: AC

## 2019-01-21 MED ORDER — DIPHENHYDRAMINE HCL 25 MG PO CAPS: 25.0000 mg | ORAL_CAPSULE | Freq: Four times a day (QID) | ORAL | Status: DC | PRN

## 2019-01-21 MED ORDER — PRENATAL MULTIVITAMIN CH
1.0000 | ORAL_TABLET | Freq: Every day | ORAL | Status: DC
  Administered 2019-01-22 – 2019-01-23 (×2): 1 via ORAL
  Filled 2019-01-21 (×2): qty 1

## 2019-01-21 MED ORDER — COCONUT OIL OIL: 1.0000 "application " | TOPICAL_OIL | Status: DC | PRN

## 2019-01-21 MED ORDER — BUPIVACAINE IN DEXTROSE 0.75-8.25 % IT SOLN
INTRATHECAL | Status: DC | PRN
  Administered 2019-01-21: 1.4 mL via INTRATHECAL

## 2019-01-21 MED ORDER — PHENYLEPHRINE HCL-NACL 20-0.9 MG/250ML-% IV SOLN
INTRAVENOUS | Status: DC | PRN
  Administered 2019-01-21: 60 ug/min via INTRAVENOUS

## 2019-01-21 MED ORDER — SODIUM CHLORIDE 0.9 % IV SOLN
INTRAVENOUS | Status: DC | PRN
  Administered 2019-01-21: 16:00:00 via INTRAVENOUS

## 2019-01-21 MED ORDER — PHENYLEPHRINE 40 MCG/ML (10ML) SYRINGE FOR IV PUSH (FOR BLOOD PRESSURE SUPPORT)
PREFILLED_SYRINGE | INTRAVENOUS | Status: AC
  Filled 2019-01-21: qty 10

## 2019-01-21 MED ORDER — TETANUS-DIPHTH-ACELL PERTUSSIS 5-2.5-18.5 LF-MCG/0.5 IM SUSP: 0.5000 mL | Freq: Once | INTRAMUSCULAR | Status: DC

## 2019-01-21 MED ORDER — SODIUM CHLORIDE 0.9% IV SOLUTION
Freq: Once | INTRAVENOUS | Status: AC
  Administered 2019-01-21: 14:00:00 via INTRAVENOUS

## 2019-01-21 MED ORDER — DEXAMETHASONE SODIUM PHOSPHATE 4 MG/ML IJ SOLN
INTRAMUSCULAR | Status: DC | PRN
  Administered 2019-01-21: 4 mg via INTRAVENOUS

## 2019-01-21 MED ORDER — PHENYLEPHRINE HCL-NACL 20-0.9 MG/250ML-% IV SOLN
INTRAVENOUS | Status: AC
  Filled 2019-01-21: qty 250

## 2019-01-21 MED ORDER — PROMETHAZINE HCL 25 MG/ML IJ SOLN: 6.2500 mg | INTRAMUSCULAR | Status: DC | PRN

## 2019-01-21 MED ORDER — DEXAMETHASONE SODIUM PHOSPHATE 4 MG/ML IJ SOLN
INTRAMUSCULAR | Status: AC
  Filled 2019-01-21: qty 7

## 2019-01-21 MED ORDER — MENTHOL 3 MG MT LOZG: 1.0000 | LOZENGE | OROMUCOSAL | Status: DC | PRN

## 2019-01-21 MED ORDER — METOCLOPRAMIDE HCL 5 MG/ML IJ SOLN
INTRAMUSCULAR | Status: AC
  Filled 2019-01-21: qty 2

## 2019-01-21 SURGICAL SUPPLY — 40 items
BARRIER ADHS 3X4 INTERCEED (GAUZE/BANDAGES/DRESSINGS) ×2 IMPLANT
BENZOIN TINCTURE PRP APPL 2/3 (GAUZE/BANDAGES/DRESSINGS) ×2 IMPLANT
CHLORAPREP W/TINT 26ML (MISCELLANEOUS) ×3 IMPLANT
CLAMP CORD UMBIL (MISCELLANEOUS) IMPLANT
CLOSURE WOUND 1/2 X4 (GAUZE/BANDAGES/DRESSINGS) ×1
CLOTH BEACON ORANGE TIMEOUT ST (SAFETY) ×3 IMPLANT
DRSG OPSITE POSTOP 4X10 (GAUZE/BANDAGES/DRESSINGS) ×3 IMPLANT
ELECT REM PT RETURN 9FT ADLT (ELECTROSURGICAL) ×3
ELECTRODE REM PT RTRN 9FT ADLT (ELECTROSURGICAL) ×1 IMPLANT
EXTRACTOR VACUUM KIWI (MISCELLANEOUS) IMPLANT
EXTRACTOR VACUUM M CUP 4 TUBE (SUCTIONS) IMPLANT
EXTRACTOR VACUUM M CUP 4' TUBE (SUCTIONS)
GAUZE SPONGE 4X4 12PLY STRL LF (GAUZE/BANDAGES/DRESSINGS) ×4 IMPLANT
GLOVE BIO SURGEON STRL SZ7 (GLOVE) ×3 IMPLANT
GLOVE BIOGEL PI IND STRL 7.0 (GLOVE) ×2 IMPLANT
GLOVE BIOGEL PI INDICATOR 7.0 (GLOVE) ×4
GOWN STRL REUS W/TWL LRG LVL3 (GOWN DISPOSABLE) ×6 IMPLANT
KIT ABG SYR 3ML LUER SLIP (SYRINGE) IMPLANT
NDL HYPO 25X5/8 SAFETYGLIDE (NEEDLE) IMPLANT
NEEDLE HYPO 25X5/8 SAFETYGLIDE (NEEDLE) IMPLANT
NS IRRIG 1000ML POUR BTL (IV SOLUTION) ×3 IMPLANT
PACK C SECTION WH (CUSTOM PROCEDURE TRAY) ×3 IMPLANT
PAD ABD 7.5X8 STRL (GAUZE/BANDAGES/DRESSINGS) ×2 IMPLANT
PAD OB MATERNITY 4.3X12.25 (PERSONAL CARE ITEMS) ×3 IMPLANT
RTRCTR C-SECT PINK 25CM LRG (MISCELLANEOUS) IMPLANT
STRIP CLOSURE SKIN 1/2X4 (GAUZE/BANDAGES/DRESSINGS) ×1 IMPLANT
SUT MNCRL 0 VIOLET CTX 36 (SUTURE) ×2 IMPLANT
SUT MONOCRYL 0 CTX 36 (SUTURE) ×4
SUT PLAIN 0 NONE (SUTURE) IMPLANT
SUT PLAIN 2 0 (SUTURE)
SUT PLAIN ABS 2-0 CT1 27XMFL (SUTURE) IMPLANT
SUT VIC AB 0 CT1 27 (SUTURE) ×4
SUT VIC AB 0 CT1 27XBRD ANBCTR (SUTURE) ×2 IMPLANT
SUT VIC AB 2-0 CT1 27 (SUTURE) ×2
SUT VIC AB 2-0 CT1 TAPERPNT 27 (SUTURE) ×1 IMPLANT
SUT VIC AB 4-0 KS 27 (SUTURE) ×3 IMPLANT
SUT VICRYL 0 TIES 12 18 (SUTURE) IMPLANT
TOWEL OR 17X24 6PK STRL BLUE (TOWEL DISPOSABLE) ×3 IMPLANT
TRAY FOLEY W/BAG SLVR 14FR LF (SET/KITS/TRAYS/PACK) IMPLANT
WATER STERILE IRR 1000ML POUR (IV SOLUTION) ×3 IMPLANT

## 2019-01-21 NOTE — Anesthesia Preprocedure Evaluation (Addendum)
Anesthesia Evaluation  Patient identified by MRN, date of birth, ID band Patient awake    Reviewed: Allergy & Precautions, NPO status , Patient's Chart, lab work & pertinent test results  History of Anesthesia Complications Negative for: history of anesthetic complications  Airway Mallampati: II  TM Distance: >3 FB Neck ROM: Full    Dental no notable dental hx. (+) Dental Advisory Given   Pulmonary neg pulmonary ROS,    Pulmonary exam normal        Cardiovascular negative cardio ROS Normal cardiovascular exam     Neuro/Psych  Headaches, negative psych ROS   GI/Hepatic negative GI ROS, Neg liver ROS,   Endo/Other  Morbid obesity  Renal/GU negative Renal ROS  negative genitourinary   Musculoskeletal negative musculoskeletal ROS (+)   Abdominal   Peds negative pediatric ROS (+)  Hematology negative hematology ROS (+)   Anesthesia Other Findings   Reproductive/Obstetrics negative OB ROS                            Anesthesia Physical  Anesthesia Plan  ASA: III  Anesthesia Plan: Spinal   Post-op Pain Management:    Induction:   PONV Risk Score and Plan: 2 and Treatment may vary due to age or medical condition, Ondansetron and Dexamethasone  Airway Management Planned: Natural Airway  Additional Equipment:   Intra-op Plan:   Post-operative Plan:   Informed Consent: I have reviewed the patients History and Physical, chart, labs and discussed the procedure including the risks, benefits and alternatives for the proposed anesthesia with the patient or authorized representative who has indicated his/her understanding and acceptance.     Dental advisory given  Plan Discussed with: CRNA and Anesthesiologist  Anesthesia Plan Comments:        Anesthesia Quick Evaluation

## 2019-01-21 NOTE — Transfer of Care (Signed)
Immediate Anesthesia Transfer of Care Note  Patient: Maria Osborn  Procedure(s) Performed: CESAREAN SECTION (N/A Abdomen)  Patient Location: PACU  Anesthesia Type:Spinal  Level of Consciousness: awake, alert  and oriented  Airway & Oxygen Therapy: Patient Spontanous Breathing  Post-op Assessment: Report given to RN and Post -op Vital signs reviewed and stable  Post vital signs: Reviewed and stable  Last Vitals:  Vitals Value Taken Time  BP 140/58 01/21/19 1700  Temp    Pulse 95 01/21/19 1703  Resp 18 01/21/19 1703  SpO2 100 % 01/21/19 1703  Vitals shown include unvalidated device data.  Last Pain:  Vitals:   01/21/19 1346  TempSrc: Oral         Complications: No apparent anesthesia complications

## 2019-01-21 NOTE — Op Note (Signed)
01/21/2019  Procedure: Repeat Low Transverse Cesarean Section  [name]  Indications: Scheduled Proceedure/Maternal Request                       37.[redacted] weeks Gestational Thrombocytopenia   Pre-operative Diagnosis: thrombocytopenia pregnancy, previous cesarean section.   Post-operative Diagnosis: Same   Surgeon:  Azucena Fallen, MD   Assistants: Artelia Laroche, CNM  Anesthesia: spinal   Procedure Details:  The patient was seen in the Holding Room. The risks, benefits, complications, treatment options, and expected outcomes were discussed with the patient. The patient concurred with the proposed plan, giving informed consent. identified as Maria Osborn and the procedure verified as C-Section Delivery. A Time Out was held and the above information confirmed. 3 gm Ancef given.  After induction of anesthesia, the patient was draped and prepped in the usual sterile manner, foley was draining urine well. Tracksee placed to retract pannus. A pfannenstiel incision was made and carried down through the subcutaneous tissue to the fascia. Fascial incision was made and extended transversely. The fascia was separated from the underlying rectus tissue superiorly and inferiorly. The peritoneum was identified and entered. Peritoneal incision was extended longitudinally. Alexis-O retractor placed.  A low transverse uterine incision was made. Delivered from cephalic presentation was a Female infant with vigorous cry. Apgar scores of 8 at one minute and 8 at five minutes. Delayed cord clamping done at 1 minute and baby handed to NICU team in attendance. Cord ph was not sent. Cord blood was obtained for evaluation. The placenta was removed Intact and appeared normal. The uterine outline, tubes and ovaries appeared normal. The uterine incision was closed with running locked sutures of 0Monocryl. A second imbricating layer sutured. Hemostasis was observed. Alexis retractor removed. Peritoneal  closure done with 2-0 Vicryl.  The fascia was then reapproximated with running sutures of 0Vicryl. The subcuticular closure was performed using 2-0plain gut. The skin was closed with 4-0Vicryl. Instrument, sponge, and needle counts were correct prior the abdominal closure and were correct at the conclusion of the case. Sterile dressing and pressure dressing placed.   Findings: Female infant delivered cephalic via [place] hysterotomy. Thick lower segment. 2 layer closure. Normal tubes, ovaries. Normal placenta, cord.    Estimated Blood Loss: 600 cc   Total IV Fluids: 2500 cc LR  Urine Output: 150CC OF clear urine  Specimens: cord blood    Complications: no complications  Disposition: PACU - hemodynamically stable.   Maternal Condition: stable   Baby condition / location:  Couplet care / Skin to Skin  Attending Attestation: I performed the procedure.   Signed: Surgeon(s): Azucena Fallen, MD

## 2019-01-21 NOTE — Anesthesia Procedure Notes (Signed)
Spinal  Patient location during procedure: OR Start time: 01/21/2019 3:09 PM End time: 01/21/2019 3:19 PM Preanesthetic Checklist Completed: patient identified, site marked, surgical consent, pre-op evaluation, timeout performed, IV checked, risks and benefits discussed and monitors and equipment checked Spinal Block Patient position: sitting Prep: DuraPrep Patient monitoring: heart rate, cardiac monitor, continuous pulse ox and blood pressure Approach: midline Location: L3-4 Injection technique: single-shot Needle Needle type: Sprotte  Needle gauge: 24 G Needle length: 9 cm Assessment Sensory level: T4

## 2019-01-21 NOTE — Anesthesia Postprocedure Evaluation (Signed)
Anesthesia Post Note  Patient: Maria Osborn  Procedure(s) Performed: CESAREAN SECTION (N/A Abdomen)     Patient location during evaluation: PACU Anesthesia Type: Spinal Level of consciousness: awake and alert and oriented Pain management: pain level controlled Vital Signs Assessment: post-procedure vital signs reviewed and stable Respiratory status: spontaneous breathing, nonlabored ventilation and respiratory function stable Cardiovascular status: blood pressure returned to baseline Postop Assessment: no apparent nausea or vomiting, spinal receding, no headache and no backache Anesthetic complications: no    Last Vitals:  Vitals:   01/21/19 1745 01/21/19 1800  BP: 120/66 132/63  Pulse: 99 89  Resp: (!) 22 17  Temp:  36.6 C  SpO2: 98% 99%    Last Pain:  Vitals:   01/21/19 1800  TempSrc: Oral  PainSc: 2     LLE Motor Response: Purposeful movement (01/21/19 1800) LLE Sensation: Tingling (01/21/19 1800) RLE Motor Response: Purposeful movement (01/21/19 1800) RLE Sensation: Tingling (01/21/19 1800)      Brennan Bailey

## 2019-01-21 NOTE — Progress Notes (Signed)
SBAR report called to [employer], ETA to room 403 approx 61min - [id number]

## 2019-01-22 ENCOUNTER — Encounter (HOSPITAL_COMMUNITY): Payer: Self-pay | Admitting: Obstetrics & Gynecology

## 2019-01-22 LAB — CBC
HCT: 35 % — ABNORMAL LOW (ref 36.0–46.0)
Hemoglobin: 12 g/dL (ref 12.0–15.0)
MCH: 29.5 pg (ref 26.0–34.0)
MCHC: 34.3 g/dL (ref 30.0–36.0)
MCV: 86 fL (ref 80.0–100.0)
Platelets: 93 10*3/uL — ABNORMAL LOW (ref 150–400)
RBC: 4.07 MIL/uL (ref 3.87–5.11)
RDW: 15.1 % (ref 11.5–15.5)
WBC: 11 10*3/uL — ABNORMAL HIGH (ref 4.0–10.5)
nRBC: 0.2 % (ref 0.0–0.2)

## 2019-01-22 MED ORDER — DIPHENHYDRAMINE HCL 25 MG PO CAPS: 25.0000 mg | ORAL_CAPSULE | ORAL | Status: DC | PRN

## 2019-01-22 MED ORDER — ONDANSETRON HCL 4 MG/2ML IJ SOLN: 4.0000 mg | Freq: Three times a day (TID) | INTRAMUSCULAR | Status: DC | PRN

## 2019-01-22 MED ORDER — DIPHENHYDRAMINE HCL 50 MG/ML IJ SOLN: 12.5000 mg | INTRAMUSCULAR | Status: DC | PRN

## 2019-01-22 MED ORDER — SODIUM CHLORIDE 0.9% FLUSH: 3.0000 mL | INTRAVENOUS | Status: DC | PRN

## 2019-01-22 MED ORDER — NALOXONE HCL 0.4 MG/ML IJ SOLN: 0.4000 mg | INTRAMUSCULAR | Status: DC | PRN

## 2019-01-22 MED ORDER — NALOXONE HCL 4 MG/10ML IJ SOLN
1.0000 ug/kg/h | INTRAVENOUS | Status: DC | PRN
  Filled 2019-01-22: qty 5

## 2019-01-22 NOTE — Lactation Note (Signed)
This note was copied from a baby's chart. Lactation Consultation Note Attempted to see mom 2 times, once RN working w/mom and 2nd time, mom resting.  Asked mom to call when needed.  Patient Name: Maria Osborn [id number] Date: 01/22/2019     Maternal Data    Feeding Feeding Type: Breast Fed  LATCH Score Latch: Repeated attempts needed to sustain latch, nipple held in mouth throughout feeding, stimulation needed to elicit sucking reflex.(per mom)  Audible Swallowing: Spontaneous and intermittent  Type of Nipple: Everted at rest and after stimulation  Comfort (Breast/Nipple): Soft / non-tender  Hold (Positioning): Assistance needed to correctly position infant at breast and maintain latch.  LATCH Score: 8  Interventions Interventions: Assisted with latch;Adjust position;Position options;Support pillows  Lactation Tools Discussed/Used     Consult Status      Theodoro Kalata 01/22/2019, 4:49 AM

## 2019-01-22 NOTE — Progress Notes (Signed)
POSTOPERATIVE DAY # 1 S/P Repeat LTCS, gestational thrombocytopenia, baby girl "[place]"   S:         Reports feeling tired and sore with movement   Denies HA, visual changes, RUQ/epigastric pain              Tolerating po intake / some nausea yesterday, but none today / no vomiting / no flatus / no BM  Denies dizziness, SOB, or CP             Bleeding is moderate             Pain controlled with Tylenol and Oxycodone             Up ad lib / ambulatory/ foley catheter in place  Newborn breast feeding - latching well, seeing colostrum   Requesting abdominal binder    O:  VS: BP 125/71 (BP Location: Right Arm)   Pulse 70   Temp 97.8 F (36.6 C) (Oral)   Resp 19   Ht 5\' 5"  (1.651 m)   Wt 122 kg   SpO2 99%   Breastfeeding Unknown   BMI 44.76 kg/m  01/22/19 0600  97.8 F (36.6 C)  70  -  19  125/71  Semi-fowlers  -  -  -  -  - BM   01/22/19 0222  97.6 F (36.4 C)  -  -  16  136/72  Semi-fowlers  99 %  -  -  -  - BM   01/21/19 2300  97.6 F (36.4 C)  -  -  18  -  Orthostatic Vitals  98 %  -  -  -  - BM   01/21/19 2140  97.7 F (36.5 C)  65  -  16  143/80Abnormal   Semi-fowlers  99 %  -  -  -  - BM   01/21/19 2020  97.7 F (36.5 C)  90  -  18  140/87  Semi-fowlers  98 %  -  -  -  - BM   01/21/19 1920  97.6 F (36.4 C)  79  -  20  124/78  Semi-fowlers  97 %  -  -  -  - BM   01/21/19 1808  97.8 F (36.6 C)  91  -  18  127/65  Semi-fowlers  97 %  Room Air  -  -  - CW   01/21/19 1800  97.8 F (36.6 C)  89  90  17  132/63  Semi-fowlers  99 %  Room Air  -  -  - CW      LABS:               Recent Labs    01/21/19 1406 01/22/19 0455  WBC 7.5 11.0*  HGB 14.3 12.0  PLT 100* 93*               Bloodtype: --/--/A POS, A POS Performed at [facility], [place] [address], [place], [place] [address]  [phone])  Rubella:                                               I&O: Intake/Output      11/20 0701 - 11/21 0700 11/21 0701 - 11/22 0700   I.V. (mL/kg) 3400 (27.9)    Total  Intake(mL/kg)  3400 (27.9)    Urine (mL/kg/hr) 2235    Blood 620    Total Output 2855    Net +545                      Physical Exam:             Alert and Oriented X3  Lungs: Clear and unlabored  Heart: regular rate and rhythm / no murmurs  Abdomen: soft, non-tender, obese, moderate gaseous distention, hypoactive bowel sounds in all quadrants              Fundus: firm, non-tender, U-2             Dressing: pressure dressing in place c/d/i              Incision:  approximated with sutures/ incision not visualized due to pressure dressing  Perineum: intact  Lochia: small rubra noted on pad   Extremities: +1 right leg edema, no edema noted on left leg, no calf pain or tenderness,   A/P:     POD # 1 S/P Repeat LTCS              Gestational Thrombocytopenia    - no NSAIDs   - repeat CBC in AM  Gestational Hypertension    - stopped labetalol during pregnancy due to diszziness    - BPs stable off meds, no neural s/s or evidence of PEC  SCDs while in bed             Routine postoperative care              See lactation today   Ambulation encouraged  Warm liquids and ambulation to promote bowel motility   Abdominal binder   D/C foley catheter   May shower today and remove pressure dressing after shower  Continue current care   Consider early d/c home tomorrow If mom and baby stable  Lars Pinks, MSN, CNM [facility]

## 2019-01-23 MED ORDER — OXYCODONE HCL 5 MG PO TABS: 5.0000 mg | ORAL_TABLET | Freq: Four times a day (QID) | ORAL | 0 refills | Status: AC | PRN

## 2019-01-23 MED ORDER — LABETALOL HCL 100 MG PO TABS: 100.0000 mg | ORAL_TABLET | Freq: Three times a day (TID) | ORAL | 0 refills | Status: DC

## 2019-01-23 MED ORDER — ACETAMINOPHEN 500 MG PO TABS: 1000.0000 mg | ORAL_TABLET | Freq: Four times a day (QID) | ORAL | 0 refills | Status: DC

## 2019-01-23 NOTE — Discharge Summary (Signed)
Obstetric Discharge Summary Reason for Admission: cesarean section- repeat at 37.2 wks for Gestational Thrombocytopenia. Gestational Hypertension, took Labetalol for 2 wks in 2nd trimester, stopped as BPs improved with more rest. Severe back/hip pain leading to stopping work. Saw chiropractor in pregnancy.  Prenatal Procedures: NST, ultrasound and BPP. Serial CBC, Hematologist consult Intrapartum Procedures: cesarean: low cervical, transverse Postpartum Procedures: none Complications-Operative and Postpartum: BP trending up on postpartum day 2. Patient desiring discharge, BP in mild elevated range, so will start Labetalol 100 mg TID and will report BP to office tomorrow. Daily BP checks and f/up Dr Benjie Karvonen 1 wk     CBC Latest Ref Rng & Units 01/22/2019 01/21/2019 01/19/2019  WBC 4.0 - 10.5 K/uL 11.0(H) 7.5 6.3  Hemoglobin 12.0 - 15.0 g/dL 12.0 14.3 13.6  Hematocrit 36.0 - 46.0 % 35.0(L) 41.2 39.5  Platelets 150 - 400 K/uL 93(L) 100(L) 100(L)    Physical Exam:  BP (!) 149/79 (BP Location: Right Arm)   Pulse 89   Temp 99.1 F (37.3 C) (Axillary)   Resp 16   Ht 5\' 5"  (1.651 m)   Wt 122 kg   SpO2 99%   Breastfeeding Unknown   BMI 44.76 kg/m  General: alert and cooperative  Lungs CTA bilateral CV RRR Lochia: appropriate Uterine Fundus: firm Incision: healing well. Pannus swelling, abdominal binder will help  DVT Evaluation: No evidence of DVT seen on physical exam, DTR +2.  Discharge Diagnoses: Term Pregnancy-delivered and Gestational thrombocytopenia. Gestational HTN, no resolved, postpartum HTN  Discharge Information: Date: 01/23/2019 Activity: pelvic rest Diet: routine Medications: PNV and Tylenol and Oxycodone. CANNOT take Ibuprofen until platelets improved Condition: stable Instructions: refer to practice specific booklet Discharge to: home Follow-up Information    Azucena Fallen, MD Follow up in 1 week(s).   Specialty: Obstetrics and Gynecology Contact  information: [place] [address] [phone]           Newborn Data: Live born female  Birth Weight: 6 lb 15.1 oz (3150 g) APGAR: 84, 8  Newborn Delivery   Birth date/time: 01/21/2019 15:54:00 Delivery type: C-Section, Low Transverse Trial of labor: No C-section categorization: Repeat      Home with mother.  Maria Osborn 01/23/2019, 1:18 PM

## 2019-01-23 NOTE — Lactation Note (Addendum)
This note was copied from a baby's chart. Lactation Consultation Note Baby 65 hrs old. Mom stated she had just finished bf for 20 min. Mom states baby is BF well to Rt. Breast and Lt. Breast mom stated the baby doesn't like it as much neither did her sone. Mom has a 32 yr old that she BF for 3 months then her milk dried up. Mom stated it took 2 weeks for her milk to come in then she still had to supplement. When she pumped she didn't get but an oz. Total from both breast. Mom stated she can't get any colostrum when she hand expresses.  Mom has everted nipples. Lt. Nipple has a lump of nipple tissue beside it making it larger. LC massaged breast and tried hand expression for several minutes nothing noted. Discussed importance of pumping for stimulation and induction of Lactogenesis.  Mom in agreement. Mom stated she has started to supplement d/t had been a long time since baby had voided.  LC discussed the need of supplementation likely until milk supply comes. Strongly encouraged to BF before supplementing and strict I&O. Newborn behavior, feeding habits, STS, I&O, breast massage, supply and demand discussed. Mom encouraged to feed baby 8-12 times/24 hours and with feeding cues.  Mom shown how to use DEBP & how to disassemble, clean, & reassemble parts. Mom knows to pump q3h for 15-20 min. Mom latched baby to [place]. Breast. LC saw latch, it was good. No swallows heard or seen. Mom feeding in cross cradle position. Discussed importance of support and comfort while BF. Mom pumping when left. Nothing noted. Encouraged to hand express after pumping. Lactation brochure given. Encouraged to call for assistance or questions.  Patient Name: Maria Osborn [id number] Date: 01/23/2019 Reason for consult: Initial assessment   Maternal Data Has patient been taught Hand Expression?: Yes Does the patient have breastfeeding experience prior to this delivery?: Yes  Feeding Feeding Type: Breast  Fed  LATCH Score Latch: Grasps breast easily, tongue down, lips flanged, rhythmical sucking.  Audible Swallowing: None  Type of Nipple: Everted at rest and after stimulation  Comfort (Breast/Nipple): Soft / non-tender  Hold (Positioning): No assistance needed to correctly position infant at breast.  LATCH Score: 8  Interventions Interventions: Breast feeding basics reviewed;DEBP;Support pillows;Skin to skin;Position options;Breast massage;Hand express;Breast compression  Lactation Tools Discussed/Used Tools: Pump Breast pump type: Double-Electric Breast Pump Pump Review: Setup, frequency, and cleaning;Milk Storage Initiated by:: Allayne Stack RN IBCLC Date initiated:: 01/23/19   Consult Status Consult Status: Follow-up Date: 01/23/19 Follow-up type: In-patient    Theodoro Kalata 01/23/2019, 12:31 AM

## 2019-01-23 NOTE — Lactation Note (Signed)
This note was copied from a baby's chart. Lactation Consultation Note  Patient Name: Maria Osborn [id number] Date: 01/23/2019 Reason for consult: Follow-up assessment;Infant weight loss;Other (Comment);Term(6 % weight loss) Baby is 74 hour old as LC entered the room baby asleep in the crib.  Mom preparing for D/C .  Per mom  breast feeding and latching is going well, nipples are alittle sensitive, And breast are heavier today. Volo reassured mom those are signs her mature milk is coming in. [facility] recommended and encouraged mom to use her EBM to nipples and coconut oil if needed. Also recommended as a preventive measure piror to latching moist heat for a few minutes, breast massage, hand express, pre- pump if needed and latch with firm support.  Sore nipple and engorgement prevention and tx reviewed, and storage of breast milk.  Mom has pamphlet for Lactation services.      Maternal Data    Feeding Feeding Type: (per mom feels comfortable with the latch and plans to feed)  LATCH Score                   Interventions Interventions: Breast feeding basics reviewed  Lactation Tools Discussed/Used Tools: Pump Breast pump type: Double-Electric Breast Pump Pump Review: Milk Storage   Consult Status Consult Status: Complete Date: 01/23/19    Myer Haff 01/23/2019, 11:55 AM

## 2019-01-23 NOTE — Progress Notes (Signed)
POSTOPERATIVE DAY # 2 S/P Repeat LTCS, gestational thrombocytopenia, baby girl "[place]"   S:         Reports feeling tired and sore with movement   Denies HA, visual changes, RUQ/epigastric pain              Tolerating po intake / some nausea yesterday, but none today / no vomiting / no flatus / no BM  Denies dizziness, SOB, or CP             Bleeding is moderate             Pain controlled with Tylenol and Oxycodone             Up ad lib / ambulatory/ foley catheter in place  Newborn breast feeding - latching well, seeing colostrum   Requesting discharge    O:  VS: BP (!) 149/79 (BP Location: Right Arm)   Pulse 89   Temp 99.1 F (37.3 C) (Axillary)   Resp 16   Ht 5\' 5"  (1.651 m)   Wt 122 kg   SpO2 99%   Breastfeeding Unknown   BMI 44.76 kg/m   Patient Vitals for the past 24 hrs:  BP Temp Temp src Pulse Resp  01/23/19 0510 (!) 149/79 99.1 F (37.3 C) Axillary 89 16  01/22/19 2213 (!) 144/93 99 F (37.2 C) Oral 92 18  01/22/19 1342 124/82 97.8 F (36.6 C) Oral 78 18     LABS:               CBC Latest Ref Rng & Units 01/22/2019 01/21/2019 01/19/2019  WBC 4.0 - 10.5 K/uL 11.0(H) 7.5 6.3  Hemoglobin 12.0 - 15.0 g/dL 12.0 14.3 13.6  Hematocrit 36.0 - 46.0 % 35.0(L) 41.2 39.5  Platelets 150 - 400 K/uL 93(L) 100(L) 100(L)                Bloodtype: --/--/A POS, A POS Performed at [facility], [place] [address] [place], [place] [address]  (11/18 1625)  Rubella:       Immune                            Physical Exam:             Alert and Oriented X3  Lungs: Clear and unlabored  Heart: regular rate and rhythm / no murmurs  Abdomen: soft, non-tender, obese, moderate gaseous distention, hypoactive bowel sounds in all quadrants              Fundus: firm, non-tender, U-2             Dressing: pressure dressing in place c/d/i              Incision:  Approximated. Needs dressing change before discharge   Perineum: intact  Lochia: small rubra noted on pad   Extremities:  +1 right leg edema, no edema noted on left leg, no calf pain or tenderness,   A/P:     POD # 2 S/P Repeat LTCS              Gestational Thrombocytopenia   Gestational Hypertension    -restart Labetalol for mild range BPs   SCDs while in bed. AMBULATE to reduce risk of VTE with this BMI reviewed, could not give prophylactic Lovenox post-op due to low platelets             PP care and post op  care reviewed Fup 1 wk for BP check and CBC  Discharge today if baby getting dc too.   V.Valma Rotenberg, MD

## 2019-01-23 NOTE — Discharge Instructions (Signed)
Postpartum Hypertension Postpartum hypertension is high blood pressure that remains higher than normal after childbirth. You may not realize that you have postpartum hypertension if your blood pressure is not being checked regularly. In most cases, postpartum hypertension will go away on its own, usually within a week of delivery. However, for some women, medical treatment is required to prevent serious complications, such as seizures or stroke. What are the causes? This condition may be caused by one or more of the following:  Hypertension that existed before pregnancy (chronic hypertension).  Hypertension that comes on as a result of pregnancy (gestational hypertension).  Hypertensive disorders during pregnancy (preeclampsia) or seizures in women who have high blood pressure during pregnancy (eclampsia).  A condition in which the liver, platelets, and red blood cells are damaged during pregnancy (HELLP syndrome).  A condition in which the thyroid produces too much hormones (hyperthyroidism).  Other rare problems of the nerves (neurological disorders) or blood disorders. In some cases, the cause may not be known. What increases the risk? The following factors may make you more likely to develop this condition:  Chronic hypertension. In some cases, this may not have been diagnosed before pregnancy.  Obesity.  Type 2 diabetes.  Kidney disease.  History of preeclampsia or eclampsia.  Other medical conditions that change the level of hormones in the body (hormonal imbalance). What are the signs or symptoms? As with all types of hypertension, postpartum hypertension may not have any symptoms. Depending on how high your blood pressure is, you may experience:  Headaches. These may be mild, moderate, or severe. They may also be steady, constant, or sudden in onset (thunderclap headache).  Changes in your ability to see (visual changes).  Dizziness.  Shortness of breath.  Swelling  of your hands, feet, lower legs, or face. In some cases, you may have swelling in more than one of these locations.  Heart palpitations or a racing heartbeat.  Difficulty breathing while lying down.  Decrease in the amount of urine that you pass. Other rare signs and symptoms may include:  Sweating more than usual. This lasts longer than a few days after delivery.  Chest pain.  Sudden dizziness when you get up from sitting or lying down.  Seizures.  Nausea or vomiting.  Abdominal pain. How is this diagnosed? This condition may be diagnosed based on the results of a physical exam, blood pressure measurements, and blood and urine tests. You may also have other tests, such as a CT scan or an MRI, to check for other problems of postpartum hypertension. How is this treated? If blood pressure is high enough to require treatment, your options may include:  Medicines to reduce blood pressure (antihypertensives). Tell your health care provider if you are breastfeeding or if you plan to breastfeed. There are many antihypertensive medicines that are safe to take while breastfeeding.  Stopping medicines that may be causing hypertension.  Treating medical conditions that are causing hypertension.  Treating the complications of hypertension, such as seizures, stroke, or kidney problems. Your health care provider will also continue to monitor your blood pressure closely until it is within a safe range for you. Follow these instructions at home:  Take over-the-counter and prescription medicines only as told by your health care provider.  Return to your normal activities as told by your health care provider. Ask your health care provider what activities are safe for you.  Do not use any products that contain nicotine or tobacco, such as cigarettes and e-cigarettes. If   you need help quitting, ask your health care provider.  Keep all follow-up visits as told by your health care provider. This  is important. Contact a health care provider if:  Your symptoms get worse.  You have new symptoms, such as: ? A headache that does not get better. ? Dizziness. ? Visual changes. Get help right away if:  You suddenly develop swelling in your hands, ankles, or face.  You have sudden, rapid weight gain.  You develop difficulty breathing, chest pain, racing heartbeat, or heart palpitations.  You develop severe pain in your abdomen.  You have any symptoms of a stroke. "BE FAST" is an easy way to remember the main warning signs of a stroke: ? B - Balance. Signs are dizziness, sudden trouble walking, or loss of balance. ? E - Eyes. Signs are trouble seeing or a sudden change in vision. ? F - Face. Signs are sudden weakness or numbness of the face, or the face or eyelid drooping on one side. ? A - Arms. Signs are weakness or numbness in an arm. This happens suddenly and usually on one side of the body. ? S - Speech. Signs are sudden trouble speaking, slurred speech, or trouble understanding what people say. ? T - Time. Time to call emergency services. Write down what time symptoms started.  You have other signs of a stroke, such as: ? A sudden, severe headache with no known cause. ? Nausea or vomiting. ? Seizure. These symptoms may represent a serious problem that is an emergency. Do not wait to see if the symptoms will go away. Get medical help right away. Call your local emergency services (911 in the [place] Do not drive yourself to the hospital. Summary  Postpartum hypertension is high blood pressure that remains higher than normal after childbirth.  In most cases, postpartum hypertension will go away on its own, usually within a week of delivery.  For some women, medical treatment is required to prevent serious complications, such as seizures or stroke. This information is not intended to replace advice given to you by your health care provider. Make sure you discuss any questions  you have with your health care provider. Document Released: 10/21/2013 Document Revised: 03/26/2018 Document Reviewed: 12/08/2016 Elsevier Patient Education  2020 [employer].

## 2019-01-24 ENCOUNTER — Encounter (HOSPITAL_COMMUNITY): Payer: Self-pay | Admitting: *Deleted

## 2019-11-29 ENCOUNTER — Other Ambulatory Visit: Payer: Self-pay | Admitting: *Deleted

## 2019-11-29 DIAGNOSIS — N644 Mastodynia: Secondary | ICD-10-CM

## 2019-12-20 ENCOUNTER — Other Ambulatory Visit: Payer: Self-pay

## 2020-01-10 ENCOUNTER — Other Ambulatory Visit: Payer: Self-pay

## 2020-02-10 ENCOUNTER — Other Ambulatory Visit: Payer: Self-pay

## 2020-02-10 ENCOUNTER — Ambulatory Visit
Admission: RE | Admit: 2020-02-10 | Discharge: 2020-02-10 | Disposition: A | Discharge status: Home / Self Care | Payer: Self-pay | Source: Ambulatory Visit | Attending: *Deleted | Admitting: *Deleted

## 2020-02-10 ENCOUNTER — Other Ambulatory Visit: Payer: Self-pay | Admitting: *Deleted

## 2020-02-10 DIAGNOSIS — N644 Mastodynia: Secondary | ICD-10-CM

## 2020-02-10 DIAGNOSIS — N6320 Unspecified lump in the left breast, unspecified quadrant: Secondary | ICD-10-CM

## 2020-02-17 ENCOUNTER — Ambulatory Visit
Admission: RE | Admit: 2020-02-17 | Discharge: 2020-02-17 | Disposition: A | Discharge status: Home / Self Care | Payer: 59 | Source: Ambulatory Visit | Attending: *Deleted | Admitting: *Deleted

## 2020-02-17 ENCOUNTER — Other Ambulatory Visit: Payer: Self-pay

## 2020-02-17 DIAGNOSIS — N6320 Unspecified lump in the left breast, unspecified quadrant: Secondary | ICD-10-CM

## 2021-01-28 IMAGING — US US BREAST*L* LIMITED INC AXILLA
1 series · 12 of 12 positions shown · non-contrast
Comparison: None.

CLINICAL DATA: 34-year-old female with intermittent, slightly
diffuse pain in the outer left breast for 6 months.

EXAM:
DIGITAL DIAGNOSTIC BILATERAL MAMMOGRAM WITH CAD AND TOMO
ULTRASOUND LEFT BREAST

[Series 1: us breast*left* limited inc axilla · 0.06mm/px · 12 of 12 slices shown]
[im 1/12]
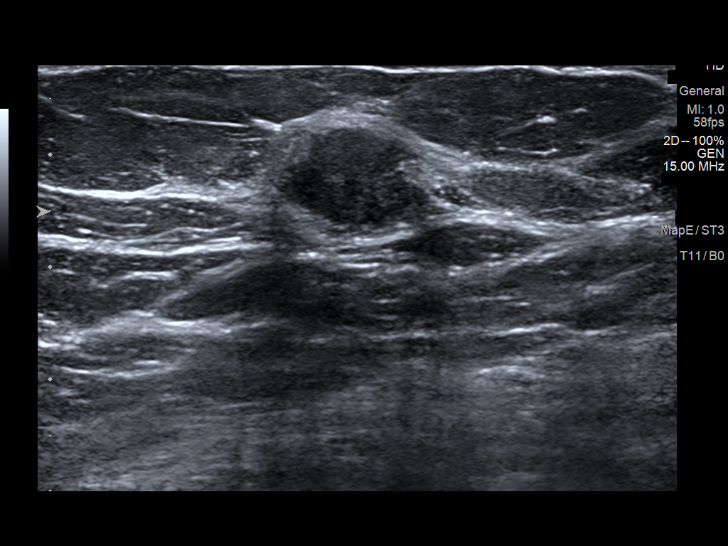
[im 2/12]
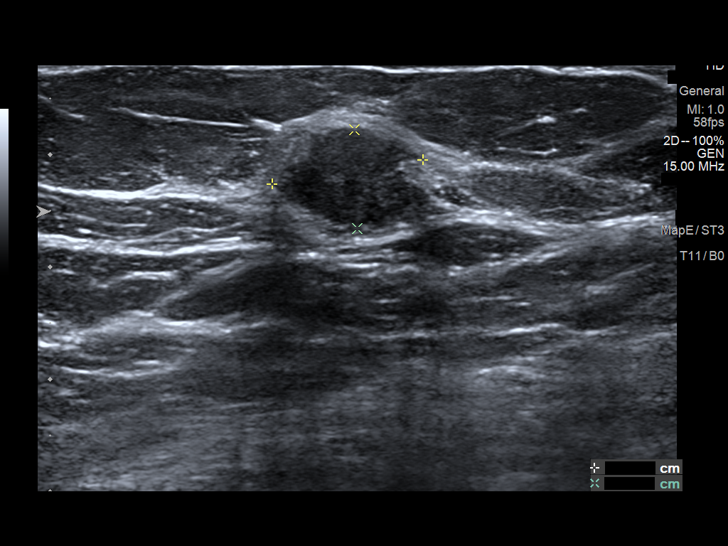
[im 3/12]
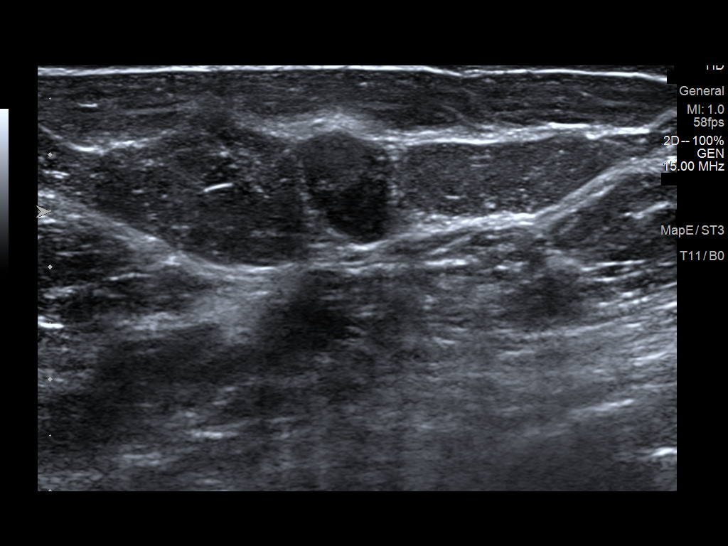
[im 4/12]
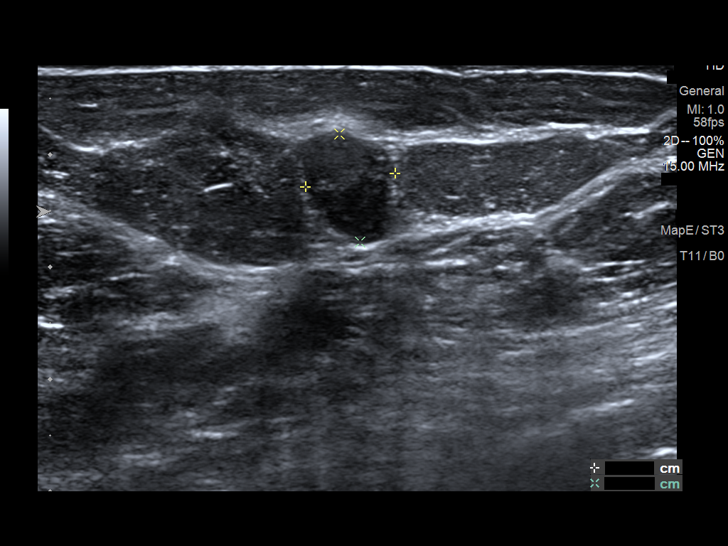
[im 5/12]
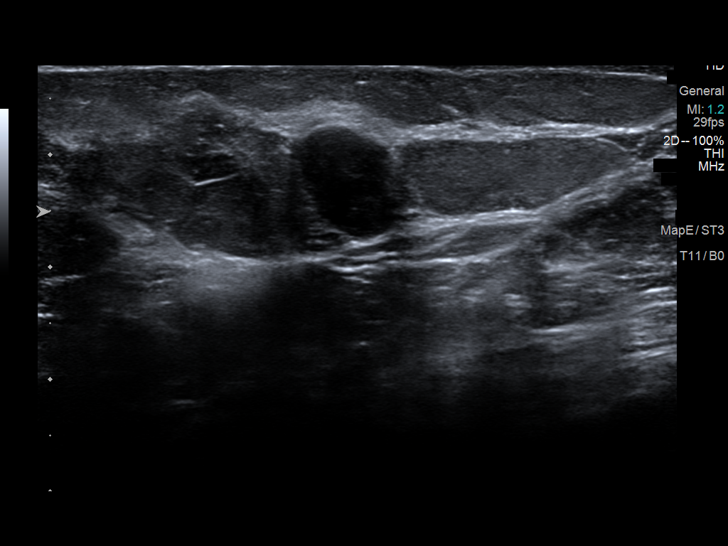
[im 6/12]
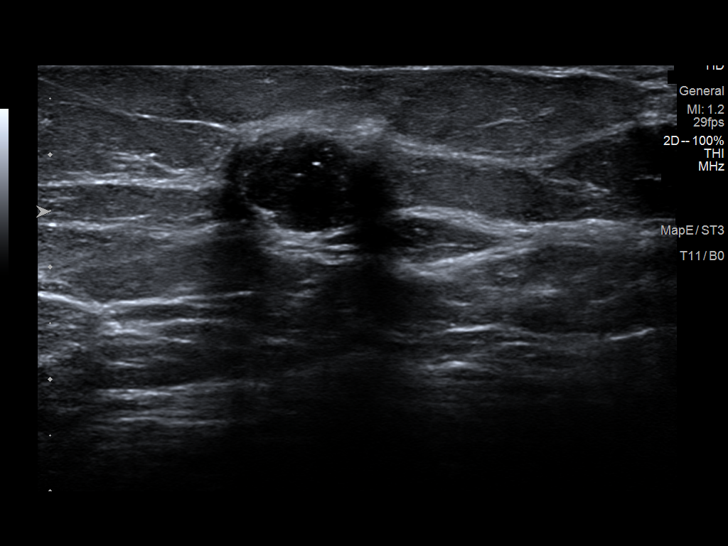
[im 7/12]
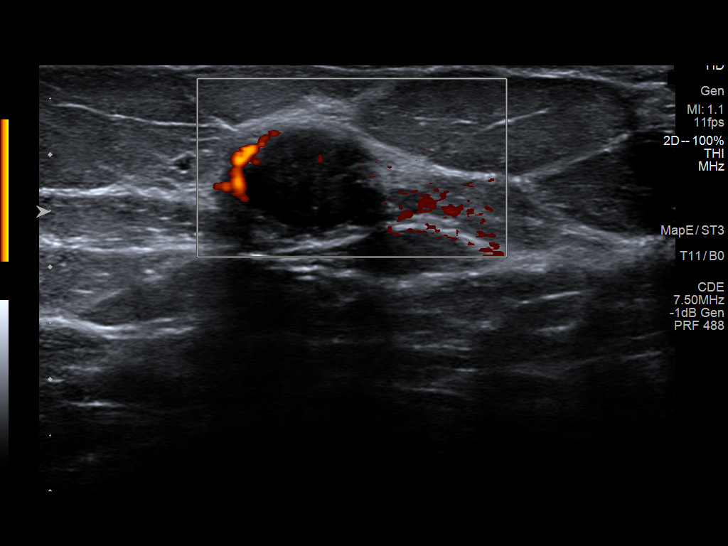
[im 8/12]
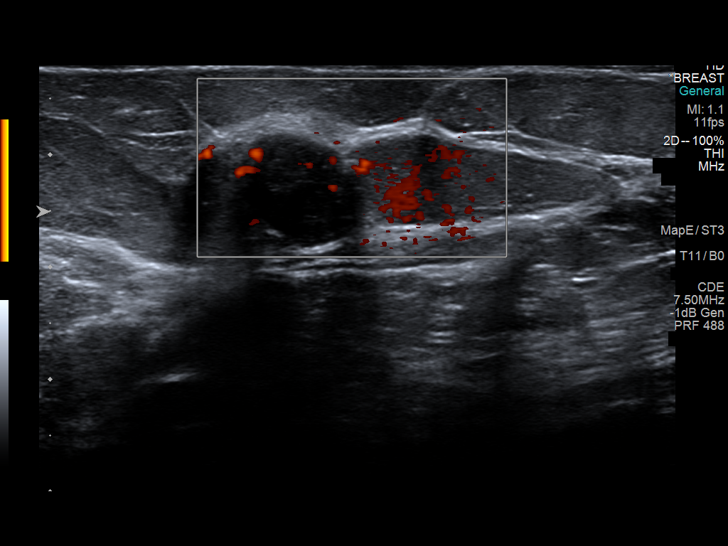
[im 9/12]
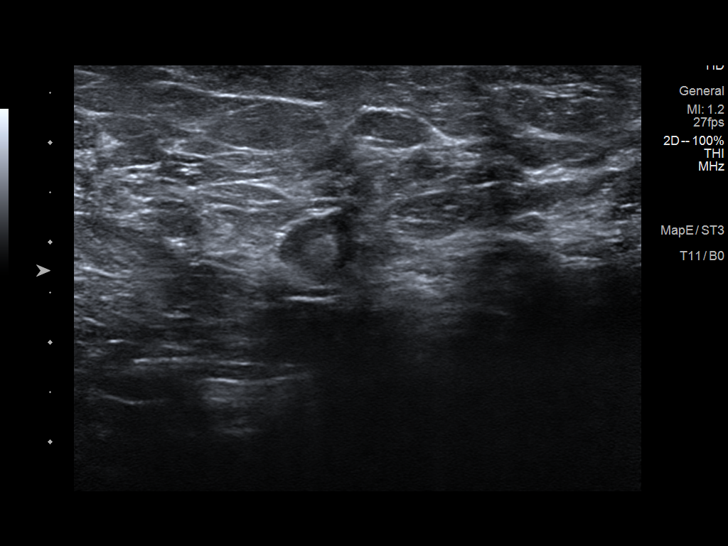
[im 10/12]
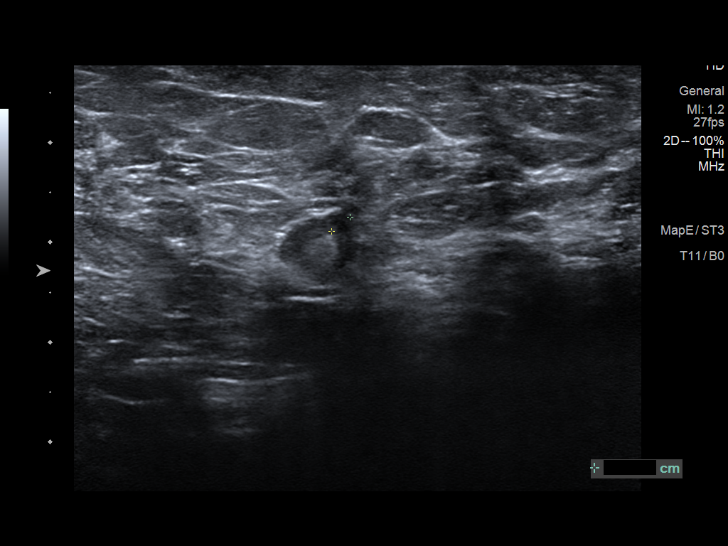
[im 11/12]
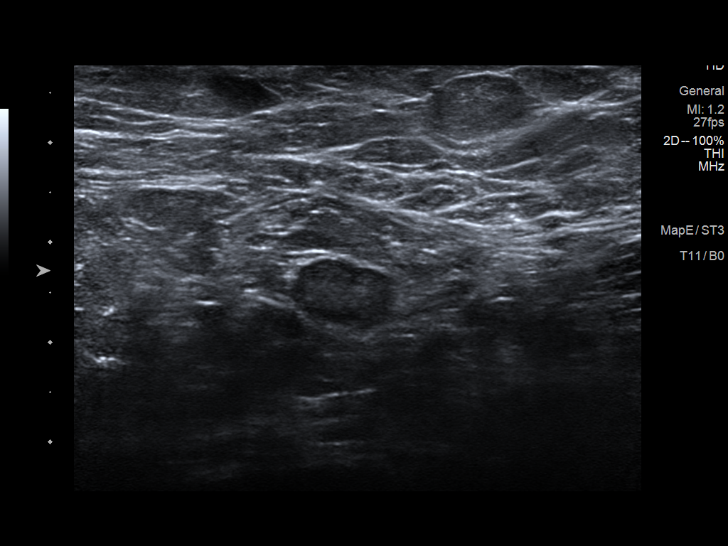
[im 12/12]
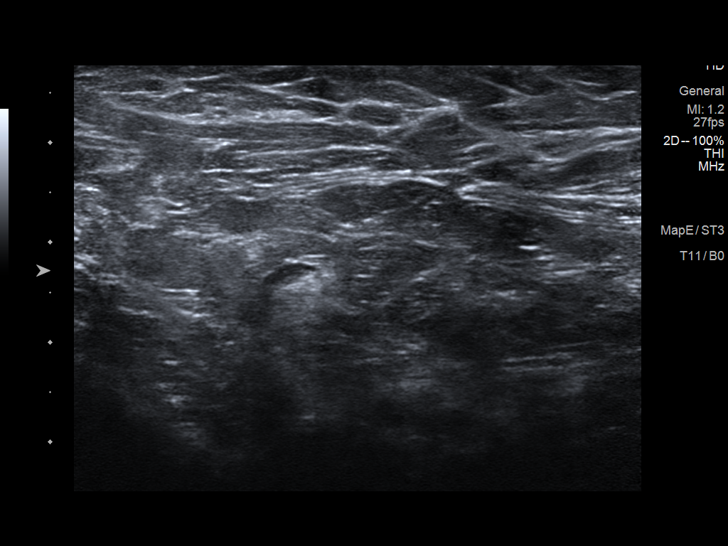

[12 of 12 positions shown; findings below may reference images not displayed]

ACR Breast Density Category b: There are scattered areas of
fibroglandular density.
FINDINGS: An oval, circumscribed equal density mass is demonstrated in the
upper outer left breast at middle depth. No additional suspicious
findings are identified in the remainder of either breast, including
the lower outer quadrant on the left.

Mammographic images were processed with CAD.

Targeted ultrasound is performed, showing an oval, irregular
hypoechoic mass with associated vascularity at the 2 o'clock
position 7 cm from the nipple. It measures 1.4 x 1.0 x 0.8 cm. This
correlates well with the mammographic finding. The patient states
this is the general area of her pain. No additional suspicious
sonographic findings are identified.

Evaluation of the left axilla demonstrates no suspicious
lymphadenopathy.
IMPRESSION: 1. Indeterminate left breast mass at the 2 o'clock position
corresponding with the region of the patient's pain. Recommendation
is for ultrasound-guided biopsy.
2. No suspicious left axillary lymphadenopathy.
3. No mammographic evidence of malignancy on the right.

RECOMMENDATION:
Ultrasound-guided biopsy of the left breast.

I have discussed the findings and recommendations with the patient.
If applicable, a reminder letter will be sent to the patient
regarding the next appointment.

BI-RADS CATEGORY  4: Suspicious.

## 2021-02-04 IMAGING — US US BREAST BX W LOC DEV 1ST LESION IMG BX SPEC US GUIDE*L*
1 series · 11 of 11 positions shown · non-contrast
Comparison: Previous exam(s).
COMPARISON: Previous exam(s).

Addendum:
CLINICAL DATA: 34-year-old female presenting for biopsy of a left
breast mass.

EXAM:
ULTRASOUND GUIDED LEFT BREAST CORE NEEDLE BIOPSY

[Series 1: us breast bx w loc dev 1st lesion img bx spec us g · 0.07mm/px · 11 of 11 slices shown]
[im 1/11]
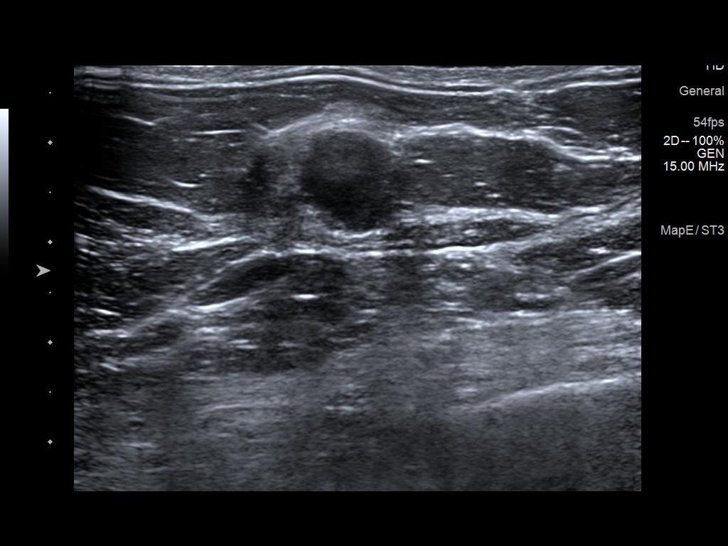
[im 2/11]
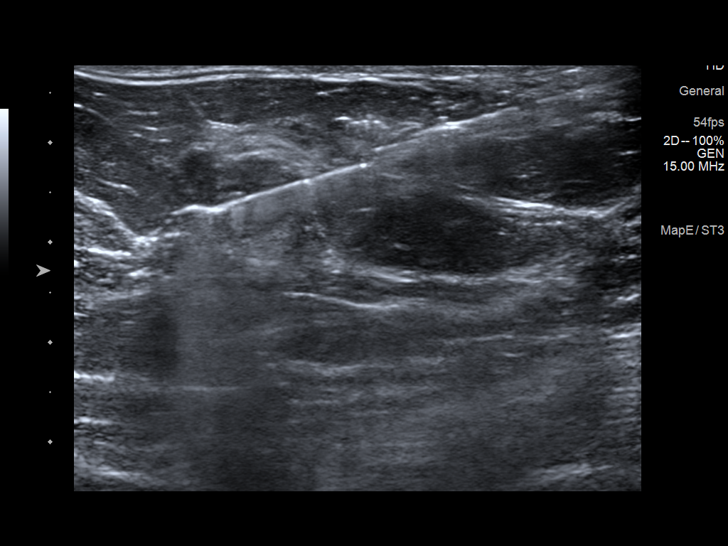
[im 3/11]
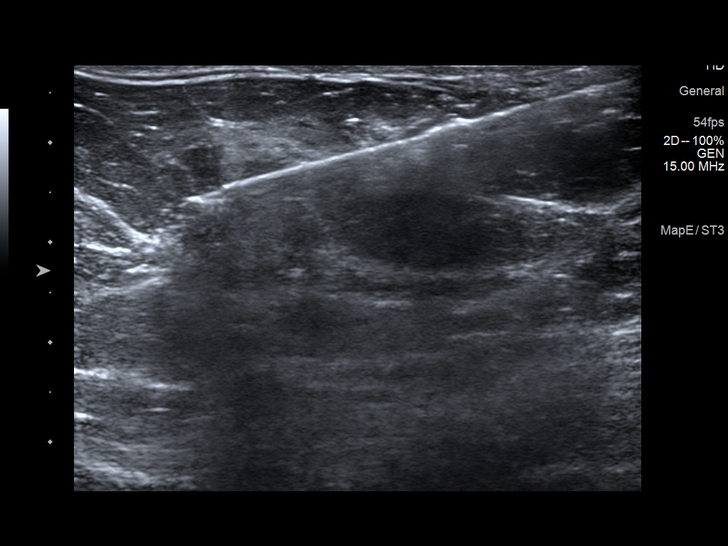
[im 4/11]
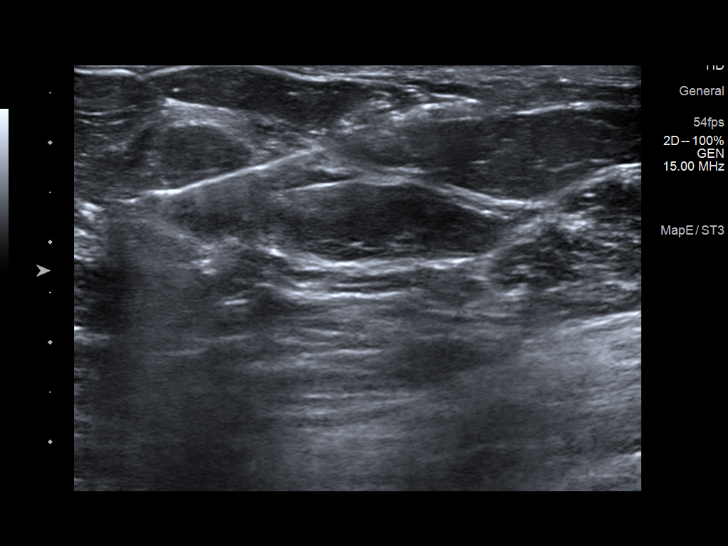
[im 5/11]
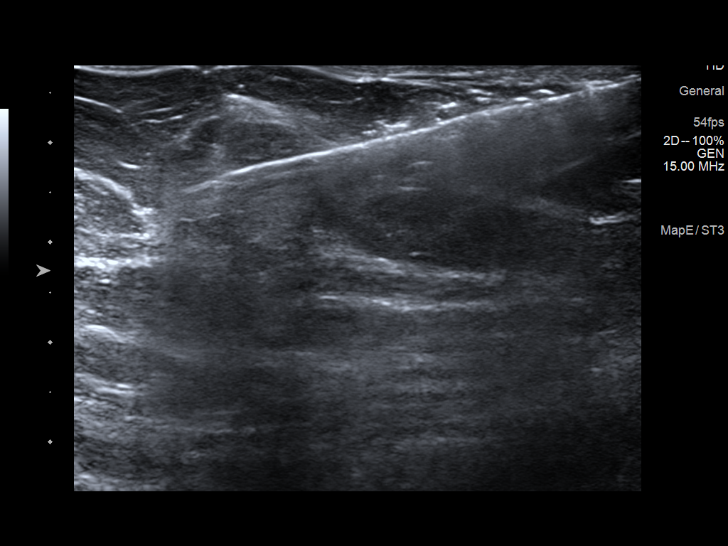
[im 6/11]
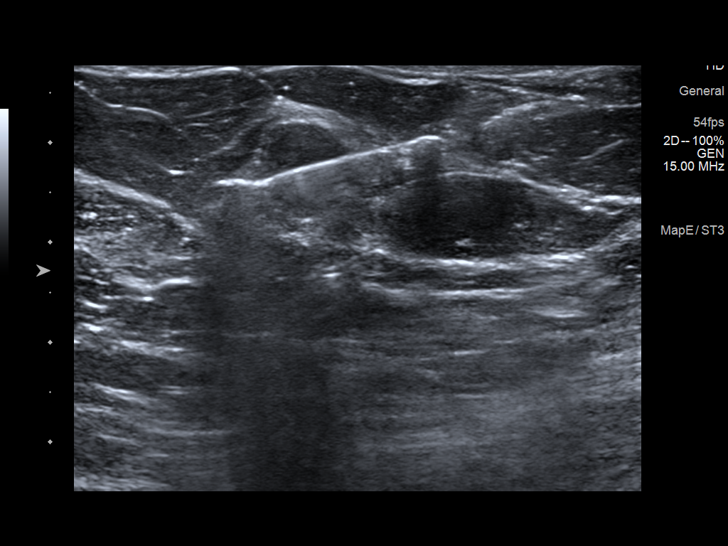
[im 7/11]
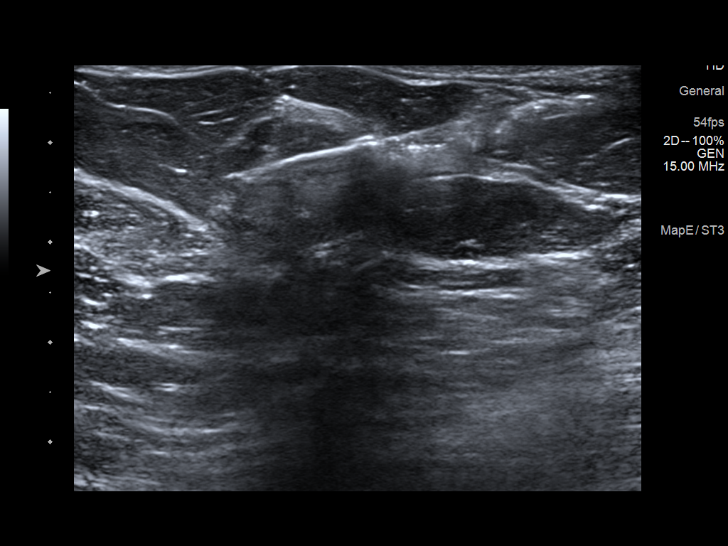
[im 8/11]
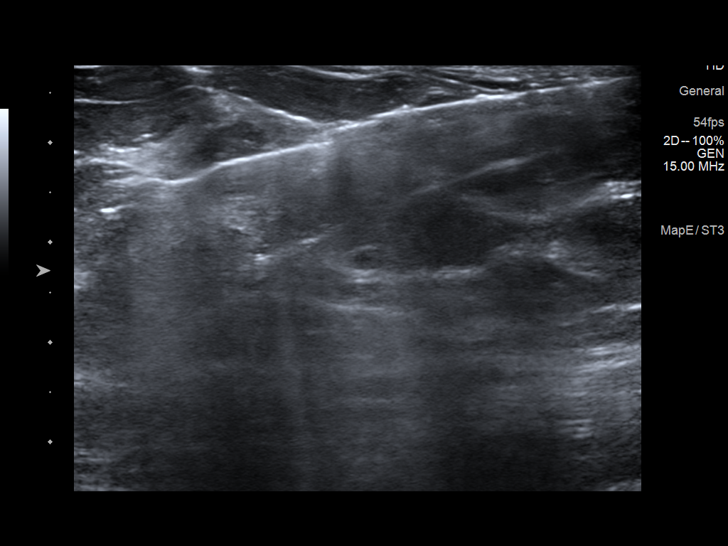
[im 9/11]
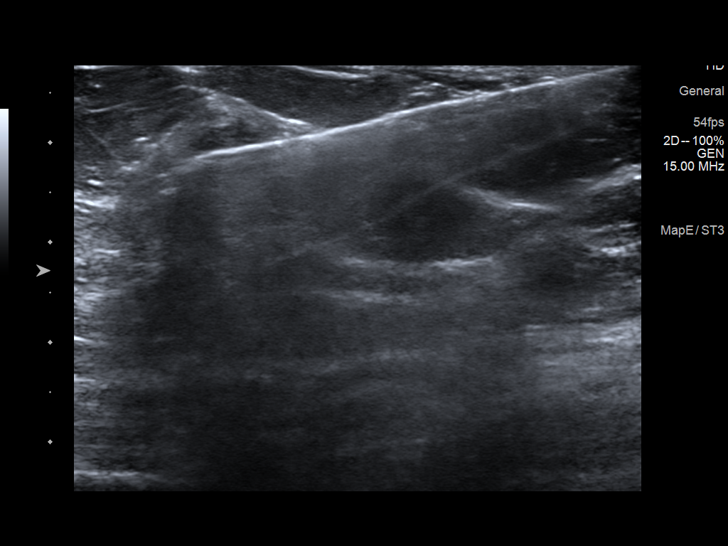
[im 10/11]
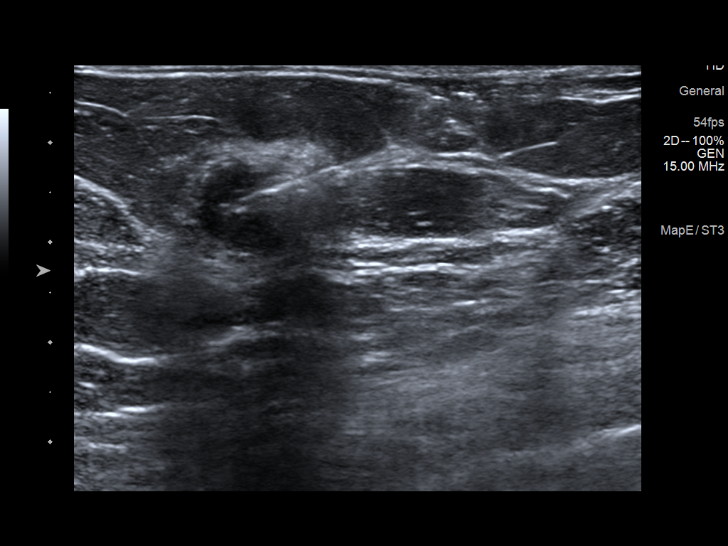
[im 11/11]
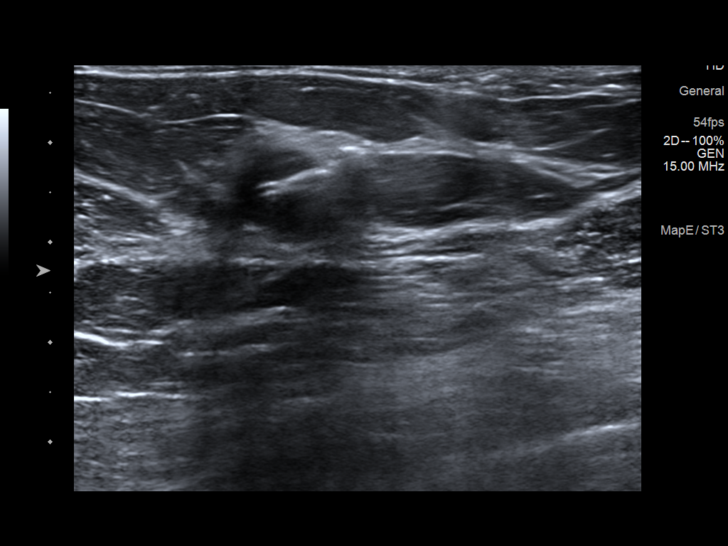

[11 of 11 positions shown; findings below may reference images not displayed]



Lesion quadrant: Upper outer quadrant

Using sterile technique and 1% Lidocaine as local anesthetic, under
direct ultrasound visualization, a 14 gauge Deocampo device was
used to perform biopsy of left breast mass at 2 o'clock using a
lateral approach. At the conclusion of the procedure a ribbon tissue
marker clip was deployed into the biopsy cavity. Follow up 2 view
mammogram was performed and dictated separately.
IMPRESSION: Ultrasound guided biopsy of a left breast mass at 2 o'clock. No
apparent complications.

ADDENDUM:
Pathology revealed FIBROADENOMA of the Left breast, 2:00 o'clock.
This was found to be concordant by Dr. Lenin Wences.

Pathology results were discussed with the patient by telephone by
Ponthus Porath, RN Nurse Navigator. The patient reported doing well
after the biopsy with tenderness at the site. Post biopsy
instructions and care were reviewed and questions were answered. The
patient was encouraged to call [REDACTED] for any additional concerns.

The patient was instructed to continue with monthly self breast
examinations, clinical follow-up as needed, and to return for annual
mammography at 41.

Pathology results reported by Sthefany Pardo, RN on 02/21/2020.



Lesion quadrant: Upper outer quadrant

Using sterile technique and 1% Lidocaine as local anesthetic, under
direct ultrasound visualization, a 14 gauge Deocampo device was
used to perform biopsy of left breast mass at 2 o'clock using a
lateral approach. At the conclusion of the procedure a ribbon tissue
marker clip was deployed into the biopsy cavity. Follow up 2 view
mammogram was performed and dictated separately.
IMPRESSION: Ultrasound guided biopsy of a left breast mass at 2 o'clock. No
apparent complications.

## 2022-04-02 ENCOUNTER — Emergency Department (HOSPITAL_COMMUNITY): Payer: 59

## 2022-04-02 ENCOUNTER — Other Ambulatory Visit: Payer: Self-pay

## 2022-04-02 ENCOUNTER — Encounter (HOSPITAL_COMMUNITY): Payer: Self-pay | Admitting: Family Medicine

## 2022-04-02 ENCOUNTER — Inpatient Hospital Stay (HOSPITAL_COMMUNITY)
Admission: EM | Admit: 2022-04-02 | Discharge: 2022-04-08 | DRG: 291 | Disposition: A | Discharge status: Home / Self Care | Payer: 59 | Source: Ambulatory Visit | Attending: Internal Medicine | Admitting: Internal Medicine

## 2022-04-02 DIAGNOSIS — N184 Chronic kidney disease, stage 4 (severe): Secondary | ICD-10-CM | POA: Diagnosis present

## 2022-04-02 DIAGNOSIS — E669 Obesity, unspecified: Secondary | ICD-10-CM | POA: Diagnosis present

## 2022-04-02 DIAGNOSIS — I160 Hypertensive urgency: Secondary | ICD-10-CM | POA: Diagnosis not present

## 2022-04-02 DIAGNOSIS — Z79899 Other long term (current) drug therapy: Secondary | ICD-10-CM

## 2022-04-02 DIAGNOSIS — J90 Pleural effusion, not elsewhere classified: Secondary | ICD-10-CM | POA: Diagnosis not present

## 2022-04-02 DIAGNOSIS — Z6839 Body mass index (BMI) 39.0-39.9, adult: Secondary | ICD-10-CM

## 2022-04-02 DIAGNOSIS — N179 Acute kidney failure, unspecified: Secondary | ICD-10-CM | POA: Diagnosis present

## 2022-04-02 DIAGNOSIS — D509 Iron deficiency anemia, unspecified: Secondary | ICD-10-CM | POA: Diagnosis present

## 2022-04-02 DIAGNOSIS — Z818 Family history of other mental and behavioral disorders: Secondary | ICD-10-CM

## 2022-04-02 DIAGNOSIS — I429 Cardiomyopathy, unspecified: Secondary | ICD-10-CM | POA: Diagnosis present

## 2022-04-02 DIAGNOSIS — I2489 Other forms of acute ischemic heart disease: Secondary | ICD-10-CM | POA: Diagnosis present

## 2022-04-02 DIAGNOSIS — E876 Hypokalemia: Secondary | ICD-10-CM | POA: Diagnosis present

## 2022-04-02 DIAGNOSIS — E785 Hyperlipidemia, unspecified: Secondary | ICD-10-CM | POA: Diagnosis present

## 2022-04-02 DIAGNOSIS — I3139 Other pericardial effusion (noninflammatory): Secondary | ICD-10-CM | POA: Diagnosis present

## 2022-04-02 DIAGNOSIS — I5021 Acute systolic (congestive) heart failure: Secondary | ICD-10-CM | POA: Diagnosis not present

## 2022-04-02 DIAGNOSIS — R7989 Other specified abnormal findings of blood chemistry: Secondary | ICD-10-CM | POA: Diagnosis present

## 2022-04-02 DIAGNOSIS — Z823 Family history of stroke: Secondary | ICD-10-CM

## 2022-04-02 DIAGNOSIS — I161 Hypertensive emergency: Secondary | ICD-10-CM | POA: Diagnosis present

## 2022-04-02 DIAGNOSIS — I5023 Acute on chronic systolic (congestive) heart failure: Secondary | ICD-10-CM | POA: Diagnosis present

## 2022-04-02 DIAGNOSIS — I130 Hypertensive heart and chronic kidney disease with heart failure and stage 1 through stage 4 chronic kidney disease, or unspecified chronic kidney disease: Secondary | ICD-10-CM | POA: Diagnosis present

## 2022-04-02 DIAGNOSIS — R0602 Shortness of breath: Secondary | ICD-10-CM | POA: Diagnosis present

## 2022-04-02 LAB — PREGNANCY, URINE: Preg Test, Ur: NEGATIVE

## 2022-04-02 LAB — URINALYSIS, ROUTINE W REFLEX MICROSCOPIC
Bilirubin Urine: NEGATIVE
Glucose, UA: 50 mg/dL — AB
Ketones, ur: 5 mg/dL — AB
Leukocytes,Ua: NEGATIVE
Nitrite: NEGATIVE
Protein, ur: >=300 mg/dL — AB
Specific Gravity, Urine: 1.013 (ref 1.005–1.030)
pH: 5 (ref 5.0–8.0)

## 2022-04-02 LAB — RETICULOCYTES
Immature Retic Fract: 33.7 % — ABNORMAL HIGH (ref 2.3–15.9)
RBC.: 5.72 MIL/uL — ABNORMAL HIGH (ref 3.87–5.11)
Retic Count, Absolute: 152.7 10*3/uL (ref 19.0–186.0)
Retic Ct Pct: 2.7 % (ref 0.4–3.1)

## 2022-04-02 LAB — CBC
HCT: 31.1 % — ABNORMAL LOW (ref 36.0–46.0)
Hemoglobin: 9.8 g/dL — ABNORMAL LOW (ref 12.0–15.0)
MCH: 22.3 pg — ABNORMAL LOW (ref 26.0–34.0)
MCHC: 31.5 g/dL (ref 30.0–36.0)
MCV: 70.7 fL — ABNORMAL LOW (ref 80.0–100.0)
Platelets: 235 10*3/uL (ref 150–400)
RBC: 4.4 MIL/uL (ref 3.87–5.11)
RDW: 25 % — ABNORMAL HIGH (ref 11.5–15.5)
WBC: 9.2 10*3/uL (ref 4.0–10.5)
nRBC: 0.3 % — ABNORMAL HIGH (ref 0.0–0.2)

## 2022-04-02 LAB — HEPATIC FUNCTION PANEL
ALT: 17 U/L (ref 0–44)
AST: 19 U/L (ref 15–41)
Albumin: 2.9 g/dL — ABNORMAL LOW (ref 3.5–5.0)
Alkaline Phosphatase: 65 U/L (ref 38–126)
Bilirubin, Direct: 0.2 mg/dL (ref 0.0–0.2)
Indirect Bilirubin: 1 mg/dL — ABNORMAL HIGH (ref 0.3–0.9)
Total Bilirubin: 1.2 mg/dL (ref 0.3–1.2)
Total Protein: 6.6 g/dL (ref 6.5–8.1)

## 2022-04-02 LAB — I-STAT BETA HCG BLOOD, ED (MC, WL, AP ONLY): I-stat hCG, quantitative: <5 m[IU]/mL (ref <5)

## 2022-04-02 LAB — BASIC METABOLIC PANEL
Anion gap: 11 (ref 5–15)
BUN: 24 mg/dL — ABNORMAL HIGH (ref 6–20)
CO2: 25 mmol/L (ref 22–32)
Calcium: 8.5 mg/dL — ABNORMAL LOW (ref 8.9–10.3)
Chloride: 102 mmol/L (ref 98–111)
Creatinine, Ser: 2.62 mg/dL — ABNORMAL HIGH (ref 0.44–1.00)
GFR, Estimated: 24 mL/min — ABNORMAL LOW (ref >60)
Glucose, Bld: 87 mg/dL (ref 70–99)
Potassium: 2.6 mmol/L — CL (ref 3.5–5.1)
Sodium: 138 mmol/L (ref 135–145)

## 2022-04-02 LAB — TROPONIN I (HIGH SENSITIVITY): Troponin I (High Sensitivity): 87 ng/L — ABNORMAL HIGH (ref <18)

## 2022-04-02 LAB — IRON AND TIBC
Iron: 47 ug/dL (ref 28–170)
Saturation Ratios: 12 % (ref 10.4–31.8)
TIBC: 388 ug/dL (ref 250–450)
UIBC: 341 ug/dL

## 2022-04-02 LAB — FERRITIN: Ferritin: 20 ng/mL (ref 11–307)

## 2022-04-02 LAB — VITAMIN B12: Vitamin B-12: 1387 pg/mL — ABNORMAL HIGH (ref 180–914)

## 2022-04-02 LAB — BRAIN NATRIURETIC PEPTIDE: B Natriuretic Peptide: 2272.6 pg/mL — ABNORMAL HIGH (ref 0.0–100.0)

## 2022-04-02 LAB — MAGNESIUM: Magnesium: 1.6 mg/dL — ABNORMAL LOW (ref 1.7–2.4)

## 2022-04-02 MED ORDER — MAGNESIUM SULFATE 2 GM/50ML IV SOLN
2.0000 g | Freq: Once | INTRAVENOUS | Status: AC
  Administered 2022-04-02: 2 g via INTRAVENOUS
  Filled 2022-04-02: qty 50

## 2022-04-02 MED ORDER — POTASSIUM CHLORIDE CRYS ER 20 MEQ PO TBCR
40.0000 meq | EXTENDED_RELEASE_TABLET | Freq: Once | ORAL | Status: AC
  Administered 2022-04-02: 40 meq via ORAL
  Filled 2022-04-02: qty 2

## 2022-04-02 MED ORDER — ZOLPIDEM TARTRATE 5 MG PO TABS
5.0000 mg | ORAL_TABLET | Freq: Every evening | ORAL | Status: DC | PRN
  Administered 2022-04-03 – 2022-04-04 (×2): 5 mg via ORAL
  Filled 2022-04-02 (×2): qty 1

## 2022-04-02 MED ORDER — HYDRALAZINE HCL 50 MG PO TABS
50.0000 mg | ORAL_TABLET | Freq: Four times a day (QID) | ORAL | Status: DC | PRN
  Administered 2022-04-02: 50 mg via ORAL
  Filled 2022-04-02: qty 2

## 2022-04-02 MED ORDER — FUROSEMIDE 10 MG/ML IJ SOLN: 20.0000 mg | Freq: Once | INTRAMUSCULAR | Status: DC

## 2022-04-02 MED ORDER — POTASSIUM CHLORIDE 10 MEQ/100ML IV SOLN
10.0000 meq | INTRAVENOUS | Status: AC
  Administered 2022-04-02 – 2022-04-03 (×2): 10 meq via INTRAVENOUS
  Filled 2022-04-02 (×2): qty 100

## 2022-04-02 MED ORDER — NITROGLYCERIN IN D5W 200-5 MCG/ML-% IV SOLN
0.0000 ug/min | INTRAVENOUS | Status: DC
  Administered 2022-04-03: 80 ug/min via INTRAVENOUS
  Administered 2022-04-03: 5 ug/min via INTRAVENOUS
  Filled 2022-04-02 (×2): qty 250

## 2022-04-02 MED ORDER — FUROSEMIDE 10 MG/ML IJ SOLN
40.0000 mg | Freq: Once | INTRAMUSCULAR | Status: AC
  Administered 2022-04-02: 40 mg via INTRAVENOUS
  Filled 2022-04-02: qty 4

## 2022-04-02 MED ORDER — FUROSEMIDE 10 MG/ML IJ SOLN
40.0000 mg | Freq: Two times a day (BID) | INTRAMUSCULAR | Status: DC
  Administered 2022-04-03 – 2022-04-05 (×5): 40 mg via INTRAVENOUS
  Filled 2022-04-02 (×5): qty 4

## 2022-04-02 MED ORDER — HEPARIN SODIUM (PORCINE) 5000 UNIT/ML IJ SOLN
5000.0000 [IU] | Freq: Three times a day (TID) | INTRAMUSCULAR | Status: DC
  Administered 2022-04-02 – 2022-04-08 (×17): 5000 [IU] via SUBCUTANEOUS
  Filled 2022-04-02 (×16): qty 1

## 2022-04-02 NOTE — ED Triage Notes (Signed)
Pt reports having the flu in December, being diagnosed with bronchitis at the beginning of January, and having worsening sob, along with heart racing, since Monday. Patient went to UC for same today and was told she had an enlarged heart and to proceed to the ED. Endorses sharp upper back pain.

## 2022-04-02 NOTE — ED Provider Notes (Signed)
[facility] Provider Note  CSN: [id number] Arrival date & time: 04/02/22 1511  Chief Complaint(s) Shortness of Breath  HPI Maria Osborn is a 37 y.o. female without significant past medical history presenting to the emergency department with shortness of breath.  She reports that in the past week she has had worsening shortness of breath.  Reports today she noticed leg swelling.  She reports chest heaviness over this period as well.  She reports that earlier this month she had flu and fevers and chills and then developed some persistent mild cough and shortness of breath.  No nausea, vomiting.  No drug or alcohol use.  No similar episodes previously.  No family history of heart problems.  Had a chest x-ray which showed enlarged cardiac silhouette and was sent to the emergency department out of concern for CHF.   Past Medical History Past Medical History:  Diagnosis Date   Anemia    Fibroid    Fibroids    Headache    Patient Active Problem List   Diagnosis Date Noted   Acute systolic CHF (congestive heart failure) ([place]) 04/02/2022   Hypokalemia 04/02/2022   Gestational thrombocytopenia ([place]) 01/21/2019   Pregnancy with history of cesarean section, antepartum 01/21/2019   Postpartum care following cesarean delivery (11/20) 01/21/2019   Status post repeat low transverse cesarean section 01/21/2019   Delivery of pregnancy by cesarean section 01/21/2019   S/P cesarean section 12/27/2017   Chronic hypertension 12/25/2017   S/P laparotomy 06/10/2016   Somnolence, daytime 04/09/2016   Chronic migraine w/o aura w/o status migrainosus, not intractable 02/05/2015   Home Medication(s) Prior to Admission medications   Medication Sig Start Date End Date Taking? Authorizing Provider  albuterol (VENTOLIN HFA) 108 (90 Base) MCG/ACT inhaler Inhale 1 puff into the lungs every 6 (six) hours as needed for wheezing or shortness of breath.   Yes  [provider]  fexofenadine (ALLEGRA) 180 MG tablet Take 180 mg by mouth daily as needed for allergies or rhinitis.   Yes [provider]  fluticasone (FLONASE) 50 MCG/ACT nasal spray Place 1 spray into both nostrils daily as needed for allergies or rhinitis.   Yes [provider]  HYDROcodone bit-homatropine (HYDROMET) 5-1.5 MG/5ML syrup Take 5 mLs by mouth every 6 (six) hours as needed for cough.   Yes [provider]  promethazine (PHENERGAN) 25 MG tablet Take 25 mg by mouth every 6 (six) hours as needed for nausea or vomiting.   Yes [provider]  acetaminophen (TYLENOL) 500 MG tablet Take 2 tablets (1,000 mg total) by mouth every 6 (six) hours. Patient not taking: Reported on 04/02/2022 01/23/19   Azucena Fallen, MD  labetalol (NORMODYNE) 100 MG tablet Take 1 tablet (100 mg total) by mouth 3 (three) times daily. Patient not taking: Reported on 04/02/2022 01/23/19   Azucena Fallen, MD  Past Surgical History Past Surgical History:  Procedure Laterality Date   CESAREAN SECTION N/A 12/26/2017   Procedure: CESAREAN SECTION;  Surgeon: Linda Hedges, DO;  Location: [facility];  Service: Obstetrics;  Laterality: N/A;   CESAREAN SECTION N/A 01/21/2019   Procedure: CESAREAN SECTION;  Surgeon: Azucena Fallen, MD;  Location: [facility];  Service: Obstetrics;  Laterality: N/A;   DILATION AND CURETTAGE OF UTERUS     HYSTEROSCOPY WITH D & C N/A 06/10/2016   Procedure: DILATATION & CURETTAGE/HYSTEROSCOPY;  Surgeon: Linda Hedges, DO;  Location: [facility];  Service: Gynecology;  Laterality: N/A;   MYOMECTOMY N/A 06/10/2016   Procedure: MYOMECTOMY, abdominal;  Surgeon: Linda Hedges, DO;  Location: [facility];  Service: Gynecology;  Laterality: N/A;   Family History Family History  Problem Relation Age of Onset   Migraines Father     Stroke Mother    Depression Mother    Lupus Maternal Aunt    Stroke Maternal Uncle     Social History Social History   Tobacco Use   Smoking status: Never   Smokeless tobacco: Never  Substance Use Topics   Alcohol use: Yes    Alcohol/week: 0.0 standard drinks of alcohol    Comment: Socially   Drug use: No   Allergies Amoxicillin  Review of Systems Review of Systems  All other systems reviewed and are negative.   Physical Exam Vital Signs  I have reviewed the triage vital signs BP (!) 186/128   Pulse (!) 103   Temp 97.8 F (36.6 C) (Oral)   Resp (!) 30   Ht '5\' 5"'$  (1.651 m)   Wt 107 kg   LMP 03/09/2022   SpO2 99%   BMI 39.27 kg/m  Physical Exam Vitals and nursing note reviewed.  Constitutional:      General: She is not in acute distress.    Appearance: She is well-developed.  HENT:     Head: Normocephalic and atraumatic.     Mouth/Throat:     Mouth: Mucous membranes are moist.  Eyes:     Pupils: Pupils are equal, round, and reactive to light.  Cardiovascular:     Rate and Rhythm: Normal rate and regular rhythm.     Heart sounds: No murmur heard. Pulmonary:     Comments: Tachypnea, increased work of breathing, diminished bibasilar breath sounds Abdominal:     General: Abdomen is flat.     Palpations: Abdomen is soft.     Tenderness: There is no abdominal tenderness.  Musculoskeletal:        General: No tenderness.     Right lower leg: Edema present.     Left lower leg: Edema present.  Skin:    General: Skin is warm and dry.  Neurological:     General: No focal deficit present.     Mental Status: She is alert. Mental status is at baseline.  Psychiatric:        Mood and Affect: Mood normal.        Behavior: Behavior normal.     ED Results and Treatments Labs (all labs ordered are listed, but only abnormal results are displayed) Labs Reviewed  BASIC METABOLIC PANEL - Abnormal; Notable for the following components:      Result Value    Potassium 2.6 (*)    BUN 24 (*)    Creatinine, Ser 2.62 (*)    Calcium 8.5 (*)    GFR, Estimated 24 (*)    All other components within normal limits  CBC - Abnormal; Notable for the following components:   Hemoglobin 9.8 (*)    HCT 31.1 (*)    MCV 70.7 (*)    MCH 22.3 (*)    RDW 25.0 (*)    nRBC 0.3 (*)    All other components within normal limits  BRAIN NATRIURETIC PEPTIDE - Abnormal; Notable for the following components:   B Natriuretic Peptide 2,272.6 (*)    All other components within normal limits  URINALYSIS, ROUTINE W REFLEX MICROSCOPIC - Abnormal; Notable for the following components:   APPearance CLOUDY (*)    Glucose, UA 50 (*)    Hgb urine dipstick LARGE (*)    Ketones, ur 5 (*)    Protein, ur >=300 (*)    Bacteria, UA FEW (*)    All other components within normal limits  TROPONIN I (HIGH SENSITIVITY) - Abnormal; Notable for the following components:   Troponin I (High Sensitivity) 87 (*)    All other components within normal limits  PREGNANCY, URINE  MAGNESIUM  HEPATIC FUNCTION PANEL  I-STAT BETA HCG BLOOD, ED ([facility], [facility], [facility] ONLY)  TROPONIN I (HIGH SENSITIVITY)                                                                                                                          Radiology DG Chest 2 View  Result Date: 04/02/2022 CLINICAL DATA:  Shortness of breath, cough, and chest pain. EXAM: CHEST - 2 VIEW COMPARISON:  None Available. FINDINGS: The cardiac silhouette is mildly to moderately enlarged. The lungs are hypoinflated. There is moderate airway thickening. No focal airspace consolidation, overt pulmonary edema, sizable pleural effusion, or pneumothorax is identified. No acute osseous abnormality is seen. IMPRESSION: 1. Airway thickening which may reflect bronchitis. 2. Cardiomegaly. Electronically Signed   By: Logan Bores M.D.   On: 04/02/2022 16:18    Pertinent labs & imaging results that were available during my care of the patient were reviewed by me  and considered in my medical decision making (see MDM for details).  Medications Ordered in ED Medications  furosemide (LASIX) injection 20 mg (has no administration in time range)  magnesium sulfate IVPB 2 g 50 mL (has no administration in time range)  potassium chloride SA (KLOR-CON M) CR tablet 40 mEq (40 mEq Oral Given 04/02/22 2032)  Procedures Ultrasound ED Echo  Date/Time: 04/02/2022 7:13 PM  Performed by: Cristie Hem, MD Authorized by: Cristie Hem, MD   Procedure details:    Indications: dyspnea     Views: parasternal long axis view and parasternal short axis view     Images: archived   Findings:    Pericardium: small pericardial effusion     Cardiac Activity: normal cardiac activity     LV Function: severly depressed (<30%)     RV Diameter: normal   Impression:    Impression: pericardial effusion present, abnormal cardiac activity and decreased contractility   .Critical Care  Performed by: Cristie Hem, MD Authorized by: Cristie Hem, MD   Critical care provider statement:    Critical care time (minutes):  30   Critical care was necessary to treat or prevent imminent or life-threatening deterioration of the following conditions:  Cardiac failure   Critical care was time spent personally by me on the following activities:  Development of treatment plan with patient or surrogate, discussions with consultants, evaluation of patient's response to treatment, examination of patient, ordering and review of laboratory studies, ordering and review of radiographic studies, ordering and performing treatments and interventions, pulse oximetry, re-evaluation of patient's condition and review of old charts   (including critical care time)  Medical Decision Making / ED Course   MDM:  37 year old female presenting to the  emergency department with shortness of breath  Patient mildly dyspneic appearing although not hypoxic.  Examination notable for decreased basilar breath sounds and lower extremity swelling.  Bedside ultrasound shows severely diminished EF with trace pericardial effusion, no signs of right heart strain.  Suspect acute CHF.  Unclear cause.  Could be myocarditis related to recent influenza.  Will check pregnancy although patient not recently pregnant and denies chance of pregnancy.  Patient is having some chest pain but no family history of early cardiac disease, EF appears globally diminished, EKG without STEMI, lower concern for ACS.  Troponin is mildly elevated, will trend, suspect most likely is demand related.  Patient denies any history of CHF.  Will need to be admitted for further management.  Laboratory testing notable for acute kidney injury likely cardiorenal in the setting of acute CHF.  Also patient hypokalemic, will replete and check magnesium level.  Will need diuretic therapy.  Will need formal echocardiogram.  Consult cardiology  Doubt other causes of dyspnea such as pneumonia or pneumothorax given clear chest x-ray, doubt PE with no signs of right heart strain on echocardiogram, echocardiogram with possible small pericardial effusion but no evidence of tamponade physiology.  Clinical Course as of 04/02/22 2102  Wed Apr 02, 2022  2043 Discussed with Dr. Margaretann Loveless of cardiology who will consult. Will admit to medicine. Now that patient has received potassium will give IV lasix. Will also give magnesium repletion given significant hypokalemia [WS]  2101 Discussed with Dr. Myna Hidalgo who will admit patient for further management and workup of new onset CHF [WS]    Clinical Course User Index [WS] Cristie Hem, MD     Additional history obtained: -Additional history obtained from spouse -External records from outside source obtained and reviewed including: Chart review including previous  notes, labs, imaging, consultation notes including PMD visit today    Lab Tests: -I ordered, reviewed, and interpreted labs.   The pertinent results include:   Labs Reviewed  BASIC METABOLIC PANEL - Abnormal; Notable for the following components:      Result Value  Potassium 2.6 (*)    BUN 24 (*)    Creatinine, Ser 2.62 (*)    Calcium 8.5 (*)    GFR, Estimated 24 (*)    All other components within normal limits  CBC - Abnormal; Notable for the following components:   Hemoglobin 9.8 (*)    HCT 31.1 (*)    MCV 70.7 (*)    MCH 22.3 (*)    RDW 25.0 (*)    nRBC 0.3 (*)    All other components within normal limits  BRAIN NATRIURETIC PEPTIDE - Abnormal; Notable for the following components:   B Natriuretic Peptide 2,272.6 (*)    All other components within normal limits  URINALYSIS, ROUTINE W REFLEX MICROSCOPIC - Abnormal; Notable for the following components:   APPearance CLOUDY (*)    Glucose, UA 50 (*)    Hgb urine dipstick LARGE (*)    Ketones, ur 5 (*)    Protein, ur >=300 (*)    Bacteria, UA FEW (*)    All other components within normal limits  TROPONIN I (HIGH SENSITIVITY) - Abnormal; Notable for the following components:   Troponin I (High Sensitivity) 87 (*)    All other components within normal limits  PREGNANCY, URINE  MAGNESIUM  HEPATIC FUNCTION PANEL  I-STAT BETA HCG BLOOD, ED ([facility], [facility], [facility] ONLY)  TROPONIN I (HIGH SENSITIVITY)    Notable for elevated BNP, troponin likely demand related, AKI likely cardiorenal, hypokalemia  EKG   EKG Interpretation  Date/Time:  Wednesday April 02 2022 15:40:48 EST Ventricular Rate:  108 PR Interval:  150 QRS Duration: 96 QT Interval:  372 QTC Calculation: 498 R Axis:   49 Text Interpretation: Sinus tachycardia Minimal voltage criteria for LVH, may be normal variant ( Cornell product ) Nonspecific ST and T wave abnormality Abnormal ECG No previous ECGs available Confirmed by Garnette Gunner [phone]) on 04/02/2022 6:18:04  PM         Imaging Studies ordered: I ordered imaging studies including CXR On my interpretation imaging demonstrates cardiomegaly I independently visualized and interpreted imaging. I agree with the radiologist interpretation   Medicines ordered and prescription drug management: Meds ordered this encounter  Medications   potassium chloride SA (KLOR-CON M) CR tablet 40 mEq   furosemide (LASIX) injection 20 mg   magnesium sulfate IVPB 2 g 50 mL    -I have reviewed the patients home medicines and have made adjustments as needed   Consultations Obtained: I requested consultation with the cardiologist Dr. Margaretann Loveless,  and discussed lab and imaging findings as well as pertinent plan - they recommend: admit to medicine   Cardiac Monitoring: The patient was maintained on a cardiac monitor.  I personally viewed and interpreted the cardiac monitored which showed an underlying rhythm of: sinus tachycardia  Social Determinants of Health:  Diagnosis or treatment significantly limited by social determinants of health: obesity   Reevaluation: After the interventions noted above, I reevaluated the patient and found that they have improved  Co morbidities that complicate the patient evaluation  Past Medical History:  Diagnosis Date   Anemia    Fibroid    Fibroids    Headache       Dispostion: Disposition decision including need for hospitalization was considered, and patient admitted to the hospital.    Final Clinical Impression(s) / ED Diagnoses Final diagnoses:  Acute systolic congestive heart failure ([place])  Hypokalemia  Acute kidney injury ([place])     This chart was dictated using voice recognition software.  Despite best efforts to proofread,  errors can occur which can change the documentation meaning. Symptoms worsening.  She went to her primary doctor today,   Cristie Hem, MD 04/02/22 2105

## 2022-04-02 NOTE — ED Provider Triage Note (Signed)
Emergency Medicine Provider Triage Evaluation Note  Maria Osborn , a 37 y.o. female  was evaluated in triage.  Pt complains of shortness of breath.  Dyspnea on exertion and orthopnea.  She was recently diagnosed with the flu, then approximately 7 days ago she developed significant shortness of breath.  She also reports bilateral lower extremity edema.  Denies cardiac history.  Also reports chest tightness  Review of Systems  Positive: As above Negative: As above  Physical Exam  BP (!) 169/138   Pulse (!) 111   Temp 97.9 F (36.6 C)   Resp 20   Ht '5\' 5"'$  (1.651 m)   Wt 107 kg   SpO2 99%   BMI 39.27 kg/m  Gen:   Awake, no distress   Resp:  Normal effort  MSK:   Moves extremities without difficulty  Other:  bilateral lower extremity 1+ pitting edema, decreased air movement  Medical Decision Making  Medically screening exam initiated at 3:57 PM.  Appropriate orders placed.  Tynetta [name]-Askew was informed that the remainder of the evaluation will be completed by another provider, this initial triage assessment does not replace that evaluation, and the importance of remaining in the ED until their evaluation is complete.  Shortness of breath workup initiated   Roylene Reason, Hershal Coria 04/02/22 1559

## 2022-04-02 NOTE — H&P (Signed)
History and Physical    Maria Osborn NKN:[id number] DOB: 10/21/1985 DOA: 04/02/2022  PCP: [facility]   Patient coming from: Home   Chief Complaint: SOB, leg swelling   HPI: Maria Osborn is a pleasant 37 y.o. female with medical history significant for hypertension, uterine fibroids, and iron deficiency anemia, now presenting to the emergency department with shortness of breath and leg swelling.  Patient reports several days of progressive exertional dyspnea and is now beginning to feel short of breath even at rest.  She also noticed leg swelling just in the past day or so and extending up into her thighs.  She has orthopnea associated with this and has had difficulty sleeping.  She has not been on antihypertensives for over a year, and has also not been taking iron supplementation for her IDA that has been attributed to uterine fibroids.  ED Course: Upon arrival to the ED, patient is found to be afebrile and saturating well on room air with tachypnea, tachycardia, and elevated blood pressures.  EKG demonstrates sinus tachycardia 308.  Chest x-ray notable for cardiomegaly and airway thickening.  Blood work is most notable for potassium 2.6, creatinine 2.62, hemoglobin 9.8, MCV 70.7, troponin 87, and BNP 2273.  ED physician reports EF appears to be less than 30% on bedside echo.  Cardiology was consulted by the ED physician and the patient was treated with 20 mg IV Lasix, 2 g IV magnesium, and 40 mEq oral potassium.  Review of Systems:  All other systems reviewed and apart from HPI, are negative.  Past Medical History:  Diagnosis Date   Anemia    Fibroid    Fibroids    Headache     Past Surgical History:  Procedure Laterality Date   CESAREAN SECTION N/A 12/26/2017   Procedure: CESAREAN SECTION;  Surgeon: Linda Hedges, DO;  Location: [facility];  Service: Obstetrics;  Laterality: N/A;   CESAREAN SECTION N/A 01/21/2019   Procedure:  CESAREAN SECTION;  Surgeon: Azucena Fallen, MD;  Location: [facility];  Service: Obstetrics;  Laterality: N/A;   DILATION AND CURETTAGE OF UTERUS     HYSTEROSCOPY WITH D & C N/A 06/10/2016   Procedure: DILATATION & CURETTAGE/HYSTEROSCOPY;  Surgeon: Linda Hedges, DO;  Location: [facility];  Service: Gynecology;  Laterality: N/A;   MYOMECTOMY N/A 06/10/2016   Procedure: MYOMECTOMY, abdominal;  Surgeon: Linda Hedges, DO;  Location: [facility];  Service: Gynecology;  Laterality: N/A;    Social History:   reports that she has never smoked. She has never used smokeless tobacco. She reports current alcohol use. She reports that she does not use drugs.  Allergies  Allergen Reactions   Amoxicillin Other (See Comments)    Digestive issues    Family History  Problem Relation Age of Onset   Migraines Father    Stroke Mother    Depression Mother    Lupus Maternal Aunt    Stroke Maternal Uncle      Prior to Admission medications   Medication Sig Start Date End Date Taking? Authorizing Provider  albuterol (VENTOLIN HFA) 108 (90 Base) MCG/ACT inhaler Inhale 1 puff into the lungs every 6 (six) hours as needed for wheezing or shortness of breath.   Yes [provider]  fexofenadine (ALLEGRA) 180 MG tablet Take 180 mg by mouth daily as needed for allergies or rhinitis.   Yes [provider]  fluticasone (FLONASE) 50 MCG/ACT nasal spray Place 1 spray into both nostrils daily as needed for allergies  or rhinitis.   Yes [provider]  HYDROcodone bit-homatropine (HYDROMET) 5-1.5 MG/5ML syrup Take 5 mLs by mouth every 6 (six) hours as needed for cough.   Yes [provider]  promethazine (PHENERGAN) 25 MG tablet Take 25 mg by mouth every 6 (six) hours as needed for nausea or vomiting.   Yes [provider]  acetaminophen (TYLENOL) 500 MG tablet Take 2 tablets (1,000 mg total) by mouth every 6 (six) hours. Patient not taking: Reported on 04/02/2022 01/23/19   Azucena Fallen, MD  labetalol (NORMODYNE) 100 MG tablet Take 1 tablet (100 mg total) by mouth 3 (three) times daily. Patient not taking: Reported on 04/02/2022 01/23/19   Azucena Fallen, MD    Physical Exam: Vitals:   04/02/22 1544 04/02/22 2003 04/02/22 2015 04/02/22 2030  BP: (!) 169/138  (!) 185/138 (!) 186/128  Pulse: (!) 111  (!) 107 (!) 103  Resp: 20  (!) 34 (!) 30  Temp: 97.9 F (36.6 C) 97.8 F (36.6 C)    TempSrc:  Oral    SpO2: 99%  100% 99%  Weight:      Height:        Constitutional: NAD, calm  Eyes: PERTLA, lids and conjunctivae normal ENMT: Mucous membranes are moist. Posterior pharynx clear of any exudate or lesions.   Neck: supple, no masses  Respiratory: Dyspnea with speech, no wheezing. No accessory muscle use.  Cardiovascular: S1 & S2 heard, regular rate and rhythm. B/l LE edema.   Abdomen: No distension, no tenderness, soft. Bowel sounds active.  Musculoskeletal: no clubbing / cyanosis. No joint deformity upper and lower extremities.   Skin: no significant rashes, lesions, ulcers. Warm, dry, well-perfused. Neurologic: CN 2-12 grossly intact. Moving all extremities. Alert and oriented.  Psychiatric: Pleasant. Cooperative.    Labs and Imaging on Admission: I have personally reviewed following labs and imaging studies  CBC: Recent Labs  Lab 04/02/22 1546  WBC 9.2  HGB 9.8*  HCT 31.1*  MCV 70.7*  PLT 939   Basic Metabolic Panel: Recent Labs  Lab 04/02/22 1546  NA 138  K 2.6*  CL 102  CO2 25  GLUCOSE 87  BUN 24*  CREATININE 2.62*  CALCIUM 8.5*   GFR: Estimated Creatinine Clearance: 36.4 mL/min (A) (by C-G formula based on SCr of 2.62 mg/dL (H)). Liver Function Tests: No results for input(s): "AST", "ALT", "ALKPHOS", "BILITOT", "PROT", "ALBUMIN" in the last 168 hours. No results for input(s): "LIPASE", "AMYLASE" in the last 168 hours. No results for input(s): "AMMONIA" in the last 168 hours. Coagulation Profile: No results for input(s): "INR",  "PROTIME" in the last 168 hours. Cardiac Enzymes: No results for input(s): "CKTOTAL", "CKMB", "CKMBINDEX", "TROPONINI" in the last 168 hours. BNP (last 3 results) No results for input(s): "PROBNP" in the last 8760 hours. HbA1C: No results for input(s): "HGBA1C" in the last 72 hours. CBG: No results for input(s): "GLUCAP" in the last 168 hours. Lipid Profile: No results for input(s): "CHOL", "HDL", "LDLCALC", "TRIG", "CHOLHDL", "LDLDIRECT" in the last 72 hours. Thyroid Function Tests: No results for input(s): "TSH", "T4TOTAL", "FREET4", "T3FREE", "THYROIDAB" in the last 72 hours. Anemia Panel: No results for input(s): "VITAMINB12", "FOLATE", "FERRITIN", "TIBC", "IRON", "RETICCTPCT" in the last 72 hours. Urine analysis:    Component Value Date/Time   COLORURINE YELLOW 04/02/2022 1954   APPEARANCEUR CLOUDY (A) 04/02/2022 1954   LABSPEC 1.013 04/02/2022 1954   PHURINE 5.0 04/02/2022 1954   GLUCOSEU 50 (A) 04/02/2022 1954   HGBUR LARGE (A)  04/02/2022 [place] NEGATIVE 04/02/2022 1954   KETONESUR 5 (A) 04/02/2022 1954   PROTEINUR >=300 (A) 04/02/2022 1954   NITRITE NEGATIVE 04/02/2022 1954   LEUKOCYTESUR NEGATIVE 04/02/2022 1954   Sepsis Labs: '@LABRCNTIP'$ (procalcitonin:4,lacticidven:4) )No results found for this or any previous visit (from the past 240 hour(s)).   Radiological Exams on Admission: DG Chest 2 View  Result Date: 04/02/2022 CLINICAL DATA:  Shortness of breath, cough, and chest pain. EXAM: CHEST - 2 VIEW COMPARISON:  None Available. FINDINGS: The cardiac silhouette is mildly to moderately enlarged. The lungs are hypoinflated. There is moderate airway thickening. No focal airspace consolidation, overt pulmonary edema, sizable pleural effusion, or pneumothorax is identified. No acute osseous abnormality is seen. IMPRESSION: 1. Airway thickening which may reflect bronchitis. 2. Cardiomegaly. Electronically Signed   By: Logan Bores M.D.   On: 04/02/2022 16:18     EKG: Independently reviewed. Sinus tachycardia, rate 108.   Assessment/Plan   1. Acute systolic CHF  - Presents with recent SOB, orthopnea, b/l leg swelling, BNP 2273, and has low EF on bedside echo per ED physician  - Cardiology consulted by ED physician and formal echo ordered  - Diurese with 40 mg IV Lasix q12h, monitor wt and I/Os, check formal echocardiogram, monitor renal function and electrolytes    2. Hypertensive urgency  - She reports hx of HTN but has been off of antihypertensives for >1 yr  - Anticipate improvement with diuresis, will use oral hydralazine as well and start nitroglycerin infusion if SBP remains >180 or DBP >100 despite Lasix and hydralazine    3. AKI  - SCr is 2.62 on admission, up from 0.79 in November 2020  - Suspected this is acute though most recent labs are >3 yrs old  - Proteinuria noted and "large Hgb" noted on urine dipstick and 6-10 rbc on micro - Renally-dose medications, check CK, follow closely while diuresing   4. Anemia  - Hx of IDA attributed to AUB/uterine fibroids  - She has been off of iron for >1 yr  - No active bleeding, she plans to resume iron and outpatient follow-up    5. Hypokalemia; hypomagnesemia  - Given 40 mEq oral potassium and 2 g IV mag in ED  - Give additional potassium IV now, follow electrolytes closely while diuresing    6. Elevated troponin  - Troponin 87 on admission without chest pain  - Likely supply-demand mismatch in setting of decompensated CHF   - Trend troponin, diurese and control BP as above, follow-up echo findings    DVT prophylaxis: sq heparin  Code Status: Full  Level of Care: Level of care: Telemetry Cardiac Family Communication: None present   Disposition Plan:  Patient is from: home  Anticipated d/c is to: Home  Anticipated d/c date is: 04/04/22  Patient currently: Pending echocardiogram, cardiology consultation, stable renal function   Consults called: Cardiology  Admission status:  Inpatient     Vianne Bulls, MD [facility]  04/02/2022, 9:07 PM

## 2022-04-03 ENCOUNTER — Inpatient Hospital Stay (HOSPITAL_COMMUNITY): Payer: 59

## 2022-04-03 DIAGNOSIS — I5021 Acute systolic (congestive) heart failure: Secondary | ICD-10-CM

## 2022-04-03 LAB — CBC
HCT: 32.8 % — ABNORMAL LOW (ref 36.0–46.0)
Hemoglobin: 10.3 g/dL — ABNORMAL LOW (ref 12.0–15.0)
MCH: 22.3 pg — ABNORMAL LOW (ref 26.0–34.0)
MCHC: 31.4 g/dL (ref 30.0–36.0)
MCV: 71 fL — ABNORMAL LOW (ref 80.0–100.0)
Platelets: 254 10*3/uL (ref 150–400)
RBC: 4.62 MIL/uL (ref 3.87–5.11)
RDW: 25.7 % — ABNORMAL HIGH (ref 11.5–15.5)
WBC: 10 10*3/uL (ref 4.0–10.5)
nRBC: 0.2 % (ref 0.0–0.2)

## 2022-04-03 LAB — BASIC METABOLIC PANEL
Anion gap: 12 (ref 5–15)
BUN: 23 mg/dL — ABNORMAL HIGH (ref 6–20)
CO2: 24 mmol/L (ref 22–32)
Calcium: 8.6 mg/dL — ABNORMAL LOW (ref 8.9–10.3)
Chloride: 101 mmol/L (ref 98–111)
Creatinine, Ser: 2.57 mg/dL — ABNORMAL HIGH (ref 0.44–1.00)
GFR, Estimated: 24 mL/min — ABNORMAL LOW (ref >60)
Glucose, Bld: 106 mg/dL — ABNORMAL HIGH (ref 70–99)
Potassium: 2.8 mmol/L — ABNORMAL LOW (ref 3.5–5.1)
Sodium: 137 mmol/L (ref 135–145)

## 2022-04-03 LAB — URINALYSIS, COMPLETE (UACMP) WITH MICROSCOPIC
Bacteria, UA: NONE SEEN
Bilirubin Urine: NEGATIVE
Glucose, UA: NEGATIVE mg/dL
Ketones, ur: 5 mg/dL — AB
Leukocytes,Ua: NEGATIVE
Nitrite: NEGATIVE
Protein, ur: 100 mg/dL — AB
Specific Gravity, Urine: 1.005 (ref 1.005–1.030)
pH: 6 (ref 5.0–8.0)

## 2022-04-03 LAB — PROTEIN / CREATININE RATIO, URINE
Creatinine, Urine: 23 mg/dL
Protein Creatinine Ratio: 3.22 mg/mg{Cre} — ABNORMAL HIGH (ref 0.00–0.15)
Total Protein, Urine: 74 mg/dL

## 2022-04-03 LAB — FOLATE: Folate: 30.7 ng/mL (ref >5.9)

## 2022-04-03 LAB — ECHOCARDIOGRAM COMPLETE
Calc EF: 29.6 %
Est EF: <20
MV M vel: 3.63 m/s
MV Peak grad: 52.8 mmHg
MV VTI: 1.67 cm2
S' Lateral: 5.1 cm
Single Plane A2C EF: 36.5 %
Single Plane A4C EF: 24.8 %

## 2022-04-03 LAB — MAGNESIUM: Magnesium: 2.1 mg/dL (ref 1.7–2.4)

## 2022-04-03 LAB — POTASSIUM: Potassium: 2.6 mmol/L — CL (ref 3.5–5.1)

## 2022-04-03 LAB — TROPONIN I (HIGH SENSITIVITY): Troponin I (High Sensitivity): 86 ng/L — ABNORMAL HIGH (ref <18)

## 2022-04-03 LAB — HIV ANTIBODY (ROUTINE TESTING W REFLEX): HIV Screen 4th Generation wRfx: NONREACTIVE

## 2022-04-03 LAB — CK: Total CK: 201 U/L (ref 38–234)

## 2022-04-03 LAB — TSH: TSH: 1.722 u[IU]/mL (ref 0.350–4.500)

## 2022-04-03 MED ORDER — POTASSIUM CHLORIDE 10 MEQ/100ML IV SOLN
10.0000 meq | INTRAVENOUS | Status: DC
  Filled 2022-04-03: qty 100

## 2022-04-03 MED ORDER — ISOSORB DINITRATE-HYDRALAZINE 20-37.5 MG PO TABS
1.0000 | ORAL_TABLET | Freq: Three times a day (TID) | ORAL | Status: DC
  Administered 2022-04-03 – 2022-04-04 (×3): 1 via ORAL
  Filled 2022-04-03 (×3): qty 1

## 2022-04-03 MED ORDER — POTASSIUM CHLORIDE CRYS ER 20 MEQ PO TBCR
40.0000 meq | EXTENDED_RELEASE_TABLET | ORAL | Status: AC
  Administered 2022-04-03 (×2): 40 meq via ORAL
  Filled 2022-04-03 (×2): qty 2

## 2022-04-03 MED ORDER — ACETAMINOPHEN 325 MG PO TABS
650.0000 mg | ORAL_TABLET | Freq: Four times a day (QID) | ORAL | Status: DC | PRN
  Administered 2022-04-03 – 2022-04-07 (×5): 650 mg via ORAL
  Filled 2022-04-03 (×5): qty 2

## 2022-04-03 MED ORDER — AMLODIPINE BESYLATE 5 MG PO TABS
5.0000 mg | ORAL_TABLET | Freq: Every day | ORAL | Status: DC
  Administered 2022-04-03 – 2022-04-05 (×3): 5 mg via ORAL
  Filled 2022-04-03 (×3): qty 1

## 2022-04-03 MED ORDER — POTASSIUM CHLORIDE 10 MEQ/100ML IV SOLN: 10.0000 meq | Freq: Once | INTRAVENOUS | Status: DC

## 2022-04-03 MED ORDER — PERFLUTREN LIPID MICROSPHERE
1.0000 mL | INTRAVENOUS | Status: AC | PRN
  Administered 2022-04-03: 2 mL via INTRAVENOUS

## 2022-04-03 MED ORDER — POTASSIUM CHLORIDE CRYS ER 20 MEQ PO TBCR
40.0000 meq | EXTENDED_RELEASE_TABLET | Freq: Once | ORAL | Status: AC
  Administered 2022-04-03: 40 meq via ORAL
  Filled 2022-04-03: qty 2

## 2022-04-03 MED ORDER — POTASSIUM CHLORIDE 10 MEQ/100ML IV SOLN
10.0000 meq | Freq: Once | INTRAVENOUS | Status: AC
  Administered 2022-04-03: 10 meq via INTRAVENOUS
  Filled 2022-04-03: qty 100

## 2022-04-03 MED ORDER — POTASSIUM CHLORIDE CRYS ER 20 MEQ PO TBCR: 40.0000 meq | EXTENDED_RELEASE_TABLET | Freq: Once | ORAL | Status: DC

## 2022-04-03 MED ORDER — LORAZEPAM 2 MG/ML IJ SOLN
0.5000 mg | Freq: Once | INTRAMUSCULAR | Status: AC | PRN
  Administered 2022-04-04: 0.5 mg via INTRAVENOUS
  Filled 2022-04-03: qty 1

## 2022-04-03 NOTE — Hospital Course (Addendum)
37 y.o.F /hypertension, uterine fibroids, and iron deficiency anemia, presented with shortness of breath leg swelling progressively worsening for several days.  Complain of dyspnea on exertion.  Reports she recently had flu and bronchitis,, previously no history of congestive heart failure or cardiac disease.  Seen in the ED saturating well on room air with tachypnea and tachycardia elevated blood pressure EKG with sinus tachycardia chest x-ray for cardiomegaly airway thickening blood work showed hypokalemia elevated creatinine elevated BNP, positive troponin anemia Per ED physician found to have reduced EF of 30% on bedside echo cardiology consulted Lasix given along with potassium supplement and admitted

## 2022-04-03 NOTE — ED Notes (Signed)
ED TO INPATIENT HANDOFF REPORT  ED Nurse Name and Phone #: cori [phone]  S Name/Age/Gender Maria Osborn 37 y.o. female Room/Bed: 002C/002C  Code Status   Code Status: Full Code  Home/SNF/Other Home Patient oriented to: self, place, time, and situation Is this baseline? Yes   Triage Complete: Triage complete  Chief Complaint Acute systolic CHF (congestive heart failure) (Moneisha Vosler) [I50.21]  Triage Note Pt reports having the flu in December, being diagnosed with bronchitis at the beginning of January, and having worsening sob, along with heart racing, since Monday. Patient went to UC for same today and was told she had an enlarged heart and to proceed to the ED. Endorses sharp upper back pain.    Allergies Allergies  Allergen Reactions   Amoxicillin Other (See Comments)    Digestive issues    Level of Care/Admitting Diagnosis ED Disposition     ED Disposition  Admit   Condition  --   Faxon: [facility] [[id number]]  Level of Care: Progressive [102]  Admit to Progressive based on following criteria: CARDIOVASCULAR & THORACIC of moderate stability with acute coronary syndrome symptoms/low risk myocardial infarction/hypertensive urgency/arrhythmias/heart failure potentially compromising stability and stable post cardiovascular intervention patients.  May admit patient to [facility] or [facility] if equivalent level of care is available:: No  Covid Evaluation: Asymptomatic - no recent exposure (last 10 days) testing not required  Diagnosis: Acute systolic CHF (congestive heart failure) [facility]) [[id number]]  Admitting Physician: Vianne Bulls [[id number]]  Attending Physician: Vianne Bulls [[id number]]  Certification:: I certify this patient will need inpatient services for at least 2 midnights  Estimated Length of Stay: 3          B Medical/Surgery History Past Medical History:  Diagnosis Date   Anemia    Fibroid    Fibroids     Headache    Past Surgical History:  Procedure Laterality Date   CESAREAN SECTION N/A 12/26/2017   Procedure: CESAREAN SECTION;  Surgeon: Linda Hedges, DO;  Location: [place];  Service: Obstetrics;  Laterality: N/A;   CESAREAN SECTION N/A 01/21/2019   Procedure: CESAREAN SECTION;  Surgeon: Azucena Fallen, MD;  Location: [place] [facility];  Service: Obstetrics;  Laterality: N/A;   DILATION AND CURETTAGE OF UTERUS     HYSTEROSCOPY WITH D & C N/A 06/10/2016   Procedure: DILATATION & CURETTAGE/HYSTEROSCOPY;  Surgeon: Linda Hedges, DO;  Location: [place] [facility];  Service: Gynecology;  Laterality: N/A;   MYOMECTOMY N/A 06/10/2016   Procedure: MYOMECTOMY, abdominal;  Surgeon: Linda Hedges, DO;  Location: [place] [facility];  Service: Gynecology;  Laterality: N/A;     A IV Location/Drains/Wounds Patient Lines/Drains/Airways Status     Active Line/Drains/Airways     Name Placement date Placement time Site Days   Peripheral IV 04/02/22 20 G Anterior;Left;Proximal Antecubital 04/02/22  2040  Antecubital  1   Incision (Closed) 06/10/16 Abdomen 06/10/16  0808  -- 2123   Incision (Closed) 06/10/16 Perineum 06/10/16  0808  -- 2123   Incision (Closed) 12/27/17 Abdomen 12/27/17  0052  -- 1558   Incision (Closed) 12/27/17 Perineum 12/27/17  0052  -- 1558   Incision (Closed) 01/21/19 Abdomen Other (Comment) 01/21/19  1618  -- 1168   Incision (Closed) 01/21/19 Perineum Other (Comment) 01/21/19  1618  -- 1168            Intake/Output Last 24 hours  Intake/Output Summary (Last 24 hours) at 04/03/2022 1038 Last data filed at 04/03/2022 7262 Gross  per 24 hour  Intake 306.4 ml  Output 1200 ml  Net -893.6 ml    Labs/Imaging Results for orders placed or performed during the hospital encounter of 04/02/22 (from the past 48 hour(s))  Basic metabolic panel     Status: Abnormal   Collection Time: 04/02/22  3:46 PM  Result Value Ref Range   Sodium 138 135 - 145 mmol/L   Potassium 2.6 (LL) 3.5 - 5.1 mmol/L    Comment:  CRITICAL RESULT CALLED TO, READ BACK BY AND VERIFIED WITH: A KALFUT,RN 1805 04/02/2022 WBOND    Chloride 102 98 - 111 mmol/L   CO2 25 22 - 32 mmol/L   Glucose, Bld 87 70 - 99 mg/dL    Comment: Glucose reference range applies only to samples taken after fasting for at least 8 hours.   BUN 24 (H) 6 - 20 mg/dL   Creatinine, Ser 2.62 (H) 0.44 - 1.00 mg/dL   Calcium 8.5 (L) 8.9 - 10.3 mg/dL   GFR, Estimated 24 (L) >60 mL/min    Comment: (NOTE) Calculated using the CKD-EPI Creatinine Equation (2021)    Anion gap 11 5 - 15    Comment: Performed at [facility] [address] [place], [place] [address]  CBC     Status: Abnormal   Collection Time: 04/02/22  3:46 PM  Result Value Ref Range   WBC 9.2 4.0 - 10.5 K/uL   RBC 4.40 3.87 - 5.11 MIL/uL   Hemoglobin 9.8 (L) 12.0 - 15.0 g/dL   HCT 31.1 (L) 36.0 - 46.0 %   MCV 70.7 (L) 80.0 - 100.0 fL   MCH 22.3 (L) 26.0 - 34.0 pg   MCHC 31.5 30.0 - 36.0 g/dL   RDW 25.0 (H) 11.5 - 15.5 %   Platelets 235 150 - 400 K/uL    Comment: REPEATED TO VERIFY   nRBC 0.3 (H) 0.0 - 0.2 %    Comment: Performed at [facility], [place] [address], [place], [place] [address]  Troponin I (High Sensitivity)     Status: Abnormal   Collection Time: 04/02/22  3:46 PM  Result Value Ref Range   Troponin I (High Sensitivity) 87 (H) <18 ng/L    Comment: (NOTE) Elevated high sensitivity troponin I (hsTnI) values and significant  changes across serial measurements may suggest ACS but many other  chronic and acute conditions are known to elevate hsTnI results.  Refer to the Links section for chest pain algorithms and additional  guidance. Performed at [facility], [place] [address], [place], [place] [address]   Brain natriuretic peptide     Status: Abnormal   Collection Time: 04/02/22  4:30 PM  Result Value Ref Range   B Natriuretic Peptide 2,272.6 (H) 0.0 - 100.0 pg/mL    Comment: Performed at [facility] [address], [place], [place]  [address]  I-Stat beta hCG blood, ED     Status: None   Collection Time: 04/02/22  7:27 PM  Result Value Ref Range   I-stat hCG, quantitative <5.0 <5 mIU/mL   Comment 3            Comment:   GEST. AGE      CONC.  (mIU/mL)   <=1 WEEK        5 - 50     2 WEEKS       50 - 500     3 WEEKS       100 -  10,000     4 WEEKS     1,000 - 30,000        FEMALE AND NON-PREGNANT FEMALE:     LESS THAN 5 mIU/mL   Urinalysis, Routine w reflex microscopic -Urine, Clean Catch     Status: Abnormal   Collection Time: 04/02/22  7:54 PM  Result Value Ref Range   Color, Urine YELLOW YELLOW   APPearance CLOUDY (A) CLEAR   Specific Gravity, Urine 1.013 1.005 - 1.030   pH 5.0 5.0 - 8.0   Glucose, UA 50 (A) NEGATIVE mg/dL   Hgb urine dipstick LARGE (A) NEGATIVE   Bilirubin Urine NEGATIVE NEGATIVE   Ketones, ur 5 (A) NEGATIVE mg/dL   Protein, ur >=300 (A) NEGATIVE mg/dL   Nitrite NEGATIVE NEGATIVE   Leukocytes,Ua NEGATIVE NEGATIVE   RBC / HPF 6-10 0 - 5 RBC/hpf   WBC, UA 11-20 0 - 5 WBC/hpf   Bacteria, UA FEW (A) NONE SEEN   Squamous Epithelial / HPF 11-20 0 - 5 /HPF   Mucus PRESENT     Comment: Performed at [facility], [place] [address] [place], [place] [address]  Pregnancy, urine     Status: None   Collection Time: 04/02/22  7:54 PM  Result Value Ref Range   Preg Test, Ur NEGATIVE NEGATIVE    Comment:        THE SENSITIVITY OF THIS METHODOLOGY IS >20 mIU/mL. Performed at [facility], [place] [address], [place], [place] [address]   Magnesium     Status: Abnormal   Collection Time: 04/02/22  8:40 PM  Result Value Ref Range   Magnesium 1.6 (L) 1.7 - 2.4 mg/dL    Comment: Performed at [facility] [address] [place], [place] [address]  Hepatic function panel     Status: Abnormal   Collection Time: 04/02/22  8:40 PM  Result Value Ref Range   Total Protein 6.6 6.5 - 8.1 g/dL   Albumin 2.9 (L) 3.5 - 5.0 g/dL   AST 19 15 - 41 U/L   ALT 17 0 - 44 U/L   Alkaline Phosphatase 65 38 -  126 U/L   Total Bilirubin 1.2 0.3 - 1.2 mg/dL   Bilirubin, Direct 0.2 0.0 - 0.2 mg/dL   Indirect Bilirubin 1.0 (H) 0.3 - 0.9 mg/dL    Comment: Performed at [facility] [address] [place], [place] [address]  Troponin I (High Sensitivity)     Status: Abnormal   Collection Time: 04/02/22 10:49 PM  Result Value Ref Range   Troponin I (High Sensitivity) 86 (H) <18 ng/L    Comment: (NOTE) Elevated high sensitivity troponin I (hsTnI) values and significant  changes across serial measurements may suggest ACS but many other  chronic and acute conditions are known to elevate hsTnI results.  Refer to the "Links" section for chest pain algorithms and additional  guidance. Performed at [facility], [place] [address], [place], [place] [address]   HIV Antibody (routine testing w rflx)     Status: None   Collection Time: 04/02/22 10:49 PM  Result Value Ref Range   HIV Screen 4th Generation wRfx Non Reactive Non Reactive    Comment: Performed at [facility], [place] [address] [place], [place] [address]  Vitamin B12     Status: Abnormal   Collection Time: 04/02/22 10:49 PM  Result Value Ref Range   Vitamin B-12 1,387 (H) 180 - 914 pg/mL  Comment: (NOTE) This assay is not validated for testing neonatal or myeloproliferative syndrome specimens for Vitamin B12 levels. Performed at [facility], [place] [address] [place], [place] [address]   Reticulocytes     Status: Abnormal   Collection Time: 04/02/22 10:49 PM  Result Value Ref Range   Retic Ct Pct 2.7 0.4 - 3.1 %   RBC. 5.72 (H) 3.87 - 5.11 MIL/uL   Retic Count, Absolute 152.7 19.0 - 186.0 K/uL   Immature Retic Fract 33.7 (H) 2.3 - 15.9 %    Comment: Performed at [facility] [address], [place], [place] [address]  Iron and TIBC     Status: None   Collection Time: 04/02/22 10:49 PM  Result Value Ref Range   Iron 47 28 - 170 ug/dL   TIBC 388 250 - 450 ug/dL   Saturation Ratios 12 10.4 - 31.8 %   UIBC  341 ug/dL    Comment: Performed at [facility], [place] [address] [place], [place] [address]  Ferritin     Status: None   Collection Time: 04/02/22 10:49 PM  Result Value Ref Range   Ferritin 20 11 - 307 ng/mL    Comment: Performed at [facility], [place] [address] [place], [place] [address]  CK     Status: None   Collection Time: 04/02/22 10:49 PM  Result Value Ref Range   Total CK 201 38 - 234 U/L    Comment: HEMOLYSIS AT THIS LEVEL MAY AFFECT RESULT Performed at [facility], [place] [address] [place], [place] [address]   Folate     Status: None   Collection Time: 04/02/22 10:49 PM  Result Value Ref Range   Folate 30.7 >5.9 ng/mL    Comment: Performed at [facility], [place] [address], [place], [place] [address]  Basic metabolic panel     Status: Abnormal   Collection Time: 04/03/22  5:08 AM  Result Value Ref Range   Sodium 137 135 - 145 mmol/L   Potassium 2.8 (L) 3.5 - 5.1 mmol/L   Chloride 101 98 - 111 mmol/L   CO2 24 22 - 32 mmol/L   Glucose, Bld 106 (H) 70 - 99 mg/dL    Comment: Glucose reference range applies only to samples taken after fasting for at least 8 hours.   BUN 23 (H) 6 - 20 mg/dL   Creatinine, Ser 2.57 (H) 0.44 - 1.00 mg/dL   Calcium 8.6 (L) 8.9 - 10.3 mg/dL   GFR, Estimated 24 (L) >60 mL/min    Comment: (NOTE) Calculated using the CKD-EPI Creatinine Equation (2021)    Anion gap 12 5 - 15    Comment: Performed at [facility] [address], [place], [place] [address]  Magnesium     Status: None   Collection Time: 04/03/22  5:08 AM  Result Value Ref Range   Magnesium 2.1 1.7 - 2.4 mg/dL    Comment: Performed at [facility] [address] [place], [place] [address]  CBC     Status: Abnormal   Collection Time: 04/03/22  5:08 AM  Result Value Ref Range   WBC 10.0 4.0 - 10.5 K/uL   RBC 4.62 3.87 - 5.11 MIL/uL   Hemoglobin 10.3 (L) 12.0 - 15.0 g/dL   HCT 32.8 (L) 36.0 - 46.0 %   MCV 71.0 (L) 80.0 - 100.0 fL   MCH 22.3  (L) 26.0 - 34.0 pg  MCHC 31.4 30.0 - 36.0 g/dL   RDW 25.7 (H) 11.5 - 15.5 %   Platelets 254 150 - 400 K/uL    Comment: REPEATED TO VERIFY   nRBC 0.2 0.0 - 0.2 %    Comment: Performed at [facility], [place] [address] [place], [place] [address]   ECHOCARDIOGRAM COMPLETE  Result Date: 04/03/2022    ECHOCARDIOGRAM REPORT   Patient Name:   Maria Osborn Date of Exam: 04/03/2022 Medical Rec #:  [id number]              Height:       65.0 in Accession #:    [id number]             Weight:       236.0 lb Date of Birth:  1985/06/09             BSA:          2.122 m Patient Age:    36 years               BP:           144/91 mmHg Patient Gender: F                      HR:           104 bpm. Exam Location:  Inpatient Procedure: 2D Echo, Color Doppler, Cardiac Doppler and Intracardiac            Opacification Agent STAT ECHO Indications:    CHF - Acute Systolic  History:        Patient has no prior history of Echocardiogram examinations.                 Signs/Symptoms:Shortness of Breath, Dyspnea, Orthopnea and                 Edema; Risk Factors:Hypertension. Iron deficiency anemia.  Sonographer:    Eartha Inch Referring Phys: [id number] TIMOTHY S OPYD  Sonographer Comments: Image acquisition challenging due to patient body habitus and Image acquisition challenging due to respiratory motion. IMPRESSIONS 1. The left ventricle is mildly dilated with global hypokinesis and severely reduced systolic function. Estimated LV ejection fraction is 20-25%. 2. The RV appears normal in size and function. 3. There is mild mitral and tricuspid regurgitation. 4. There is a moderate to large left pleural effusion. FINDINGS  Left Ventricle: Left ventricular ejection fraction, by estimation, is <20%. The left ventricle has severely decreased function. The left ventricle demonstrates global hypokinesis. Definity contrast agent was given IV to delineate the left ventricular endocardial borders. The left ventricular internal  cavity size was normal in size. There is no left ventricular hypertrophy. Left ventricular diastolic parameters are consistent with Grade I diastolic dysfunction (impaired relaxation). Right Ventricle: The right ventricular size is normal. No increase in right ventricular wall thickness. Right ventricular systolic function is normal. Left Atrium: Left atrial size was normal in size. Right Atrium: Right atrial size was normal in size. Pericardium: There is no evidence of pericardial effusion. Mitral Valve: The mitral valve is normal in structure. Mild mitral valve regurgitation. No evidence of mitral valve stenosis. MV peak gradient, 4.1 mmHg. The mean mitral valve gradient is 2.0 mmHg. Tricuspid Valve: The tricuspid valve is normal in structure. Tricuspid valve regurgitation is not demonstrated. No evidence of tricuspid stenosis. Aortic Valve: The aortic valve is normal in structure. Aortic valve regurgitation is not visualized. No aortic stenosis is present. Pulmonic  Valve: The pulmonic valve was normal in structure. Pulmonic valve regurgitation is trivial. No evidence of pulmonic stenosis. Aorta: The aortic root is normal in size and structure. Venous: The inferior vena cava is normal in size with greater than 50% respiratory variability, suggesting right atrial pressure of 3 mmHg. IAS/Shunts: No atrial level shunt detected by color flow Doppler. Additional Comments: There is a large pleural effusion in the left lateral region.  LEFT VENTRICLE PLAX 2D LVIDd:         5.90 cm      Diastology LVIDs:         5.10 cm      LV e' medial:  6.73 cm/s LV PW:         1.10 cm      LV e' lateral: 6.08 cm/s LV IVS:        0.90 cm LVOT diam:     2.10 cm LV SV:         46 LV SV Index:   22 LVOT Area:     3.46 cm  LV Volumes (MOD) LV vol d, MOD A2C: 141.0 ml LV vol d, MOD A4C: 145.0 ml LV vol s, MOD A2C: 89.5 ml LV vol s, MOD A4C: 109.0 ml LV SV MOD A2C:     51.5 ml LV SV MOD A4C:     145.0 ml LV SV MOD BP:      42.0 ml RIGHT  VENTRICLE            IVC RV S prime:     8.58 cm/s  IVC diam: 1.90 cm TAPSE (M-mode): 1.1 cm LEFT ATRIUM             Index        RIGHT ATRIUM           Index LA diam:        4.60 cm 2.17 cm/m   RA Area:     10.50 cm LA Vol (A2C):   37.3 ml 17.58 ml/m  RA Volume:   18.20 ml  8.58 ml/m LA Vol (A4C):   71.4 ml 33.64 ml/m LA Biplane Vol: 56.0 ml 26.39 ml/m  AORTIC VALVE LVOT Vmax:   92.70 cm/s LVOT Vmean:  65.000 cm/s LVOT VTI:    0.132 m  AORTA Ao Root diam: 3.10 cm Ao Asc diam:  3.40 cm MITRAL VALVE MV Area VTI:  1.67 cm    SHUNTS MV Peak grad: 4.1 mmHg    Systemic VTI:  0.13 m MV Mean grad: 2.0 mmHg    Systemic Diam: 2.10 cm MV Vmax:      1.01 m/s MV Vmean:     59.0 cm/s MR Peak grad: 52.8 mmHg MR Mean grad: 75.0 mmHg MR Vmax:      363.33 cm/s MR Vmean:     408.0 cm/s Aditya Sabharwal Electronically signed by Hebert Soho Signature Date/Time: 04/03/2022/9:52:25 AM    Final    DG Chest 2 View  Result Date: 04/02/2022 CLINICAL DATA:  Shortness of breath, cough, and chest pain. EXAM: CHEST - 2 VIEW COMPARISON:  None Available. FINDINGS: The cardiac silhouette is mildly to moderately enlarged. The lungs are hypoinflated. There is moderate airway thickening. No focal airspace consolidation, overt pulmonary edema, sizable pleural effusion, or pneumothorax is identified. No acute osseous abnormality is seen. IMPRESSION: 1. Airway thickening which may reflect bronchitis. 2. Cardiomegaly. Electronically Signed   By: Logan Bores M.D.   On: 04/02/2022 16:18    Pending Labs Unresulted  Labs (From admission, onward)     Start     Ordered   04/03/22 5956  Basic metabolic panel  Daily,   R      04/03/22 0050   04/03/22 0500  Magnesium  Daily,   R      04/03/22 0050   04/03/22 0500  CBC  Daily,   R      04/03/22 0050            Vitals/Pain Today's Vitals   04/03/22 1015 04/03/22 1025 04/03/22 1030 04/03/22 1037  BP: (!) 161/108 (!) 164/103 (!) 164/105   Pulse: (!) 103 (!) 114 (!) 107   Resp: (!)  27 (!) 30 (!) 32   Temp:   (!) 97.5 F (36.4 C) (!) 97.5 F (36.4 C)  TempSrc:   Oral Oral  SpO2: 100% 100% 100%   Weight:      Height:      PainSc:   8      Isolation Precautions No active isolations  Medications Medications  heparin injection 5,000 Units (5,000 Units Subcutaneous Given 04/03/22 0517)  furosemide (LASIX) injection 40 mg (40 mg Intravenous Given 04/03/22 1032)  hydrALAZINE (APRESOLINE) tablet 50 mg (50 mg Oral Given 04/02/22 2229)  zolpidem (AMBIEN) tablet 5 mg (5 mg Oral Given 04/03/22 0004)  nitroGLYCERIN 50 mg in dextrose 5 % 250 mL (0.2 mg/mL) infusion (70 mcg/min Intravenous Rate/Dose Change 04/03/22 0944)  LORazepam (ATIVAN) injection 0.5 mg (has no administration in time range)  acetaminophen (TYLENOL) tablet 650 mg (650 mg Oral Given 04/03/22 0515)  perflutren lipid microspheres (DEFINITY) IV suspension (2 mLs Intravenous Given 04/03/22 0906)  potassium chloride SA (KLOR-CON M) CR tablet 40 mEq (40 mEq Oral Given 04/02/22 2032)  magnesium sulfate IVPB 2 g 50 mL (0 g Intravenous Stopped 04/02/22 2345)  potassium chloride 10 mEq in 100 mL IVPB (0 mEq Intravenous Stopped 04/03/22 0141)  furosemide (LASIX) injection 40 mg (40 mg Intravenous Given 04/02/22 2245)  potassium chloride SA (KLOR-CON M) CR tablet 40 mEq (40 mEq Oral Given 04/03/22 0619)  potassium chloride 10 mEq in 100 mL IVPB (0 mEq Intravenous Stopped 04/03/22 0823)    Mobility walks     Focused Assessments Pulmonary Assessment Handoff:  Lung sounds:   O2 Device: Nasal Cannula O2 Flow Rate (L/min): 2 L/min    R Recommendations: See Admitting Provider Note  Report given to:   Additional Notes: patient is on a nitro drip at 75 mcg/min:

## 2022-04-03 NOTE — Progress Notes (Signed)
Heart Failure Navigator Progress Note  Assessed for Heart & Vascular TOC clinic readiness.  Patient has a Advanced Heart Failure Team consult. .   Navigator will sign off at this time.    Earnestine Leys, BSN, Clinical cytogeneticist Only

## 2022-04-03 NOTE — Progress Notes (Signed)
Attending made aware of patient's intolerance of the IV potassium and that she had 15 beats of vtach. New orders received.

## 2022-04-03 NOTE — Progress Notes (Signed)
Pt placed on BIPAP per MD order

## 2022-04-03 NOTE — Progress Notes (Signed)
PROGRESS NOTE Maria Osborn  WYO:[id number] DOB: [birth year] DOA: 04/02/2022 PCP: [facility]   Brief Narrative/Hospital Course: 37 y.o.F /hypertension, uterine fibroids, and iron deficiency anemia, presented with shortness of breath leg swelling progressively worsening for several days.  Complain of dyspnea on exertion.  Reports she recently had flu and bronchitis,, previously no history of congestive heart failure or cardiac disease.  Seen in the ED saturating well on room air with tachypnea and tachycardia elevated blood pressure EKG with sinus tachycardia chest x-ray for cardiomegaly airway thickening blood work showed hypokalemia elevated creatinine elevated BNP, positive troponin anemia Per ED physician found to have reduced EF of 30% on bedside echo cardiology consulted Lasix given along with potassium supplement and admitted    Subjective: Seen this am C/o shortness of breath but better than yesterday On [place] o2 at 2l No chest pain Has been having DOE, unable to clim up the stairs Less are less tight but still swollen    Assessment and Plan: Principal Problem:   Acute systolic CHF (congestive heart failure) (HCC) Active Problems:   Hypertensive urgency   Hypokalemia   Microcytic anemia   Elevated troponin   AKI (acute kidney injury) ([place])   Hypomagnesemia   Acute systolic congestive heart failure: Echo shows EF 20-25% new diagnosis.Symptomatic with dyspnea on exertion shortness of breath edema.  Patient endorses flu symptoms bronchitis recently question viral cardiomyopathy versus hypertensive cardiomyopathy, troponin elevated but not consistent with ACS. Cardiology has been consulted plan for RHC after diuresis +/- LHC.  Continue diuretics, GDMT per cardiology.  Monitor intake, output, daily weight, renal function closely. Net IO Since Admission: -893.6 mL [04/03/22 1307]   Hypertensive emergency: presenting with 200/133 blood pressure.  On  nitroglycerin drip for blood pressure control.  On Lasix.  Defer to cardiology for further workup-but given hyperkalemia may need to rule out adrenal causes..  Left pleural effusion moderate on the echo and chest x-ray: Likely from 1.  Continue diuresis if no improvement may need thoracentesis  Renal insufficiency unclear acute versus chronic:Previous creatinine 2020 0.7, on admission 2.6. ?  Hypertensive nephropathy.  Check renal ultrasound, UA for protein, monitor closely while on diuresis monitor urine output. Recent Labs    04/02/22 1546 04/03/22 0508  BUN 24* 23*  CREATININE 2.62* 2.57*   Iron-deficiency anemia with history of vaginal bleeding/uterine fibroids:hemoglobin 9.8, ferritin 20 saturation 12% consider Feraheme IV after cardiac MRI.  Elevated troponin suspect demand ischemia in the setting of congestive heart failure.  Flat.  Per cardiology  Hypokalemia Hypomagnesemia: In the setting of diuresis.Replating electrolytes aggressively and monitoring. Recent Labs  Lab 04/02/22 1546 04/02/22 2040 04/03/22 0508  K 2.6*  --  2.8*  CALCIUM 8.5*  --  8.6*  MG  --  1.6* 2.1    Class II Obesity:Patient's Body mass index is 39.27 kg/m. : Will benefit with PCP follow-up, weight loss  healthy lifestyle and outpatient sleep evaluation.   DVT prophylaxis: heparin injection 5,000 Units Start: 04/02/22 2200 Code Status:   Code Status: Full Code Family Communication: plan of care discussed with patient at bedside. Patient status is: Inpatient because of acute systolic congestive heart failure Level of care: Progressive   Dispo: The patient is from: home. Works in [employer], educational mostly secondary            Anticipated disposition: Home >2 days  Objective: Vitals last 24 hrs: Vitals:   04/03/22 1115 04/03/22 1130 04/03/22 1145 04/03/22 1215  BP: (!) 147/99 Marland Kitchen)  158/96 (!) 165/102 (!) 155/98  Pulse: (!) 102 (!) 103 (!) 108 (!) 109  Resp: (!) '25 19 16 '$ (!) 28  Temp:       TempSrc:      SpO2: 100% 100% 100% 100%  Weight:      Height:       Weight change:   Physical Examination: General exam: alert awake, older than stated age HEENT:Oral mucosa moist, Ear/Nose WNL grossly Respiratory system: bilaterally dimini BS, no use of accessory muscle Cardiovascular system: S1 & S2 +, No JVD. Gastrointestinal mostly sitting soft,NT,ND, BS+ Nervous System:Alert, awake, moving extremities. Extremities: LE edema +,distal peripheral pulses palpable.  Skin: No rashes,no icterus. MSK: Normal muscle bulk,tone, power  Medications reviewed:  Scheduled Meds:  furosemide  40 mg Intravenous Q12H   heparin  5,000 Units Subcutaneous Q8H   potassium chloride  40 mEq Oral Once   Continuous Infusions:  nitroGLYCERIN 75 mcg/min (04/03/22 1254)   potassium chloride        Diet Order     None                Intake/Output Summary (Last 24 hours) at 04/03/2022 1306 Last data filed at 04/03/2022 0708 Gross per 24 hour  Intake 306.4 ml  Output 1200 ml  Net -893.6 ml   Net IO Since Admission: -893.6 mL [04/03/22 1306]  Wt Readings from Last 3 Encounters:  04/02/22 107 kg  01/21/19 122 kg  01/12/19 121.1 kg     Unresulted Labs (From admission, onward)     Start     Ordered   04/03/22 1700  Potassium  Once-Timed,   TIMED        04/03/22 1148   04/03/22 3545  Basic metabolic panel  Daily,   R      04/03/22 0050   04/03/22 0500  Magnesium  Daily,   R      04/03/22 0050   04/03/22 0500  CBC  Daily,   R      04/03/22 0050          Data Reviewed: I have personally reviewed following labs and imaging studies CBC: Recent Labs  Lab 04/02/22 1546 04/03/22 0508  WBC 9.2 10.0  HGB 9.8* 10.3*  HCT 31.1* 32.8*  MCV 70.7* 71.0*  PLT 235 625   Basic Metabolic Panel: Recent Labs  Lab 04/02/22 1546 04/02/22 2040 04/03/22 0508  NA 138  --  137  K 2.6*  --  2.8*  CL 102  --  101  CO2 25  --  24  GLUCOSE 87  --  106*  BUN 24*  --  23*  CREATININE  2.62*  --  2.57*  CALCIUM 8.5*  --  8.6*  MG  --  1.6* 2.1   GFR: Estimated Creatinine Clearance: 37.1 mL/min (A) (by C-G formula based on SCr of 2.57 mg/dL (H)). Liver Function Tests: Recent Labs  Lab 04/02/22 2040  AST 19  ALT 17  ALKPHOS 65  BILITOT 1.2  PROT 6.6  ALBUMIN 2.9*  No results found for this or any previous visit (from the past 240 hour(s)).  Antimicrobials: Anti-infectives (From admission, onward)    None      Culture/Microbiology No results found for: "SDES", "SPECREQUEST", "CULT", "REPTSTATUS"  Radiology Studies: ECHOCARDIOGRAM COMPLETE  Result Date: 04/03/2022    ECHOCARDIOGRAM REPORT   Patient Name:   Maria Osborn Date of Exam: 04/03/2022 Medical Rec #:  [id number]  Height:       65.0 in Accession #:    [id number]             Weight:       236.0 lb Date of Birth:  Jul 06, 1985             BSA:          2.122 m Patient Age:    36 years               BP:           144/91 mmHg Patient Gender: F                      HR:           104 bpm. Exam Location:  Inpatient Procedure: 2D Echo, Color Doppler, Cardiac Doppler and Intracardiac            Opacification Agent STAT ECHO Indications:    CHF - Acute Systolic  History:        Patient has no prior history of Echocardiogram examinations.                 Signs/Symptoms:Shortness of Breath, Dyspnea, Orthopnea and                 Edema; Risk Factors:Hypertension. Iron deficiency anemia.  Sonographer:    Eartha Inch Referring Phys: [id number] TIMOTHY S OPYD  Sonographer Comments: Image acquisition challenging due to patient body habitus and Image acquisition challenging due to respiratory motion. IMPRESSIONS 1. The left ventricle is mildly dilated with global hypokinesis and severely reduced systolic function. Estimated LV ejection fraction is 20-25%. 2. The RV appears normal in size and function. 3. There is mild mitral and tricuspid regurgitation. 4. There is a moderate to large left pleural effusion. FINDINGS   Left Ventricle: Left ventricular ejection fraction, by estimation, is <20%. The left ventricle has severely decreased function. The left ventricle demonstrates global hypokinesis. Definity contrast agent was given IV to delineate the left ventricular endocardial borders. The left ventricular internal cavity size was normal in size. There is no left ventricular hypertrophy. Left ventricular diastolic parameters are consistent with Grade I diastolic dysfunction (impaired relaxation). Right Ventricle: The right ventricular size is normal. No increase in right ventricular wall thickness. Right ventricular systolic function is normal. Left Atrium: Left atrial size was normal in size. Right Atrium: Right atrial size was normal in size. Pericardium: There is no evidence of pericardial effusion. Mitral Valve: The mitral valve is normal in structure. Mild mitral valve regurgitation. No evidence of mitral valve stenosis. MV peak gradient, 4.1 mmHg. The mean mitral valve gradient is 2.0 mmHg. Tricuspid Valve: The tricuspid valve is normal in structure. Tricuspid valve regurgitation is not demonstrated. No evidence of tricuspid stenosis. Aortic Valve: The aortic valve is normal in structure. Aortic valve regurgitation is not visualized. No aortic stenosis is present. Pulmonic Valve: The pulmonic valve was normal in structure. Pulmonic valve regurgitation is trivial. No evidence of pulmonic stenosis. Aorta: The aortic root is normal in size and structure. Venous: The inferior vena cava is normal in size with greater than 50% respiratory variability, suggesting right atrial pressure of 3 mmHg. IAS/Shunts: No atrial level shunt detected by color flow Doppler. Additional Comments: There is a large pleural effusion in the left lateral region.  LEFT VENTRICLE PLAX 2D LVIDd:         5.90 cm      Diastology LVIDs:  5.10 cm      LV e' medial:  6.73 cm/s LV PW:         1.10 cm      LV e' lateral: 6.08 cm/s LV IVS:        0.90 cm  LVOT diam:     2.10 cm LV SV:         46 LV SV Index:   22 LVOT Area:     3.46 cm  LV Volumes (MOD) LV vol d, MOD A2C: 141.0 ml LV vol d, MOD A4C: 145.0 ml LV vol s, MOD A2C: 89.5 ml LV vol s, MOD A4C: 109.0 ml LV SV MOD A2C:     51.5 ml LV SV MOD A4C:     145.0 ml LV SV MOD BP:      42.0 ml RIGHT VENTRICLE            IVC RV S prime:     8.58 cm/s  IVC diam: 1.90 cm TAPSE (M-mode): 1.1 cm LEFT ATRIUM             Index        RIGHT ATRIUM           Index LA diam:        4.60 cm 2.17 cm/m   RA Area:     10.50 cm LA Vol (A2C):   37.3 ml 17.58 ml/m  RA Volume:   18.20 ml  8.58 ml/m LA Vol (A4C):   71.4 ml 33.64 ml/m LA Biplane Vol: 56.0 ml 26.39 ml/m  AORTIC VALVE LVOT Vmax:   92.70 cm/s LVOT Vmean:  65.000 cm/s LVOT VTI:    0.132 m  AORTA Ao Root diam: 3.10 cm Ao Asc diam:  3.40 cm MITRAL VALVE MV Area VTI:  1.67 cm    SHUNTS MV Peak grad: 4.1 mmHg    Systemic VTI:  0.13 m MV Mean grad: 2.0 mmHg    Systemic Diam: 2.10 cm MV Vmax:      1.01 m/s MV Vmean:     59.0 cm/s MR Peak grad: 52.8 mmHg MR Mean grad: 75.0 mmHg MR Vmax:      363.33 cm/s MR Vmean:     408.0 cm/s Aditya Sabharwal Electronically signed by Hebert Soho Signature Date/Time: 04/03/2022/9:52:25 AM    Final    DG Chest 2 View  Result Date: 04/02/2022 CLINICAL DATA:  Shortness of breath, cough, and chest pain. EXAM: CHEST - 2 VIEW COMPARISON:  None Available. FINDINGS: The cardiac silhouette is mildly to moderately enlarged. The lungs are hypoinflated. There is moderate airway thickening. No focal airspace consolidation, overt pulmonary edema, sizable pleural effusion, or pneumothorax is identified. No acute osseous abnormality is seen. IMPRESSION: 1. Airway thickening which may reflect bronchitis. 2. Cardiomegaly. Electronically Signed   By: Logan Bores M.D.   On: 04/02/2022 16:18     LOS: 1 day   Antonieta Pert, MD [facility]  04/03/2022, 1:06 PM

## 2022-04-03 NOTE — Progress Notes (Signed)
Two potassium dose are showing on the MAR. Patient stated she received those doses in ED before coming to floor.

## 2022-04-03 NOTE — ED Notes (Signed)
Pt reports improvement with BIPAP

## 2022-04-03 NOTE — ED Notes (Incomplete)
Pt reports feeling increasingly uncomfortable, unable to complete sentences due to SOB, repositioned and [employer]

## 2022-04-03 NOTE — Progress Notes (Signed)
  Echocardiogram 2D Echocardiogram has been performed.  Maria Osborn 04/03/2022, 9:06 AM

## 2022-04-03 NOTE — ED Notes (Signed)
Pt work of breathing improved, requesting break from BIPAP. BIPAP removed, 2LNC applied for comfort

## 2022-04-03 NOTE — ED Notes (Signed)
Pt reporting increased discomfort due to SOB, unable to speak in full sentences, sitting on side of bed in tripod position. SPO2 100% RA, 2LNC applied for comfort with no relief. Opyd MD made aware, agreed that pt would benefit from BIPAP.

## 2022-04-03 NOTE — Consult Note (Addendum)
Advanced Heart Failure Team Consult Note   Primary Physician: [facility] PCP-Cardiologist:  None  Reason for Consultation: acute systolic heart failure  HPI:    Maria Osborn is seen today for evaluation of acute systolic heart failure at the request of Dr. Lupita Leash, Internal Medicine.  37 y/o female w/ h/o HTN, obesity, chronic IDA and uterine fibroids. No prior cardiac history. 2 prior pregnancies. Last delivery was in 2021. Reports she had hypertension during pregnancy and treated w/ meds that continued up to 3 months postpartum, then stopped as she reports BP was better controlled.   + recent history of Flu this past December. Since then, has struggled w/ persistent cough. Received treatment for suspected "bronchitis" w/o improvement. Over the last 1 wk, she developed exertional dyspnea, orthopnea/PND, LEE and pleuritic CP. Symptoms progressively worsened, prompting her to come to ED.   In ED, found to be markedly hypertensive, BP 200/133 and in acute HF. BNP 2,272. CXR w/ cardiomegaly but no overt edema.   Also w/ renal insuffiencey and hypokalemia SCr elevated at 2.6 (last b/l SCr was in 2020 and was normal, 0.76). K 2.6 but no recent large GI losses. Mg 1.6. Hs trop 87>>86. Hgb 9.8. Iron studies c/w IDA, Ferritin 20, Tsat 12%.   She has been started on IV Lasix, NGT Gtt and IV KCl.   2D Echo done and shows severely reduced LVEF 20-25%, global HK, no LVH, RV normal. + mod-large left pleural effusion. IVC not dilated.     Echo 04/03/22 1. The left ventricle is mildly dilated with global hypokinesis and  severely reduced systolic function. Estimated LV ejection fraction is  20-25%.  2. The RV appears normal in size and function.  3. There is mild mitral and tricuspid regurgitation.  4. There is a moderate to large left pleural effusion.  FINDINGS   Left Ventricle: Left ventricular ejection fraction, by estimation, is  <20%. The left ventricle has  severely decreased function. The left  ventricle demonstrates global hypokinesis. Definity contrast agent was  given IV to delineate the left ventricular  endocardial borders. The left ventricular internal cavity size was normal  in size. There is no left ventricular hypertrophy. Left ventricular  diastolic parameters are consistent with Grade I diastolic dysfunction  (impaired relaxation).   Right Ventricle: The right ventricular size is normal. No increase in  right ventricular wall thickness. Right ventricular systolic function is  normal.   Venous: The inferior vena cava is normal in size with greater than 50%  respiratory variability, suggesting right atrial pressure of 3 mmHg.    Review of Systems: [y] = yes, '[ ]'$  = no   General: Weight gain '[ ]'$ ; Weight loss '[ ]'$ ; Anorexia '[ ]'$ ; Fatigue '[ ]'$ ; Fever '[ ]'$ ; Chills '[ ]'$ ; Weakness '[ ]'$   Cardiac: Chest pain/pressure [Y ]; Resting SOB [ Y]; Exertional SOB [Y ]; Orthopnea [Y ]; Pedal Edema [Y ]; Palpitations '[ ]'$ ; Syncope '[ ]'$ ; Presyncope '[ ]'$ ; Paroxysmal nocturnal dyspnea[Y ]  Pulmonary: Cough '[ ]'$ ; Wheezing'[ ]'$ ; Hemoptysis'[ ]'$ ; Sputum '[ ]'$ ; Snoring '[ ]'$   GI: Vomiting'[ ]'$ ; Dysphagia'[ ]'$ ; Melena'[ ]'$ ; Hematochezia '[ ]'$ ; Heartburn'[ ]'$ ; Abdominal pain '[ ]'$ ; Constipation '[ ]'$ ; Diarrhea '[ ]'$ ; BRBPR '[ ]'$   GU: Hematuria'[ ]'$ ; Dysuria '[ ]'$ ; Nocturia'[ ]'$   Vascular: Pain in legs with walking '[ ]'$ ; Pain in feet with lying flat '[ ]'$ ; Non-healing sores '[ ]'$ ; Stroke '[ ]'$ ; TIA '[ ]'$ ; Slurred speech '[ ]'$ ;  Neuro: Headaches'[ ]'$ ; Vertigo'[ ]'$ ; Seizures'[ ]'$ ; Paresthesias'[ ]'$ ;Blurred vision '[ ]'$ ; Diplopia '[ ]'$ ; Vision changes '[ ]'$   Ortho/Skin: Arthritis '[ ]'$ ; Joint pain '[ ]'$ ; Muscle pain '[ ]'$ ; Joint swelling '[ ]'$ ; Back Pain '[ ]'$ ; Rash '[ ]'$   Psych: Depression'[ ]'$ ; Anxiety'[ ]'$   Heme: Bleeding problems '[ ]'$ ; Clotting disorders '[ ]'$ ; Anemia '[ ]'$   Endocrine: Diabetes '[ ]'$ ; Thyroid dysfunction'[ ]'$   Home Medications Prior to Admission medications   Medication Sig Start Date End Date Taking? Authorizing Provider   albuterol (VENTOLIN HFA) 108 (90 Base) MCG/ACT inhaler Inhale 1 puff into the lungs every 6 (six) hours as needed for wheezing or shortness of breath.   Yes [provider]  fexofenadine (ALLEGRA) 180 MG tablet Take 180 mg by mouth daily as needed for allergies or rhinitis.   Yes [provider]  fluticasone (FLONASE) 50 MCG/ACT nasal spray Place 1 spray into both nostrils daily as needed for allergies or rhinitis.   Yes [provider]  HYDROcodone bit-homatropine (HYDROMET) 5-1.5 MG/5ML syrup Take 5 mLs by mouth every 6 (six) hours as needed for cough.   Yes [provider]  promethazine (PHENERGAN) 25 MG tablet Take 25 mg by mouth every 6 (six) hours as needed for nausea or vomiting.   Yes [provider]  acetaminophen (TYLENOL) 500 MG tablet Take 2 tablets (1,000 mg total) by mouth every 6 (six) hours. Patient not taking: Reported on 04/02/2022 01/23/19   Azucena Fallen, MD  labetalol (NORMODYNE) 100 MG tablet Take 1 tablet (100 mg total) by mouth 3 (three) times daily. Patient not taking: Reported on 04/02/2022 01/23/19   Azucena Fallen, MD    Past Medical History: Past Medical History:  Diagnosis Date   Anemia    Fibroid    Fibroids    Headache     Past Surgical History: Past Surgical History:  Procedure Laterality Date   CESAREAN SECTION N/A 12/26/2017   Procedure: CESAREAN SECTION;  Surgeon: Linda Hedges, DO;  Location: [facility];  Service: Obstetrics;  Laterality: N/A;   CESAREAN SECTION N/A 01/21/2019   Procedure: CESAREAN SECTION;  Surgeon: Azucena Fallen, MD;  Location: [facility];  Service: Obstetrics;  Laterality: N/A;   DILATION AND CURETTAGE OF UTERUS     HYSTEROSCOPY WITH D & C N/A 06/10/2016   Procedure: DILATATION & CURETTAGE/HYSTEROSCOPY;  Surgeon: Linda Hedges, DO;  Location: [facility];  Service: Gynecology;  Laterality: N/A;   MYOMECTOMY N/A 06/10/2016   Procedure: MYOMECTOMY, abdominal;  Surgeon: Linda Hedges, DO;   Location: [facility];  Service: Gynecology;  Laterality: N/A;    Family History: Family History  Problem Relation Age of Onset   Migraines Father    Stroke Mother    Depression Mother    Lupus Maternal Aunt    Stroke Maternal Uncle     Social History: Social History   Socioeconomic History   Marital status: Married    Spouse name: Not on file   Number of children: 0   Years of education: 16   Highest education level: Not on file  Occupational History   Occupation: [employer]  Tobacco Use   Smoking status: Never   Smokeless tobacco: Never  Substance and Sexual Activity   Alcohol use: Yes    Alcohol/week: 0.0 standard drinks of alcohol    Comment: Socially   Drug use: No   Sexual activity: Yes    Birth control/protection: None  Other Topics Concern   Not on file  Social History Narrative   Lives at home with boyfriend   Caffeine use: Drinks tea (4 cups per week)   No soda/coffee   Social Determinants of Health   Financial Resource Strain: Low Risk  (01/21/2019)   Overall Financial Resource Strain (CARDIA)    Difficulty of Paying Living Expenses: Not hard at all  Food Insecurity: No Food Insecurity (01/21/2019)   Hunger Vital Sign    Worried About Running Out of Food in the Last Year: Never true    Ran Out of Food in the Last Year: Never true  Transportation Needs: No Transportation Needs (01/21/2019)   PRAPARE - Hydrologist (Medical): No    Lack of Transportation (Non-Medical): No  Physical Activity: Insufficiently Active (01/21/2019)   Exercise Vital Sign    Days of Exercise per Week: 3 days    Minutes of Exercise per Session: 30 min  Stress: No Stress Concern Present (01/21/2019)   [place]    Feeling of Stress : Not at all  Social Connections: Moderately Integrated (01/21/2019)   Social Connection and Isolation Panel [NHANES]    Frequency of  Communication with Friends and Family: More than three times a week    Frequency of Social Gatherings with Friends and Family: More than three times a week    Attends Religious Services: More than 4 times per year    Active Member of [employer] or Organizations: No    Attends Archivist Meetings: Never    Marital Status: Married    Allergies:  Allergies  Allergen Reactions   Amoxicillin Other (See Comments)    Digestive issues    Objective:    Vital Signs:   Temp:  [97.5 F (36.4 C)-98.3 F (36.8 C)] 97.5 F (36.4 C) (02/01 1037) Pulse Rate:  [95-126] 108 (02/01 1145) Resp:  [16-37] 16 (02/01 1145) BP: (138-200)/(91-138) 165/102 (02/01 1145) SpO2:  [94 %-100 %] 100 % (02/01 1145) FiO2 (%):  [40 %] 40 % (02/01 0028) Weight:  [107 kg] 107 kg (01/31 1542)    Weight change: Filed Weights   04/02/22 1542  Weight: 107 kg    Intake/Output:   Intake/Output Summary (Last 24 hours) at 04/03/2022 1159 Last data filed at 04/03/2022 0708 Gross per 24 hour  Intake 306.4 ml  Output 1200 ml  Net -893.6 ml      Physical Exam    General:  Well appearing. No resp difficulty HEENT: normal Neck: supple. JVP 12 cm . Carotids 2+ bilat; no bruits. No lymphadenopathy or thyromegaly appreciated. Cor: PMI nondisplaced. Regular rhythm and tachy rate. No rubs, gallops or murmurs. Lungs: clear Abdomen: obese, soft, nontender, nondistended. No hepatosplenomegaly. No bruits or masses. Good bowel sounds. Extremities: no cyanosis, clubbing, rash, edema Neuro: alert & orientedx3, cranial nerves grossly intact. moves all 4 extremities w/o difficulty. Affect pleasant   Telemetry   Sinus tach low 100s   EKG    Sinus tach 108 bpm, nonspecific ST and Tw abnormalities   Labs   Basic Metabolic Panel: Recent Labs  Lab 04/02/22 1546 04/02/22 2040 04/03/22 0508  NA 138  --  137  K 2.6*  --  2.8*  CL 102  --  101  CO2 25  --  24  GLUCOSE 87  --  106*  BUN 24*  --  23*  CREATININE  2.62*  --  2.57*  CALCIUM 8.5*  --  8.6*  MG  --  1.6* 2.1    Liver Function Tests: Recent Labs  Lab 04/02/22 2040  AST 19  ALT 17  ALKPHOS 65  BILITOT 1.2  PROT 6.6  ALBUMIN 2.9*   No results for input(s): "LIPASE", "AMYLASE" in the last 168 hours. No results for input(s): "AMMONIA" in the last 168 hours.  CBC: Recent Labs  Lab 04/02/22 1546 04/03/22 0508  WBC 9.2 10.0  HGB 9.8* 10.3*  HCT 31.1* 32.8*  MCV 70.7* 71.0*  PLT 235 254    Cardiac Enzymes: Recent Labs  Lab 04/02/22 2249  CKTOTAL 201    BNP: BNP (last 3 results) Recent Labs    04/02/22 1630  BNP 2,272.6*    ProBNP (last 3 results) No results for input(s): "PROBNP" in the last 8760 hours.   CBG: No results for input(s): "GLUCAP" in the last 168 hours.  Coagulation Studies: No results for input(s): "LABPROT", "INR" in the last 72 hours.   Imaging   ECHOCARDIOGRAM COMPLETE  Result Date: 04/03/2022    ECHOCARDIOGRAM REPORT   Patient Name:   Maria Osborn Date of Exam: 04/03/2022 Medical Rec #:  [id number]              Height:       65.0 in Accession #:    [id number]             Weight:       236.0 lb Date of Birth:  [birth year]             BSA:          2.122 m Patient Age:    89 years               BP:           144/91 mmHg Patient Gender: F                      HR:           104 bpm. Exam Location:  Inpatient Procedure: 2D Echo, Color Doppler, Cardiac Doppler and Intracardiac            Opacification Agent STAT ECHO Indications:    CHF - Acute Systolic  History:        Patient has no prior history of Echocardiogram examinations.                 Signs/Symptoms:Shortness of Breath, Dyspnea, Orthopnea and                 Edema; Risk Factors:Hypertension. Iron deficiency anemia.  Sonographer:    Eartha Inch Referring Phys: [id number] TIMOTHY S OPYD  Sonographer Comments: Image acquisition challenging due to patient body habitus and Image acquisition challenging due to respiratory motion.  IMPRESSIONS 1. The left ventricle is mildly dilated with global hypokinesis and severely reduced systolic function. Estimated LV ejection fraction is 20-25%. 2. The RV appears normal in size and function. 3. There is mild mitral and tricuspid regurgitation. 4. There is a moderate to large left pleural effusion. FINDINGS  Left Ventricle: Left ventricular ejection fraction, by estimation, is <20%. The left ventricle has severely decreased function. The left ventricle demonstrates global hypokinesis. Definity contrast agent was given IV to delineate the left ventricular endocardial borders. The left ventricular internal cavity size was normal in size. There is no left ventricular hypertrophy. Left ventricular diastolic parameters are consistent with Grade I diastolic dysfunction (impaired relaxation). Right Ventricle: The right ventricular size is normal. No increase in  right ventricular wall thickness. Right ventricular systolic function is normal. Left Atrium: Left atrial size was normal in size. Right Atrium: Right atrial size was normal in size. Pericardium: There is no evidence of pericardial effusion. Mitral Valve: The mitral valve is normal in structure. Mild mitral valve regurgitation. No evidence of mitral valve stenosis. MV peak gradient, 4.1 mmHg. The mean mitral valve gradient is 2.0 mmHg. Tricuspid Valve: The tricuspid valve is normal in structure. Tricuspid valve regurgitation is not demonstrated. No evidence of tricuspid stenosis. Aortic Valve: The aortic valve is normal in structure. Aortic valve regurgitation is not visualized. No aortic stenosis is present. Pulmonic Valve: The pulmonic valve was normal in structure. Pulmonic valve regurgitation is trivial. No evidence of pulmonic stenosis. Aorta: The aortic root is normal in size and structure. Venous: The inferior vena cava is normal in size with greater than 50% respiratory variability, suggesting right atrial pressure of 3 mmHg. IAS/Shunts: No  atrial level shunt detected by color flow Doppler. Additional Comments: There is a large pleural effusion in the left lateral region.  LEFT VENTRICLE PLAX 2D LVIDd:         5.90 cm      Diastology LVIDs:         5.10 cm      LV e' medial:  6.73 cm/s LV PW:         1.10 cm      LV e' lateral: 6.08 cm/s LV IVS:        0.90 cm LVOT diam:     2.10 cm LV SV:         46 LV SV Index:   22 LVOT Area:     3.46 cm  LV Volumes (MOD) LV vol d, MOD A2C: 141.0 ml LV vol d, MOD A4C: 145.0 ml LV vol s, MOD A2C: 89.5 ml LV vol s, MOD A4C: 109.0 ml LV SV MOD A2C:     51.5 ml LV SV MOD A4C:     145.0 ml LV SV MOD BP:      42.0 ml RIGHT VENTRICLE            IVC RV S prime:     8.58 cm/s  IVC diam: 1.90 cm TAPSE (M-mode): 1.1 cm LEFT ATRIUM             Index        RIGHT ATRIUM           Index LA diam:        4.60 cm 2.17 cm/m   RA Area:     10.50 cm LA Vol (A2C):   37.3 ml 17.58 ml/m  RA Volume:   18.20 ml  8.58 ml/m LA Vol (A4C):   71.4 ml 33.64 ml/m LA Biplane Vol: 56.0 ml 26.39 ml/m  AORTIC VALVE LVOT Vmax:   92.70 cm/s LVOT Vmean:  65.000 cm/s LVOT VTI:    0.132 m  AORTA Ao Root diam: 3.10 cm Ao Asc diam:  3.40 cm MITRAL VALVE MV Area VTI:  1.67 cm    SHUNTS MV Peak grad: 4.1 mmHg    Systemic VTI:  0.13 m MV Mean grad: 2.0 mmHg    Systemic Diam: 2.10 cm MV Vmax:      1.01 m/s MV Vmean:     59.0 cm/s MR Peak grad: 52.8 mmHg MR Mean grad: 75.0 mmHg MR Vmax:      363.33 cm/s MR Vmean:     408.0 cm/s [employer] Electronically signed  by Hebert Soho Signature Date/Time: 04/03/2022/9:52:25 AM    Final    DG Chest 2 View  Result Date: 04/02/2022 CLINICAL DATA:  Shortness of breath, cough, and chest pain. EXAM: CHEST - 2 VIEW COMPARISON:  None Available. FINDINGS: The cardiac silhouette is mildly to moderately enlarged. The lungs are hypoinflated. There is moderate airway thickening. No focal airspace consolidation, overt pulmonary edema, sizable pleural effusion, or pneumothorax is identified. No acute osseous  abnormality is seen. IMPRESSION: 1. Airway thickening which may reflect bronchitis. 2. Cardiomegaly. Electronically Signed   By: Logan Bores M.D.   On: 04/02/2022 16:18     Medications:     Current Medications:  furosemide  40 mg Intravenous Q12H   heparin  5,000 Units Subcutaneous Q8H   potassium chloride  40 mEq Oral Once    Infusions:  nitroGLYCERIN 70 mcg/min (04/03/22 0944)   potassium chloride        Patient Profile   37 y/o female w/ h/o HTN, obesity, chronic IDA and uterine fibroids admitted w/ hypertensive emergency, acute systolic heart failure and renal insufficiency.   Assessment/Plan   1. Acute Systolic Heart Failure - New diagnosis. 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 No ischemic CP. HS trop not c/w ACS. No family h/o HF  - continue diuresis w/ IV Lasix + BP control w/ addition of GDMT - plan RHC after diuresis +/- LHC pending improvement in renal fx - cMRI after diuresis  - plan after load reduction w/ hydral + Imdur for now given renal fx  - additional GDMT w/ MRA/ARB/ARNi once renal fx improv - await to add low dose coreg until better diuresed   2. Hypertensive Emergency  - reports h/o gestational HTN but stopped meds 3 months postpartum w/ reported improved BP control  - BP 200/133 on admit  - associated unexplained hypokalemia raises concern for secondary hypertension, hyperaldosteronism.  - Check renin:aldosterone level prior to initiation of MRA and ARNi  - Check plasma metanephrines - Check renal US - on NTG gtt for BP control, will plan to transition to Bidil for now - additional GDMT w/ MRA/ARB/ARNi once renal fx improves  - outpatient sleep study to r/o OSA  3. Renal Insuffiencey  - Unsure if acute vs chronic, last SCr on file was from 2020 (SCr 0.79) - SCr 2.62 on admit - ? Hypertensive nephropathy  - consider Renal US, UA for protein analysis and nephrology consult  - follow BMP  closely w/ diuresis  - check Hgb A1c to r/o DM   4. IDA - h/o heavy vaginal bleeding w/ h/o uterine fibroids - Hgb 9.8  - Ferritin 20, Tsat 12% - needs feraheme but will hold for now as she may need cMRI  - PO Fe at d/c per primary team   5. Hypokalemia/Hypomagnesemia - K 2.6, Mg 1.6 - aggressive K and Mg Supp  - w/u to exclude hyperaldosteronism - consider addition of spiro pending improvement in SCr   6. Left Pleural Effusion  - mod size noted on 2D echo and CXR - currently on 3 L English. If no improvement in oxygenation w/ diuresis, may need thoracentesis. Will follow    Length of Stay: 1  Brittainy Simmons, PA-C  04/03/2022, 11:59 AM  Advanced Heart Failure Team Pager [phone] (M-F; 7a - 5p)  Please contact [facility] for night-coverage after hours (4p -7a ) and weekends on [email or web address]  Patient seen with PA,  agree with the above note.   History as above, admitted with hypertensive urgency and dyspnea, found to have cardiomyopathy on echo with EF 20-25%, normal RV.  This is a new diagnosis, no prior echoes.  She was also found to have elevated creatinine at 2.6 on admission.  Last BMET was 3 years ago.    General: NAD Neck: Thick, JVP 10-12, no thyromegaly or thyroid nodule.  Lungs: Clear to auscultation bilaterally with normal respiratory effort. CV: Nondisplaced PMI.  Heart regular S1/S2, no S3/S4, no murmur.  No peripheral edema.  No carotid bruit.  Normal pedal pulses.  Abdomen: Soft, nontender, no hepatosplenomegaly, no distention.  Skin: Intact without lesions or rashes.  Neurologic: Alert and oriented x 3.  Psych: Normal affect. Extremities: No clubbing or cyanosis.  HEENT: Normal.   1. Acute systolic CHF: New diagnosis of cardiomyopathy.  Echo this admission with EF 20-25%, normal RV, no significant valvular abnormalities.  Patient's BP has been markedly high, no BP meds at home and has not had much medical care.  Was hypertensive at last pregnancy in 2020.  No  family history of CMP, SCD, CAD. No ETOH/drugs.  She did have flu in 12/23 and another viral-type infection earlier this month.  Ddx is broad, hypertensive CMP versus viral myocarditis versus ?unrecognized peripartum CMP from her 2nd pregnancy in 2020. She looks volume overloaded on exam.  - Continue Lasix 40 mg IV bid and follow response.  - Would titrate off NTG gtt onto Bidil.  - Coreg when volume status is improved.   - GDMT will be limited by renal dysfunction.  - CAD unlikely, hold off on cath for now with elevated creatinine.  - Will arrange for cardiac MRI to assess for infiltrative disease, myocarditis, evidence of prior MI.  2. Hypertension: Admitted with hypertensive urgency.  BP remains elevated, on NTG gtt.  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  Concern for secondary hypertension, was hypokalemic prior to Lasix.  - Agree with assessment of plasma renin-aldosterone and plasma metanephrines.  - Renal artery dopplers to look for fibromuscular dysplasia.  - Starting Bidil and amlodipine now to try to wean off NTG gtt.  3. Elevated creatinine: ?Baseline, has been 3 years since last creatinine in system. Creatinine 2.6 at admission.  ?Hypertensive nephropathy.  4. Fe deficiency anemia: Will need Feraheme after MRI.  5. Left pleural effusion: Noted on echo but does not appear to be large on CXR.   Loralie Champagne 04/03/2022 5:41 PM

## 2022-04-04 ENCOUNTER — Other Ambulatory Visit (HOSPITAL_COMMUNITY): Payer: Self-pay

## 2022-04-04 ENCOUNTER — Inpatient Hospital Stay (HOSPITAL_COMMUNITY): Payer: 59

## 2022-04-04 DIAGNOSIS — I3139 Other pericardial effusion (noninflammatory): Secondary | ICD-10-CM | POA: Diagnosis not present

## 2022-04-04 DIAGNOSIS — I160 Hypertensive urgency: Secondary | ICD-10-CM | POA: Diagnosis not present

## 2022-04-04 DIAGNOSIS — I5021 Acute systolic (congestive) heart failure: Secondary | ICD-10-CM | POA: Diagnosis not present

## 2022-04-04 DIAGNOSIS — J90 Pleural effusion, not elsewhere classified: Secondary | ICD-10-CM | POA: Diagnosis not present

## 2022-04-04 LAB — MAGNESIUM: Magnesium: 1.5 mg/dL — ABNORMAL LOW (ref 1.7–2.4)

## 2022-04-04 LAB — BASIC METABOLIC PANEL
Anion gap: 13 (ref 5–15)
BUN: 21 mg/dL — ABNORMAL HIGH (ref 6–20)
CO2: 23 mmol/L (ref 22–32)
Calcium: 8.6 mg/dL — ABNORMAL LOW (ref 8.9–10.3)
Chloride: 100 mmol/L (ref 98–111)
Creatinine, Ser: 2.54 mg/dL — ABNORMAL HIGH (ref 0.44–1.00)
GFR, Estimated: 25 mL/min — ABNORMAL LOW (ref >60)
Glucose, Bld: 106 mg/dL — ABNORMAL HIGH (ref 70–99)
Potassium: 2.8 mmol/L — ABNORMAL LOW (ref 3.5–5.1)
Sodium: 136 mmol/L (ref 135–145)

## 2022-04-04 LAB — CBC
HCT: 30.2 % — ABNORMAL LOW (ref 36.0–46.0)
Hemoglobin: 9.7 g/dL — ABNORMAL LOW (ref 12.0–15.0)
MCH: 22.8 pg — ABNORMAL LOW (ref 26.0–34.0)
MCHC: 32.1 g/dL (ref 30.0–36.0)
MCV: 70.9 fL — ABNORMAL LOW (ref 80.0–100.0)
Platelets: 242 10*3/uL (ref 150–400)
RBC: 4.26 MIL/uL (ref 3.87–5.11)
RDW: 26 % — ABNORMAL HIGH (ref 11.5–15.5)
WBC: 12.7 10*3/uL — ABNORMAL HIGH (ref 4.0–10.5)
nRBC: 0.2 % (ref 0.0–0.2)

## 2022-04-04 LAB — LIPID PANEL
Cholesterol: 151 mg/dL (ref 0–200)
HDL: 29 mg/dL — ABNORMAL LOW (ref >40)
LDL Cholesterol: 101 mg/dL — ABNORMAL HIGH (ref 0–99)
Total CHOL/HDL Ratio: 5.2 RATIO
Triglycerides: 107 mg/dL (ref <150)
VLDL: 21 mg/dL (ref 0–40)

## 2022-04-04 LAB — HEMOGLOBIN A1C
Hgb A1c MFr Bld: 5.1 % (ref 4.8–5.6)
Mean Plasma Glucose: 99.67 mg/dL

## 2022-04-04 LAB — POTASSIUM: Potassium: 3.4 mmol/L — ABNORMAL LOW (ref 3.5–5.1)

## 2022-04-04 MED ORDER — LORAZEPAM 2 MG/ML IJ SOLN
0.5000 mg | Freq: Once | INTRAMUSCULAR | Status: AC
  Administered 2022-04-04: 0.5 mg via INTRAVENOUS
  Filled 2022-04-04: qty 1

## 2022-04-04 MED ORDER — POTASSIUM CHLORIDE CRYS ER 20 MEQ PO TBCR: 40.0000 meq | EXTENDED_RELEASE_TABLET | ORAL | Status: DC

## 2022-04-04 MED ORDER — ISOSORB DINITRATE-HYDRALAZINE 20-37.5 MG PO TABS: 1.5000 | ORAL_TABLET | Freq: Three times a day (TID) | ORAL | Status: DC

## 2022-04-04 MED ORDER — GADOBUTROL 1 MMOL/ML IV SOLN
10.0000 mL | Freq: Once | INTRAVENOUS | Status: AC | PRN
  Administered 2022-04-04: 10 mL via INTRAVENOUS

## 2022-04-04 MED ORDER — POTASSIUM CHLORIDE CRYS ER 20 MEQ PO TBCR
40.0000 meq | EXTENDED_RELEASE_TABLET | ORAL | Status: AC
  Administered 2022-04-04 (×3): 40 meq via ORAL
  Filled 2022-04-04 (×3): qty 2

## 2022-04-04 MED ORDER — POTASSIUM CHLORIDE CRYS ER 20 MEQ PO TBCR
20.0000 meq | EXTENDED_RELEASE_TABLET | Freq: Every day | ORAL | Status: DC
  Administered 2022-04-04 – 2022-04-05 (×2): 20 meq via ORAL
  Filled 2022-04-04 (×2): qty 1

## 2022-04-04 MED ORDER — ADULT MULTIVITAMIN W/MINERALS CH
1.0000 | ORAL_TABLET | Freq: Every day | ORAL | Status: DC
  Administered 2022-04-04 – 2022-04-08 (×5): 1 via ORAL
  Filled 2022-04-04 (×5): qty 1

## 2022-04-04 MED ORDER — CARVEDILOL 3.125 MG PO TABS
3.1250 mg | ORAL_TABLET | Freq: Two times a day (BID) | ORAL | Status: DC
  Administered 2022-04-04 – 2022-04-06 (×4): 3.125 mg via ORAL
  Filled 2022-04-04 (×4): qty 1

## 2022-04-04 MED ORDER — ENSURE ENLIVE PO LIQD
237.0000 mL | Freq: Two times a day (BID) | ORAL | Status: DC
  Administered 2022-04-05 – 2022-04-06 (×3): 237 mL via ORAL

## 2022-04-04 MED ORDER — ISOSORB DINITRATE-HYDRALAZINE 20-37.5 MG PO TABS
2.0000 | ORAL_TABLET | Freq: Three times a day (TID) | ORAL | Status: DC
  Administered 2022-04-04 – 2022-04-08 (×12): 2 via ORAL
  Filled 2022-04-04 (×13): qty 2

## 2022-04-04 MED ORDER — ATORVASTATIN CALCIUM 40 MG PO TABS
40.0000 mg | ORAL_TABLET | Freq: Every day | ORAL | Status: DC
  Administered 2022-04-04 – 2022-04-08 (×5): 40 mg via ORAL
  Filled 2022-04-04 (×5): qty 1

## 2022-04-04 MED ORDER — POTASSIUM CHLORIDE 10 MEQ/100ML IV SOLN: 10.0000 meq | INTRAVENOUS | Status: AC

## 2022-04-04 MED ORDER — MAGNESIUM SULFATE 2 GM/50ML IV SOLN
2.0000 g | Freq: Once | INTRAVENOUS | Status: AC
  Administered 2022-04-04: 2 g via INTRAVENOUS
  Filled 2022-04-04: qty 50

## 2022-04-04 NOTE — Progress Notes (Signed)
Initial Nutrition Assessment  DOCUMENTATION CODES:  Obesity unspecified  INTERVENTION:  Liberalize diet from a heart healthy/carb modified to a 2g sodium diet to provide wider variety of menu options to enhance nutritional adequacy Ensure Enlive po BID, each supplement provides 350 kcal and 20 grams of protein. MVI with minerals daily  NUTRITION DIAGNOSIS:  Inadequate oral intake related to acute illness as evidenced by per patient/family report.  GOAL:  Patient will meet greater than or equal to 90% of their needs  MONITOR:  PO intake, Supplement acceptance, Diet advancement, Weight trends, Labs, I & O's  REASON FOR ASSESSMENT:  Malnutrition Screening Tool    ASSESSMENT:  Pt admitted with SOB and LE edema d/t new diagnosis of acute systolic CHF. PMH significant for HTN, uterine fibroids, and iron deficiency anemia.   Cardiology plans for cMRI and RHC +/- LHC after diuresis. 2D Echo done and shows severely reduced LVEF 20-25%, global HK, no LVH, RV normal. + mod-large left pleural effusion.     Spoke with pt at bedside. She reports having had a significant decrease in her appetite and PO intake since December d/t having the flu and then bronchitis. She states at that time she was experiencing emesis as well. This finally started to improve about a week ago but now d/t new onset of HF, she endorses having a decreased appetite again.   Meal completions: 2/1: 50% lunch and dinner ([place] diet)  Pt endorses having about a 5 lb weight loss since December however reports that some of this is d/t diuresis. Unfortunately, there is no documented weights on file within the last year. Pt's current weight noted to be 103.1 kg today. Will continue to monitor trends throughout admission.   Medications: lasix '40mg'$  q12h, bidil 2 tablets TID  Labs: potassium 2.8 (repleting), BUN 21, Cr 2.54, Mg 1.5, GFR 25, Hgb 9.7  UOP: 3.8L x24 hours I/O's: -3.9L since admit  NUTRITION - FOCUSED PHYSICAL  EXAM:  Flowsheet Row Most Recent Value  Orbital Region No depletion  Upper Arm Region No depletion  Thoracic and Lumbar Region No depletion  Buccal Region No depletion  Temple Region No depletion  Clavicle Bone Region No depletion  Clavicle and Acromion Bone Region No depletion  Scapular Bone Region No depletion  Dorsal Hand No depletion  Patellar Region No depletion  Anterior Thigh Region No depletion  Posterior Calf Region No depletion  Edema (RD Assessment) None  Hair Reviewed  Eyes Reviewed  Mouth Reviewed  Skin Reviewed  Nails Reviewed      Diet Order:   Diet Order             Diet heart healthy/carb modified Room service appropriate? Yes; Fluid consistency: Thin  Diet effective now                  EDUCATION NEEDS:  Education needs have been addressed  Skin:  Skin Assessment: Reviewed RN Assessment  Last BM:  2/1  Height:  Ht Readings from Last 1 Encounters:  04/02/22 '5\' 5"'$  (1.651 m)    Weight:  Wt Readings from Last 1 Encounters:  04/04/22 103.1 kg    Ideal Body Weight:  56.8 kg  BMI:  Body mass index is 37.84 kg/m.  Estimated Nutritional Needs:   Kcal:  1700-1900  Protein:  85-100g  Fluid:  >/=1.7L  Clayborne Dana, RDN, LDN Clinical Nutrition

## 2022-04-04 NOTE — Progress Notes (Addendum)
Advanced Heart Failure Rounding Note  PCP-Cardiologist: None   Subjective:   Renal US yesterday: No hydronephrosis. Increased parenchymal echogenicity which can be seen in the setting of medical renal disease. Bilateral pleural effusions.  Renal artery duplex today with no stenosis  Cr. 2.62>2.57>2.54  Feels fine this morning. Denies CP/SOB. Able to sleep on her sides now. Able to ambulate in her room without significant SOB. Feels much improvement with diuresis.    Objective:   Weight Range: 103.1 kg Body mass index is 37.84 kg/m.   Vital Signs:   Temp:  [97.5 F (36.4 C)-98.8 F (37.1 C)] 98.3 F (36.8 C) (02/02 0806) Pulse Rate:  [102-125] 113 (02/02 0806) Resp:  [16-32] 16 (02/02 0806) BP: (147-174)/(94-114) 165/94 (02/02 0806) SpO2:  [98 %-100 %] 98 % (02/02 0806) Weight:  [103.1 kg] 103.1 kg (02/02 0438) Last BM Date : 04/03/22  Weight change: Filed Weights   04/02/22 1542 04/04/22 0438  Weight: 107 kg 103.1 kg    Intake/Output:   Intake/Output Summary (Last 24 hours) at 04/04/2022 0908 Last data filed at 04/04/2022 0442 Gross per 24 hour  Intake 360 ml  Output 2600 ml  Net -2240 ml      Physical Exam    General:  well appearing.  No respiratory difficulty HEENT: normal Neck: supple. JVD difficult to access, thick neck. Carotids 2+ bilat; no bruits. No lymphadenopathy or thyromegaly appreciated. Cor: PMI nondisplaced. Tachy rate & reg rhythm. No rubs, gallops or murmurs. Lungs: clear Abdomen: soft, nontender, nondistended. No hepatosplenomegaly. No bruits or masses. Good bowel sounds. Extremities: no cyanosis, clubbing, rash, +1 BLE edema  Neuro: alert & oriented x 3, cranial nerves grossly intact. moves all 4 extremities w/o difficulty. Affect pleasant.   Telemetry   ST 110s, 11 beat NSVT (Personally reviewed)    EKG    No new EKG to review  Labs    CBC Recent Labs    04/03/22 0508 04/04/22 0143  WBC 10.0 12.7*  HGB 10.3* 9.7*  HCT  32.8* 30.2*  MCV 71.0* 70.9*  PLT 254 161   Basic Metabolic Panel Recent Labs    04/03/22 0508 04/03/22 1717 04/04/22 0143  NA 137  --  136  K 2.8* 2.6* 2.8*  CL 101  --  100  CO2 24  --  23  GLUCOSE 106*  --  106*  BUN 23*  --  21*  CREATININE 2.57*  --  2.54*  CALCIUM 8.6*  --  8.6*  MG 2.1  --  1.5*   Liver Function Tests Recent Labs    04/02/22 2040  AST 19  ALT 17  ALKPHOS 65  BILITOT 1.2  PROT 6.6  ALBUMIN 2.9*   No results for input(s): "LIPASE", "AMYLASE" in the last 72 hours. Cardiac Enzymes Recent Labs    04/02/22 2249  CKTOTAL 201    BNP: BNP (last 3 results) Recent Labs    04/02/22 1630  BNP 2,272.6*    ProBNP (last 3 results) No results for input(s): "PROBNP" in the last 8760 hours.   D-Dimer No results for input(s): "DDIMER" in the last 72 hours. Hemoglobin A1C Recent Labs    04/04/22 0143  HGBA1C 5.1   Fasting Lipid Panel Recent Labs    04/04/22 0143  CHOL 151  HDL 29*  LDLCALC 101*  TRIG 107  CHOLHDL 5.2   Thyroid Function Tests Recent Labs    04/03/22 0508  TSH 1.722    Other results:  Imaging   US RENAL  Result Date: 04/03/2022 CLINICAL DATA:  Acute kidney injury EXAM: RENAL / URINARY TRACT ULTRASOUND COMPLETE COMPARISON:  None Available. FINDINGS: Right Kidney: Renal measurements: 10.3 x 4.4 x 4.6 cm = volume: 109.8 mL. Increased parenchymal echogenicity. No mass or hydronephrosis visualized. Left Kidney: Renal measurements: 9.8 x 6.9 x 5.8 cm = volume: 105.5 mL. Increased parenchymal echogenicity. No mass or hydronephrosis visualized. Bladder: Appears normal for degree of bladder distention. Other: Bilateral pleural effusions. IMPRESSION: 1. No hydronephrosis. 2. Increased parenchymal echogenicity which can be seen in the setting of medical renal disease. 3. Bilateral pleural effusions. Electronically Signed   By: Yetta Glassman M.D.   On: 04/03/2022 14:09    Medications:     Scheduled Medications:   amLODipine  5 mg Oral Daily   furosemide  40 mg Intravenous Q12H   heparin  5,000 Units Subcutaneous Q8H   isosorbide-hydrALAZINE  1 tablet Oral TID   potassium chloride  40 mEq Oral Q4H    Infusions:  magnesium sulfate bolus IVPB     potassium chloride      PRN Medications: acetaminophen, hydrALAZINE, zolpidem  Patient Profile  37 y/o female w/ h/o HTN, obesity, chronic IDA and uterine fibroids admitted w/ hypertensive emergency, acute systolic heart failure and renal insufficiency.   Assessment/Plan  1. Acute Systolic Heart Failure - New diagnosis. 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 No ischemic CP. HS trop not c/w ACS. No family h/o HF  - continue diuresis w/ IV Lasix + BP control w/ addition of GDMT - plan RHC after diuresis +/- LHC pending improvement in renal fx - cMRI after diuresis  - after load reduction with Bidil, may need to switch to hydral/imdur w/ high co-pay. - additional GDMT w/ MRA/ARB/ARNi once renal fx improv - start coreg 3.125 BID - strict I&O, daily weights   2. Hypertensive Emergency  - reports h/o gestational HTN but stopped meds 3 months postpartum w/ reported improved BP control  - BP 200/133 on admit  - associated unexplained hypokalemia raises concern for secondary hypertension, hyperaldosteronism.  - Check renin:aldosterone level prior to initiation of MRA and ARNi  - Check plasma metanephrines - renal US (-) - Now off NTG gtt. Now on Bidil 1 tab and 5 mg amlodipine. Will increase bidil to 1.5 tab. - Starting BB as above - additional GDMT w/ MRA/ARB/ARNi once renal fx improves  - outpatient sleep study to r/o OSA   3. Renal Insuffiencey  - Unsure if acute vs chronic, last SCr on file was from 2020 (SCr 0.79) - SCr 2.62 on admit, 2.54 today - ? Hypertensive nephropathy  - consider Renal US, UA for protein analysis and nephrology consult  - follow BMP closely w/ diuresis  - Hgb A1c  5.1  4. IDA - h/o heavy vaginal bleeding w/ h/o uterine fibroids - Hgb 9.7  - Ferritin 20, Tsat 12% - needs feraheme but will hold for now as she may need cMRI  - PO Fe at d/c per primary team    5. Hypokalemia/Hypomagnesemia - K 2.8, Mg 1.5 - aggressive K and Mg Supp  - w/u to exclude hyperaldosteronism - consider addition of spiro pending improvement in SCr    6. Left Pleural Effusion  - mod size noted on 2D echo and CXR - currently on 3 L [place]. If no improvement in oxygenation w/ diuresis, may need thoracentesis. Will follow  7. Hyperlipidemia - LDL 101, goal <55 - start statin  Length of Stay: Wallace, NP  04/04/2022, 9:08 AM  Advanced Heart Failure Team Pager [phone] (M-F; 7a - 5p)  Please contact [facility] for night-coverage after hours (5p -7a ) and weekends on [email or web address]  Patient seen with NP, agree with the above note.   Good diuresis yesterday, creatinine stable at 2.5.  BP remains elevated but trending down.    Breathing better.   General: NAD Neck: JVP 10-12 cm.  Diffusely enlarged thyroid.  Lungs: Clear to auscultation bilaterally with normal respiratory effort. CV: Nondisplaced PMI.  Heart regular S1/S2, no S3/S4, no murmur.  No peripheral edema.   Abdomen: Soft, nontender, no hepatosplenomegaly, no distention.  Skin: Intact without lesions or rashes.  Neurologic: Alert and oriented x 3.  Psych: Normal affect. Extremities: No clubbing or cyanosis.  HEENT: Normal.   1. Acute systolic CHF: New diagnosis of cardiomyopathy.  Echo this admission with EF 20-25%, normal RV, no significant valvular abnormalities.  Patient's BP has been markedly high, no BP meds at home and has not had much medical care.  Was hypertensive at last pregnancy in 2020.  No family history of CMP, SCD, CAD. No ETOH/drugs.  She did have flu in 12/23 and another viral-type infection earlier this month.  TSH normal.  Ddx is broad, hypertensive CMP versus viral myocarditis versus  ?unrecognized peripartum CMP from her 2nd pregnancy in 2020. She has diuresed well with IV Lasix but is still volume overloaded.  - Continue Lasix 40 mg IV bid  today and follow response.  - Increase Bidil to 2 tabs tid.  - Volume status improving, add Coreg 3.125 mg bid.   - GDMT will be limited by renal dysfunction.  - CAD unlikely, hold off on coronary angiography with elevated creatinine.  - Cardiac MRI today to assess for infiltrative disease, myocarditis, evidence of prior MI.  2. Hypertension: Admitted with hypertensive urgency.  BP remains elevated but coming down with meds.  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 Concern for secondary hypertension, was hypokalemic prior to Lasix.  Renal artery dopplers with no evidence for FMD and normal TSH.  - Pending plasma renin-aldosterone and plasma metanephrine levels.  - Continue amlodipine, increasing Bidil and adding Coreg.  3. Elevated creatinine: ?Baseline, has been 3 years since last creatinine in system. Creatinine 2.6 at admission, 2.54 today. ?Hypertensive nephropathy.  4. Fe deficiency anemia: Will need Feraheme after MRI.  5. Left pleural effusion: Noted on echo but does not appear to be large on CXR.   Loralie Champagne 04/04/2022 2:12 PM

## 2022-04-04 NOTE — Progress Notes (Signed)
Pt on room air and stable. Pt refuises BIPAP QHS at this time. Will continue to monitor

## 2022-04-04 NOTE — TOC Benefit Eligibility Note (Signed)
Patient Teacher, English as a foreign language completed.    The patient is currently admitted and upon discharge could be taking isosorbide-hydralazine (Bidil) 20-37.5 mg.  The current 30 day co-pay is $243.03 due to a $3,000.00 deductible.   The patient is insured through [employer] [place] [facility] [place] Patient Advocate Team Direct Number: [phone]  Fax: [phone]

## 2022-04-04 NOTE — Plan of Care (Signed)
  Problem: Education: Goal: Ability to demonstrate management of disease process will improve Outcome: Progressing Goal: Ability to verbalize understanding of medication therapies will improve Outcome: Progressing   Problem: Activity: Goal: Capacity to carry out activities will improve 04/04/2022 0329 by Maryan Puls, RN Outcome: Progressing 04/04/2022 0041 by Maryan Puls, RN Outcome: Progressing   Problem: Cardiac: Goal: Ability to achieve and maintain adequate cardiopulmonary perfusion will improve Outcome: Progressing   Problem: Education: Goal: Knowledge of General Education information will improve Description: Including pain rating scale, medication(s)/side effects and non-pharmacologic comfort measures 04/04/2022 0329 by Maryan Puls, RN Outcome: Progressing 04/04/2022 0041 by Maryan Puls, RN Outcome: Progressing   Problem: Health Behavior/Discharge Planning: Goal: Ability to manage health-related needs will improve 04/04/2022 0329 by Maryan Puls, RN Outcome: Progressing 04/04/2022 0041 by Maryan Puls, RN Outcome: Progressing

## 2022-04-04 NOTE — Progress Notes (Signed)
PROGRESS NOTE Maria Osborn  QQV:[id number] DOB: 24-Oct-1985 DOA: 04/02/2022 PCP: [facility]   Brief Narrative/Hospital Course: 37 y.o.F /hypertension, uterine fibroids, and iron deficiency anemia, presented with shortness of breath leg swelling progressively worsening for several days.  Complain of dyspnea on exertion.  Reports she recently had flu and bronchitis,, previously no history of congestive heart failure or cardiac disease.  Seen in the ED saturating well on room air with tachypnea and tachycardia elevated blood pressure EKG with sinus tachycardia chest x-ray for cardiomegaly airway thickening blood work showed hypokalemia elevated creatinine elevated BNP, positive troponin anemia Per ED physician found to have reduced EF of 30% on bedside echo cardiology consulted Lasix given along with potassium supplement and admitted    Subjective: Seen and examined this morning husband at the bedside. She reports she feels somewhat better in terms of breathing but he still feels congested, has been able to walk without getting significantly short of breath Overnight patient remains mildly tachycardic hypertensive, on room air  Labs remains low k at 2.8, creat 2.54, mag 1.5   Assessment and Plan: Principal Problem:   Acute systolic CHF (congestive heart failure) (HCC) Active Problems:   Hypertensive urgency   Hypokalemia   Microcytic anemia   Elevated troponin   AKI (acute kidney injury) ([place])   Hypomagnesemia   Acute systolic congestive heart failure: Echo shows EF 20-25% new diagnosis. Symptomatic with dyspnea on exertion shortness of breath edema.  Overall much improving now, patient endorses flu symptoms bronchitis recently question viral cardiomyopathy versus hypertensive cardiomyopathy, troponin elevated but not consistent with ACS. Cardiology has been consulted: Continue diuresis, noted plan for cardiac MRI, RHC after diuresis +/- LHC. GDMT per cardiology.   Will continue to monitor intake, output, daily weight, renal function and electrolytes closely> electrolytes being replaced aggressively. Net IO Since Admission: -3,933.6 mL [04/04/22 1139]   Filed Weights   04/02/22 1542 04/04/22 0438  Weight: 107 kg 103.1 kg    Hypertensive emergency: presented with 200/133 blood pressure.  On nitroglycerin drip for blood pressure control> weaned off on amlodipine 5 mg Lasix 40 twice daily, BiDil 20-30 7.5 3 times daily.per cardiology getting renal artery ultrasound/aldosterone renin activity metanephrine level   Left pleural effusion moderate on the echo and chest x-ray: Likely from 1.  Continue diuresis if no improvement may need thoracentesis  Renal insufficiency unclear acute versus chronic:Previous creatinine 2020 was 0.7, on admission 2.6.  Remains about the same?  Hypertensive nephropathy.  Renal ultrasound bilateral pleural effusion increased parenchymal echogenicity in the setting of medical renal disease, protein creatinine ratio elevated 3.2, follow-up renal artery duplex. Mmonitor closely while on diuresis.  If remains elevated/does not improve consider nephrology consultation  Recent Labs    04/02/22 1546 04/03/22 0508 04/04/22 0143  BUN 24* 23* 21*  CREATININE 2.62* 2.57* 2.54*   IDA with history of vaginal bleeding/uterine fibroids: hemoglobin 9.8, ferritin 20 saturation 12% consider Feraheme IV after cardiac MRI.  Elevated troponin suspect demand ischemia in the setting of congestive heart failure.  Flat.  Lipid.  LDL 101.  Per cardiology  Hypokalemia Hypomagnesemia: Patient has not tolerated any of the IV potassium so difficult to replace, being aggressively replaced with potassium chloride orally.  Recheck lab later today. ?  Midline for IV KCl Recent Labs  Lab 04/02/22 1546 04/02/22 2040 04/03/22 0508 04/03/22 1717 04/04/22 0143  K 2.6*  --  2.8* 2.6* 2.8*  CALCIUM 8.5*  --  8.6*  --  8.6*  MG  --  1.6* 2.1  --  1.5*     Class II Obesity:Patient's Body mass index is 37.84 kg/m. : Will benefit with PCP follow-up, weight loss  healthy lifestyle and outpatient sleep evaluation.   DVT prophylaxis: heparin injection 5,000 Units Start: 04/02/22 2200 Code Status:   Code Status: Full Code Family Communication: plan of care discussed with patient at bedside. Patient status is: Inpatient because of acute systolic congestive heart failure Level of care: Progressive   Dispo: The patient is from: home. Works in Psychologist, educational mostly secondary            Anticipated disposition: Home >2 days  Objective: Vitals last 24 hrs: Vitals:   04/03/22 2055 04/03/22 2351 04/04/22 0438 04/04/22 0806  BP:  (!) 174/97 (!) 162/98 (!) 165/94  Pulse: (!) 119  (!) 125 (!) 113  Resp: '16  16 16  '$ Temp: 98.8 F (37.1 C) 98.8 F (37.1 C) 98.8 F (37.1 C) 98.3 F (36.8 C)  TempSrc: Oral Oral Oral Oral  SpO2: 100%  98% 98%  Weight:   103.1 kg   Height:       Weight change: -3.901 kg  Physical Examination: General exam: alert awake oriented, pleasant HEENT:Oral mucosa moist, Ear/Nose WNL grossly Respiratory system: Bilaterally clear BS, no use of accessory muscle Cardiovascular system: S1 & S2 +, No JVD. Gastrointestinal system: Abdomen soft,NT,ND, BS+ Nervous System: Alert, awake, moving extremities, shefollows commands. Extremities: LE edema +,distal peripheral pulses palpable.  Skin: No rashes,no icterus. MSK: normal muscle bulk,tone, power  Medications reviewed:  Scheduled Meds:  amLODipine  5 mg Oral Daily   furosemide  40 mg Intravenous Q12H   heparin  5,000 Units Subcutaneous Q8H   isosorbide-hydrALAZINE  1 tablet Oral TID   potassium chloride  40 mEq Oral Q4H   Continuous Infusions:  magnesium sulfate bolus IVPB 2 g (04/04/22 1114)   potassium chloride      Diet Order             Diet NPO time specified Except for: Sips with Meds  Diet effective midnight                  Intake/Output  Summary (Last 24 hours) at 04/04/2022 1139 Last data filed at 04/04/2022 1114 Gross per 24 hour  Intake 360 ml  Output 3400 ml  Net -3040 ml   Net IO Since Admission: -3,933.6 mL [04/04/22 1139]  Wt Readings from Last 3 Encounters:  04/04/22 103.1 kg  01/21/19 122 kg  01/12/19 121.1 kg     Unresulted Labs (From admission, onward)     Start     Ordered   04/04/22 1800  Potassium  Once-Timed,   TIMED        04/04/22 0901   04/03/22 1323  Aldosterone + renin activity w/ ratio  Once,   R        04/03/22 1322   04/03/22 1323  Metanephrines, plasma  Once,   R        04/03/22 1322   04/03/22 4174  Basic metabolic panel  Daily,   R      04/03/22 0050   04/03/22 0500  Magnesium  Daily,   R      04/03/22 0050   04/03/22 0500  CBC  Daily,   R      04/03/22 0050          Data Reviewed: I have personally reviewed following labs and imaging studies CBC: Recent  Labs  Lab 04/02/22 1546 04/03/22 0508 04/04/22 0143  WBC 9.2 10.0 12.7*  HGB 9.8* 10.3* 9.7*  HCT 31.1* 32.8* 30.2*  MCV 70.7* 71.0* 70.9*  PLT 235 254 277   Basic Metabolic Panel: Recent Labs  Lab 04/02/22 1546 04/02/22 2040 04/03/22 0508 04/03/22 1717 04/04/22 0143  NA 138  --  137  --  136  K 2.6*  --  2.8* 2.6* 2.8*  CL 102  --  101  --  100  CO2 25  --  24  --  23  GLUCOSE 87  --  106*  --  106*  BUN 24*  --  23*  --  21*  CREATININE 2.62*  --  2.57*  --  2.54*  CALCIUM 8.5*  --  8.6*  --  8.6*  MG  --  1.6* 2.1  --  1.5*   GFR: Estimated Creatinine Clearance: 36.8 mL/min (A) (by C-G formula based on SCr of 2.54 mg/dL (H)). Liver Function Tests: Recent Labs  Lab 04/02/22 2040  AST 19  ALT 17  ALKPHOS 65  BILITOT 1.2  PROT 6.6  ALBUMIN 2.9*  No results found for this or any previous visit (from the past 240 hour(s)).  Antimicrobials: Anti-infectives (From admission, onward)    None      Culture/Microbiology No results found for: "SDES", "SPECREQUEST", "CULT", "REPTSTATUS"  Radiology  Studies: US RENAL  Result Date: 04/03/2022 CLINICAL DATA:  Acute kidney injury EXAM: RENAL / URINARY TRACT ULTRASOUND COMPLETE COMPARISON:  None Available. FINDINGS: Right Kidney: Renal measurements: 10.3 x 4.4 x 4.6 cm = volume: 109.8 mL. Increased parenchymal echogenicity. No mass or hydronephrosis visualized. Left Kidney: Renal measurements: 9.8 x 6.9 x 5.8 cm = volume: 105.5 mL. Increased parenchymal echogenicity. No mass or hydronephrosis visualized. Bladder: Appears normal for degree of bladder distention. Other: Bilateral pleural effusions. IMPRESSION: 1. No hydronephrosis. 2. Increased parenchymal echogenicity which can be seen in the setting of medical renal disease. 3. Bilateral pleural effusions. Electronically Signed   By: Yetta Glassman M.D.   On: 04/03/2022 14:09   ECHOCARDIOGRAM COMPLETE  Result Date: 04/03/2022    ECHOCARDIOGRAM REPORT   Patient Name:   Maria Osborn Date of Exam: 04/03/2022 Medical Rec #:  [id number]              Height:       65.0 in Accession #:    [id number]             Weight:       236.0 lb Date of Birth:  07-08-1985             BSA:          2.122 m Patient Age:    85 years               BP:           144/91 mmHg Patient Gender: F                      HR:           104 bpm. Exam Location:  Inpatient Procedure: 2D Echo, Color Doppler, Cardiac Doppler and Intracardiac            Opacification Agent STAT ECHO Indications:    CHF - Acute Systolic  History:        Patient has no prior history of Echocardiogram examinations.  Signs/Symptoms:Shortness of Breath, Dyspnea, Orthopnea and                 Edema; Risk Factors:Hypertension. Iron deficiency anemia.  Sonographer:    Eartha Inch Referring Phys: [id number] TIMOTHY S OPYD  Sonographer Comments: Image acquisition challenging due to patient body habitus and Image acquisition challenging due to respiratory motion. IMPRESSIONS 1. The left ventricle is mildly dilated with global hypokinesis and severely  reduced systolic function. Estimated LV ejection fraction is 20-25%. 2. The RV appears normal in size and function. 3. There is mild mitral and tricuspid regurgitation. 4. There is a moderate to large left pleural effusion. FINDINGS  Left Ventricle: Left ventricular ejection fraction, by estimation, is <20%. The left ventricle has severely decreased function. The left ventricle demonstrates global hypokinesis. Definity contrast agent was given IV to delineate the left ventricular endocardial borders. The left ventricular internal cavity size was normal in size. There is no left ventricular hypertrophy. Left ventricular diastolic parameters are consistent with Grade I diastolic dysfunction (impaired relaxation). Right Ventricle: The right ventricular size is normal. No increase in right ventricular wall thickness. Right ventricular systolic function is normal. Left Atrium: Left atrial size was normal in size. Right Atrium: Right atrial size was normal in size. Pericardium: There is no evidence of pericardial effusion. Mitral Valve: The mitral valve is normal in structure. Mild mitral valve regurgitation. No evidence of mitral valve stenosis. MV peak gradient, 4.1 mmHg. The mean mitral valve gradient is 2.0 mmHg. Tricuspid Valve: The tricuspid valve is normal in structure. Tricuspid valve regurgitation is not demonstrated. No evidence of tricuspid stenosis. Aortic Valve: The aortic valve is normal in structure. Aortic valve regurgitation is not visualized. No aortic stenosis is present. Pulmonic Valve: The pulmonic valve was normal in structure. Pulmonic valve regurgitation is trivial. No evidence of pulmonic stenosis. Aorta: The aortic root is normal in size and structure. Venous: The inferior vena cava is normal in size with greater than 50% respiratory variability, suggesting right atrial pressure of 3 mmHg. IAS/Shunts: No atrial level shunt detected by color flow Doppler. Additional Comments: There is a large  pleural effusion in the left lateral region.  LEFT VENTRICLE PLAX 2D LVIDd:         5.90 cm      Diastology LVIDs:         5.10 cm      LV e' medial:  6.73 cm/s LV PW:         1.10 cm      LV e' lateral: 6.08 cm/s LV IVS:        0.90 cm LVOT diam:     2.10 cm LV SV:         46 LV SV Index:   22 LVOT Area:     3.46 cm  LV Volumes (MOD) LV vol d, MOD A2C: 141.0 ml LV vol d, MOD A4C: 145.0 ml LV vol s, MOD A2C: 89.5 ml LV vol s, MOD A4C: 109.0 ml LV SV MOD A2C:     51.5 ml LV SV MOD A4C:     145.0 ml LV SV MOD BP:      42.0 ml RIGHT VENTRICLE            IVC RV S prime:     8.58 cm/s  IVC diam: 1.90 cm TAPSE (M-mode): 1.1 cm LEFT ATRIUM             Index        RIGHT ATRIUM  Index LA diam:        4.60 cm 2.17 cm/m   RA Area:     10.50 cm LA Vol (A2C):   37.3 ml 17.58 ml/m  RA Volume:   18.20 ml  8.58 ml/m LA Vol (A4C):   71.4 ml 33.64 ml/m LA Biplane Vol: 56.0 ml 26.39 ml/m  AORTIC VALVE LVOT Vmax:   92.70 cm/s LVOT Vmean:  65.000 cm/s LVOT VTI:    0.132 m  AORTA Ao Root diam: 3.10 cm Ao Asc diam:  3.40 cm MITRAL VALVE MV Area VTI:  1.67 cm    SHUNTS MV Peak grad: 4.1 mmHg    Systemic VTI:  0.13 m MV Mean grad: 2.0 mmHg    Systemic Diam: 2.10 cm MV Vmax:      1.01 m/s MV Vmean:     59.0 cm/s MR Peak grad: 52.8 mmHg MR Mean grad: 75.0 mmHg MR Vmax:      363.33 cm/s MR Vmean:     408.0 cm/s Aditya Sabharwal Electronically signed by Hebert Soho Signature Date/Time: 04/03/2022/9:52:25 AM    Final    DG Chest 2 View  Result Date: 04/02/2022 CLINICAL DATA:  Shortness of breath, cough, and chest pain. EXAM: CHEST - 2 VIEW COMPARISON:  None Available. FINDINGS: The cardiac silhouette is mildly to moderately enlarged. The lungs are hypoinflated. There is moderate airway thickening. No focal airspace consolidation, overt pulmonary edema, sizable pleural effusion, or pneumothorax is identified. No acute osseous abnormality is seen. IMPRESSION: 1. Airway thickening which may reflect bronchitis. 2.  Cardiomegaly. Electronically Signed   By: Logan Bores M.D.   On: 04/02/2022 16:18     LOS: 2 days   Antonieta Pert, MD [facility]  04/04/2022, 11:39 AM

## 2022-04-04 NOTE — TOC Initial Note (Signed)
Transition of Care [facility]) - Initial/Assessment Note    Patient Details  Name: Maria Osborn MRN: [id number] Date of Birth: [birth year]  [facility]) CM/SW Contact:    Erenest Rasher, RN Phone Number: [phone] 04/04/2022, 4:55 PM  Clinical Narrative:                 Patient has scale at home to do daily weight. Pt works and will need note for work. Will continue to follow for dc needs.   Expected Discharge Plan: Home/Self Care Barriers to Discharge: Continued Medical Work up   Patient Goals and CMS Choice Patient states their goals for this hospitalization and ongoing recovery are:: wants to recover          Expected Discharge Plan and Services   Discharge Planning Services: CM Consult   Living arrangements for the past 2 months: Single Family Home                                      Prior Living Arrangements/Services Living arrangements for the past 2 months: Single Family Home Lives with:: Spouse Patient language and need for interpreter reviewed:: Yes        Need for Family Participation in Patient Care: No (Comment) Care giver support system in place?: Yes (comment)   Criminal Activity/Legal Involvement Pertinent to Current Situation/Hospitalization: No - Comment as needed  Activities of Daily Living Home Assistive Devices/Equipment: None ADL Screening (condition at time of admission) Patient's cognitive ability adequate to safely complete daily activities?: Yes Is the patient deaf or have difficulty hearing?: No Does the patient have difficulty seeing, even when wearing glasses/contacts?: No Does the patient have difficulty concentrating, remembering, or making decisions?: No Patient able to express need for assistance with ADLs?: No Does the patient have difficulty dressing or bathing?: No Independently performs ADLs?: Yes (appropriate for developmental age) Does the patient have difficulty walking or climbing stairs?:  No Weakness of Legs: None Weakness of Arms/Hands: None  Permission Sought/Granted Permission sought to share information with : Case Manager, Family Supports Permission granted to share information with : Yes, Verbal Permission Granted  Share Information with NAME: Francine Graven     Permission granted to share info w Relationship: husband  Permission granted to share info w Contact Information: [phone]  Emotional Assessment           Psych Involvement: No (comment)  Admission diagnosis:  Hypokalemia [M57.8] Acute systolic congestive heart failure ([place]) [I50.21] Acute kidney injury ([place]) [I69.6] Acute systolic CHF (congestive heart failure) ([place]) [I50.21] Patient Active Problem List   Diagnosis Date Noted   Acute systolic CHF (congestive heart failure) ([place]) 04/02/2022   Hypokalemia 04/02/2022   Microcytic anemia 04/02/2022   Elevated troponin 04/02/2022   AKI (acute kidney injury) ([place]) 04/02/2022   Hypomagnesemia 04/02/2022   Gestational thrombocytopenia ([place]) 01/21/2019   Pregnancy with history of cesarean section, antepartum 01/21/2019   Postpartum care following cesarean delivery (11/20) 01/21/2019   Status post repeat low transverse cesarean section 01/21/2019   Delivery of pregnancy by cesarean section 01/21/2019   S/P cesarean section 12/27/2017   Hypertensive urgency 12/25/2017   S/P laparotomy 06/10/2016   Somnolence, daytime 04/09/2016   Chronic migraine w/o aura w/o status migrainosus, not intractable 02/05/2015   PCP:  [facility]:   [facility] [place] [place] [address]  [address] [place] [address] Phone: [phone] Fax: [phone]  [id number] [place] [address] [id number]: [phone]: [phone]    Social Determinants of Health  (SDOH) Social History: [place] No Food Insecurity (04/03/2022)  Housing: Low Risk  (04/03/2022)  Transportation Needs: No Transportation Needs (04/03/2022)  Utilities: Not At Risk (04/03/2022)  Financial Resource Strain: Low Risk  (01/21/2019)  Physical Activity: Insufficiently Active (01/21/2019)  Social Connections: Moderately Integrated (01/21/2019)  Stress: No Stress Concern Present (01/21/2019)  Tobacco Use: Low Risk  (04/02/2022)   SDOH Interventions:     Readmission Risk Interventions     No data to display

## 2022-04-04 NOTE — Progress Notes (Signed)
Renal artery duplex study completed.   Please see CV Proc for preliminary results.   Darlin Coco, RDMS, RVT

## 2022-04-04 NOTE — Plan of Care (Signed)
  Problem: Education: ?Goal: Ability to demonstrate management of disease process will improve ?Outcome: Progressing ?Goal: Ability to verbalize understanding of medication therapies will improve ?Outcome: Progressing ?  ?Problem: Activity: ?Goal: Capacity to carry out activities will improve ?Outcome: Progressing ?  ?Problem: Cardiac: ?Goal: Ability to achieve and maintain adequate cardiopulmonary perfusion will improve ?Outcome: Progressing ?  ?Problem: Education: ?Goal: Knowledge of General Education information will improve ?Description: Including pain rating scale, medication(s)/side effects and non-pharmacologic comfort measures ?Outcome: Progressing ?  ?

## 2022-04-05 DIAGNOSIS — I5021 Acute systolic (congestive) heart failure: Secondary | ICD-10-CM | POA: Diagnosis not present

## 2022-04-05 LAB — BASIC METABOLIC PANEL
Anion gap: 11 (ref 5–15)
BUN: 18 mg/dL (ref 6–20)
CO2: 23 mmol/L (ref 22–32)
Calcium: 8.6 mg/dL — ABNORMAL LOW (ref 8.9–10.3)
Chloride: 103 mmol/L (ref 98–111)
Creatinine, Ser: 2.46 mg/dL — ABNORMAL HIGH (ref 0.44–1.00)
GFR, Estimated: 26 mL/min — ABNORMAL LOW (ref >60)
Glucose, Bld: 98 mg/dL (ref 70–99)
Potassium: 3 mmol/L — ABNORMAL LOW (ref 3.5–5.1)
Sodium: 137 mmol/L (ref 135–145)

## 2022-04-05 LAB — CBC
HCT: 28.4 % — ABNORMAL LOW (ref 36.0–46.0)
Hemoglobin: 9.1 g/dL — ABNORMAL LOW (ref 12.0–15.0)
MCH: 22.8 pg — ABNORMAL LOW (ref 26.0–34.0)
MCHC: 32 g/dL (ref 30.0–36.0)
MCV: 71 fL — ABNORMAL LOW (ref 80.0–100.0)
Platelets: 233 10*3/uL (ref 150–400)
RBC: 4 MIL/uL (ref 3.87–5.11)
RDW: 26.2 % — ABNORMAL HIGH (ref 11.5–15.5)
WBC: 9.5 10*3/uL (ref 4.0–10.5)
nRBC: 0.2 % (ref 0.0–0.2)

## 2022-04-05 LAB — MAGNESIUM: Magnesium: 1.9 mg/dL (ref 1.7–2.4)

## 2022-04-05 MED ORDER — AMLODIPINE BESYLATE 10 MG PO TABS
10.0000 mg | ORAL_TABLET | Freq: Every day | ORAL | Status: DC
  Administered 2022-04-06 – 2022-04-08 (×3): 10 mg via ORAL
  Filled 2022-04-05 (×3): qty 1

## 2022-04-05 NOTE — Progress Notes (Addendum)
Advanced Heart Failure Rounding Note  PCP-Cardiologist: None   Subjective:   Renal US  No hydronephrosis. Increased parenchymal echogenicity which can be seen in the setting of medical renal disease. Bilateral pleural effusions.  Renal artery duplex today with no stenosis  Cr. 2.62>2.57>2.54 > 2.46  K 3.0  Diuresed well overnight. Weight down 9 pounds in 2 days.  Now slowing down. Denies CP or SOB.   SBP still in the 150 range  cMRI pending    Objective:   Weight Range: 103.1 kg Body mass index is 37.84 kg/m.   Vital Signs:   Temp:  [98.1 F (36.7 C)-98.6 F (37 C)] 98.1 F (36.7 C) (02/03 0834) Pulse Rate:  [103-112] 108 (02/03 0834) Resp:  [16-18] 18 (02/03 0834) BP: (139-165)/(68-95) 151/81 (02/03 0834) SpO2:  [97 %-100 %] 97 % (02/03 0834) Last BM Date : 04/17/22  Weight change: Filed Weights   04/02/22 1542 04/04/22 0438  Weight: 107 kg 103.1 kg    Intake/Output:   Intake/Output Summary (Last 24 hours) at 04/05/2022 1127 Last data filed at 04/05/2022 0900 Gross per 24 hour  Intake 320 ml  Output 2250 ml  Net -1930 ml       Physical Exam    General:  Well appearing. No resp difficulty HEENT: normal Neck: supple. no JVD. Carotids 2+ bilat; no bruits. No lymphadenopathy or thryomegaly appreciated. Cor: Regular tachy  No rubs, gallops or murmurs. Lungs: clear Abdomen: obese soft, nontender, nondistended. No hepatosplenomegaly. No bruits or masses. Good bowel sounds. Extremities: no cyanosis, clubbing, rash, edema Neuro: alert & orientedx3, cranial nerves grossly intact. moves all 4 extremities w/o difficulty. Affect pleasant  Telemetry   ST 110-115 Personally reviewed   Labs    CBC Recent Labs    04/04/22 0143 04/05/22 0026  WBC 12.7* 9.5  HGB 9.7* 9.1*  HCT 30.2* 28.4*  MCV 70.9* 71.0*  PLT 242 631    Basic Metabolic Panel Recent Labs    04/04/22 0143 04/04/22 1811 04/05/22 0026  NA 136  --  137  K 2.8* 3.4* 3.0*  CL 100   --  103  CO2 23  --  23  GLUCOSE 106*  --  98  BUN 21*  --  18  CREATININE 2.54*  --  2.46*  CALCIUM 8.6*  --  8.6*  MG 1.5*  --  1.9    Liver Function Tests Recent Labs    04/02/22 2040  AST 19  ALT 17  ALKPHOS 65  BILITOT 1.2  PROT 6.6  ALBUMIN 2.9*    No results for input(s): "LIPASE", "AMYLASE" in the last 72 hours. Cardiac Enzymes Recent Labs    04/02/22 2249  CKTOTAL 201     BNP: BNP (last 3 results) Recent Labs    04/02/22 1630  BNP 2,272.6*     ProBNP (last 3 results) No results for input(s): "PROBNP" in the last 8760 hours.   D-Dimer No results for input(s): "DDIMER" in the last 72 hours. Hemoglobin A1C Recent Labs    04/04/22 0143  HGBA1C 5.1    Fasting Lipid Panel Recent Labs    04/04/22 0143  CHOL 151  HDL 29*  LDLCALC 101*  TRIG 107  CHOLHDL 5.2    Thyroid Function Tests Recent Labs    04/03/22 0508  TSH 1.722     Other results:   Imaging   No results found.  Medications:     Scheduled Medications:  amLODipine  5 mg Oral  Daily   atorvastatin  40 mg Oral Daily   carvedilol  3.125 mg Oral BID WC   feeding supplement  237 mL Oral BID BM   heparin  5,000 Units Subcutaneous Q8H   isosorbide-hydrALAZINE  2 tablet Oral TID   multivitamin with minerals  1 tablet Oral Daily   potassium chloride  20 mEq Oral Daily    Infusions:    PRN Medications: acetaminophen, hydrALAZINE, zolpidem  Patient Profile  37 y/o female w/ h/o HTN, obesity, chronic IDA and uterine fibroids admitted w/ hypertensive emergency, acute systolic heart failure and renal insufficiency.   Assessment/Plan  1. Acute Systolic Heart Failure - New diagnosis. 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 No ischemic CP. HS trop not c/w ACS. No family h/o HF  - volume status looks good. Can stop IV lasix. Start po diuretics as needed - plan RHC after diuresis +/- LHC pending improvement in renal  fx - cMRI completed. Await results  - after load reduction with Bidil, may need to switch to hydral/imdur w/ high co-pay. - Currently on full-dose Bidil and hydralazine 50 q6. Will stop hydralazine.  - additional GDMT w/ MRA/ARB/ARNi once renal fx improv - continue coreg 3.125 BID - Will start jardiance   2. Hypertensive Emergency  - reports h/o gestational HTN but stopped meds 3 months postpartum w/ reported improved BP control  - BP 200/133 on admit  - associated unexplained hypokalemia raises concern for secondary hypertension, hyperaldosteronism.  - Check renin:aldosterone level prior to initiation of MRA and ARNi  (results still pending) - Check plasma metanephrines - renal US (-) - additional GDMT w/ MRA/ARB/ARNi once renal fx improves  - outpatient sleep study to r/o OSA - Increase amlodipine to 10   3. Renal Insuffiencey  - Unsure if acute vs chronic, last SCr on file was from 2020 (SCr 0.79) - SCr 2.62 on admit, 2.54 today - ? Hypertensive nephropathy  - Renal u/s with chronic disease - follow BMP closely w/ diuresis  - Hgb A1c 5.1  4. IDA - h/o heavy vaginal bleeding w/ h/o uterine fibroids - Hgb 9.7  - Ferritin 20, Tsat 12% - needs feraheme but will hold for now as she may need cMRI  - PO Fe at d/c per primary team    5. Hypokalemia/Hypomagnesemia - will supp - w/u to exclude hyperaldosteronism - consider addition of spiro pending improvement in SCr    6. Left Pleural Effusion  - mod size noted on 2D echo and CXR - currently on 3 L [place]. If no improvement in oxygenation w/ diuresis, may need thoracentesis. Will follow   7. Hyperlipidemia - LDL 101, goal <55 - start statin  Length of Stay: 3  Glori Bickers, MD  04/05/2022, 11:27 AM  Advanced Heart Failure Team Pager [phone] (M-F; 7a - 5p)  Please contact [facility] for night-coverage after hours (5p -7a ) and weekends on [email or web address]

## 2022-04-05 NOTE — Progress Notes (Addendum)
PROGRESS NOTE Maria Osborn  KCL:[id number] DOB: 02-Sep-1985 DOA: 04/02/2022 PCP: [facility]   Brief Narrative/Hospital Course: [name] with history of hypertension in the setting of pregnancy, obesity, chronic iron deficiency anemia, uterine fibroids presented to the ED with dyspnea on exertion noted to be in hypertensive emergency with acute systolic heart failure and renal insufficiency   Subjective: Breathing better, upset about diagnosis  Assessment and Plan:   Acute systolic congestive heart failure: New diagnosis, echo shows EF 20-25%. 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 -Now improving, continue BiDil, amlodipine, diuretics -Renal artery duplex negative for stenosis, renal ultrasound suggestive of chronic disease -Renin aldosterone, metanephrines pending  Renal insufficiency Acute versus chronic:Previous creatinine 2020 was 0.7, on admission 2.6.   -Largely unchanged, suspect hypertensive cardiomyopathy, renal ultrasound suggestive of medical renal disease, no hydronephrosis -Renal artery duplex negative for stenosis  -check urine prot/creat  IDA with history of vaginal bleeding/uterine fibroids: - hemoglobin 9.8, ferritin 20 saturation 12%, will give IV iron after cardiac MRI  Elevated troponin suspect demand ischemia -From CHF, no ACS  Hypokalemia Hypomagnesemia: -repleted, Renin-aldo pending   Class II Obesity:Patient's Body mass index is 37.84 kg/m. : Will benefit with PCP follow-up, weight loss  healthy lifestyle and outpatient sleep evaluation.   DVT prophylaxis: heparin injection 5,000  Units Start: 04/02/22 2200 Code Status:   Code Status: Full Code Family Communication: plan of care discussed with patient at bedside.   Objective: Vitals last 24 hrs: Vitals:   04/05/22 0023 04/05/22 0512 04/05/22 0548 04/05/22 0834  BP: (!) 159/89 (!) 165/84 (!) 148/95 (!) 151/81  Pulse: (!) 108 (!) 108  (!) 108  Resp: '16 16  18  '$ Temp: 98.3 F (36.8 C) 98.6 F (37 C)  98.1 F (36.7 C)  TempSrc: Oral Oral  Oral  SpO2: 100% 99%  97%  Weight:      Height:       Weight change:   Physical Examination: Gen: Awake, Alert, Oriented X 3,  HEENT: no JVD Lungs: Good air movement bilaterally, CTAB CVS: S1S2/RRR Abd: soft, Non tender, non distended, BS present Extremities: trace edema Skin: no new rashes on exposed skin   Medications reviewed:  Scheduled Meds:  [START ON 04/06/2022] amLODipine  10 mg Oral Daily   atorvastatin  40 mg Oral Daily   carvedilol  3.125 mg Oral BID WC   feeding supplement  237 mL Oral BID BM   heparin  5,000 Units Subcutaneous Q8H   isosorbide-hydrALAZINE  2 tablet Oral TID   multivitamin with minerals  1 tablet Oral Daily   potassium chloride  20 mEq Oral Daily   Continuous Infusions:    Diet Order             Diet 2 gram sodium Room service appropriate? Yes; Fluid consistency: Thin  Diet effective now                  Intake/Output Summary (Last 24 hours) at 04/05/2022 1225 Last data filed at 04/05/2022 0900 Gross per 24 hour  Intake 320 ml  Output 2250 ml  Net -1930 ml   Net IO Since Admission: -5,863.6 mL [04/05/22 1225]  Wt Readings from  Last 3 Encounters:  04/04/22 103.1 kg  01/21/19 122 kg  01/12/19 121.1 kg     Unresulted Labs (From admission, onward)     Start     Ordered   04/03/22 1323  Aldosterone + renin activity w/ ratio  Once,   R        04/03/22 1322   04/03/22 1323  Metanephrines, plasma  Once,   R        04/03/22 1322          Data Reviewed: I have personally reviewed following labs and imaging  studies CBC: Recent Labs  Lab 04/02/22 1546 04/03/22 0508 04/04/22 0143 04/05/22 0026  WBC 9.2 10.0 12.7* 9.5  HGB 9.8* 10.3* 9.7* 9.1*  HCT 31.1* 32.8* 30.2* 28.4*  MCV 70.7* 71.0* 70.9* 71.0*  PLT 235 254 242 945   Basic Metabolic Panel: Recent Labs  Lab 04/02/22 1546 04/02/22 2040 04/03/22 0508 04/03/22 1717 04/04/22 0143 04/04/22 1811 04/05/22 0026  NA 138  --  137  --  136  --  137  K 2.6*  --  2.8* 2.6* 2.8* 3.4* 3.0*  CL 102  --  101  --  100  --  103  CO2 25  --  24  --  23  --  23  GLUCOSE 87  --  106*  --  106*  --  98  BUN 24*  --  23*  --  21*  --  18  CREATININE 2.62*  --  2.57*  --  2.54*  --  2.46*  CALCIUM 8.5*  --  8.6*  --  8.6*  --  8.6*  MG  --  1.6* 2.1  --  1.5*  --  1.9   GFR: Estimated Creatinine Clearance: 38 mL/min (A) (by C-G formula based on SCr of 2.46 mg/dL (H)). Liver Function Tests: Recent Labs  Lab 04/02/22 2040  AST 19  ALT 17  ALKPHOS 65  BILITOT 1.2  PROT 6.6  ALBUMIN 2.9*  No results found for this or any previous visit (from the past 240 hour(s)).  Antimicrobials: Anti-infectives (From admission, onward)    None      Culture/Microbiology No results found for: "SDES", "SPECREQUEST", "CULT", "REPTSTATUS"  Radiology Studies: VAS US RENAL ARTERY DUPLEX  Result Date: 04/05/2022 ABDOMINAL VISCERAL Patient Name:  DAISSY YERIAN  Date of Exam:   04/04/2022 Medical Rec #: [id number]               Accession #:    [id number] Date of Birth: 1985-09-26              Patient Gender: F Patient Age:   37 years Exam Location:  [facility] Procedure:      VAS US RENAL ARTERY DUPLEX Referring Phys: [id number] [place] -------------------------------------------------------------------------------- Indications: Hypertension Performing Technologist: Darlin Coco RDMS, RVT  Examination Guidelines: A complete evaluation includes B-mode imaging, spectral Doppler, color Doppler, and power Doppler as needed of all accessible portions of  each vessel. Bilateral testing is considered an integral part of a complete examination. Limited examinations for reoccurring indications may be performed as noted.  Duplex Findings: +----------------------+--------+--------+------+--------+ Mesenteric            PSV cm/sEDV cm/sPlaqueComments +----------------------+--------+--------+------+--------+ Aorta Mid                86                          +----------------------+--------+--------+------+--------+  Celiac Artery Proximal  236                          +----------------------+--------+--------+------+--------+ SMA Proximal            155                          +----------------------+--------+--------+------+--------+    +------------------+--------+--------+-------+ Right Renal ArteryPSV cm/sEDV cm/sComment +------------------+--------+--------+-------+ Origin               71      20           +------------------+--------+--------+-------+ Proximal             99      32           +------------------+--------+--------+-------+ Mid                 112      33           +------------------+--------+--------+-------+ Distal               57      19           +------------------+--------+--------+-------+ +-----------------+--------+--------+-------+ Left Renal ArteryPSV cm/sEDV cm/sComment +-----------------+--------+--------+-------+ Origin             144      27           +-----------------+--------+--------+-------+ Proximal           140      33           +-----------------+--------+--------+-------+ Mid                 62      20           +-----------------+--------+--------+-------+ Distal              67      22           +-----------------+--------+--------+-------+ +------------+--------+--------+----+-----------+--------+--------+----+ Right KidneyPSV cm/sEDV cm/sRI  Left KidneyPSV cm/sEDV cm/sRI    +------------+--------+--------+----+-----------+--------+--------+----+ Upper Pole  30      11      0.62Upper Pole 15      6       0.58 +------------+--------+--------+----+-----------+--------+--------+----+ Mid         23      9       0.62Mid        29      12      0.59 +------------+--------+--------+----+-----------+--------+--------+----+ Lower Pole  18      6       0.67Lower Pole 18      6       0.66 +------------+--------+--------+----+-----------+--------+--------+----+ Hilar       26      7       0.73Hilar      20      8       0.58 +------------+--------+--------+----+-----------+--------+--------+----+ +------------------+-----+------------------+-----+ Right Kidney           Left Kidney             +------------------+-----+------------------+-----+ RAR                    RAR                     +------------------+-----+------------------+-----+ RAR (manual)      1.30 RAR (manual)      1.67  +------------------+-----+------------------+-----+ Cortex  Cortex                  +------------------+-----+------------------+-----+ Cortex thickness       Corex thickness         +------------------+-----+------------------+-----+ Kidney length (cm)12.05Kidney length (cm)11.13 +------------------+-----+------------------+-----+  Summary: Renal:  Right: No evidence of right renal artery stenosis. RRV flow present.        Normal size right kidney. Normal right Resisitive Index.        Increased echogenicity of the renal parenchyma. Left:  No evidence of left renal artery stenosis. LRV flow present.        Normal size of left kidney. Normal left Resistive Index.        Increased echogenicity of the renal parenchyma.  *See table(s) above for measurements and observations.  Diagnosing physician: Orlie Pollen  Electronically signed by Orlie Pollen on 04/05/2022 at 10:32:57 AM.    Final    US RENAL  Result Date: 04/03/2022 CLINICAL  DATA:  Acute kidney injury EXAM: RENAL / URINARY TRACT ULTRASOUND COMPLETE COMPARISON:  None Available. FINDINGS: Right Kidney: Renal measurements: 10.3 x 4.4 x 4.6 cm = volume: 109.8 mL. Increased parenchymal echogenicity. No mass or hydronephrosis visualized. Left Kidney: Renal measurements: 9.8 x 6.9 x 5.8 cm = volume: 105.5 mL. Increased parenchymal echogenicity. No mass or hydronephrosis visualized. Bladder: Appears normal for degree of bladder distention. Other: Bilateral pleural effusions. IMPRESSION: 1. No hydronephrosis. 2. Increased parenchymal echogenicity which can be seen in the setting of medical renal disease. 3. Bilateral pleural effusions. Electronically Signed   By: Yetta Glassman M.D.   On: 04/03/2022 14:09     LOS: 3 days   Domenic Polite, MD [facility]  04/05/2022, 12:25 PM

## 2022-04-05 NOTE — Plan of Care (Signed)
Champ Mungo, RN

## 2022-04-06 DIAGNOSIS — I5021 Acute systolic (congestive) heart failure: Secondary | ICD-10-CM | POA: Diagnosis not present

## 2022-04-06 LAB — HEPATITIS B SURFACE ANTIGEN: Hepatitis B Surface Ag: NONREACTIVE

## 2022-04-06 LAB — BASIC METABOLIC PANEL
Anion gap: 8 (ref 5–15)
BUN: 21 mg/dL — ABNORMAL HIGH (ref 6–20)
CO2: 25 mmol/L (ref 22–32)
Calcium: 8.2 mg/dL — ABNORMAL LOW (ref 8.9–10.3)
Chloride: 101 mmol/L (ref 98–111)
Creatinine, Ser: 2.6 mg/dL — ABNORMAL HIGH (ref 0.44–1.00)
GFR, Estimated: 24 mL/min — ABNORMAL LOW (ref >60)
Glucose, Bld: 99 mg/dL (ref 70–99)
Potassium: 2.8 mmol/L — ABNORMAL LOW (ref 3.5–5.1)
Sodium: 134 mmol/L — ABNORMAL LOW (ref 135–145)

## 2022-04-06 LAB — HIV ANTIBODY (ROUTINE TESTING W REFLEX): HIV Screen 4th Generation wRfx: NONREACTIVE

## 2022-04-06 LAB — CBC
HCT: 26.8 % — ABNORMAL LOW (ref 36.0–46.0)
Hemoglobin: 8.5 g/dL — ABNORMAL LOW (ref 12.0–15.0)
MCH: 22.7 pg — ABNORMAL LOW (ref 26.0–34.0)
MCHC: 31.7 g/dL (ref 30.0–36.0)
MCV: 71.5 fL — ABNORMAL LOW (ref 80.0–100.0)
Platelets: 223 10*3/uL (ref 150–400)
RBC: 3.75 MIL/uL — ABNORMAL LOW (ref 3.87–5.11)
RDW: 26.5 % — ABNORMAL HIGH (ref 11.5–15.5)
WBC: 8.2 10*3/uL (ref 4.0–10.5)
nRBC: 0 % (ref 0.0–0.2)

## 2022-04-06 LAB — HEPATITIS B CORE ANTIBODY, TOTAL: Hep B Core Total Ab: NONREACTIVE

## 2022-04-06 LAB — HEPATITIS C ANTIBODY: HCV Ab: NONREACTIVE

## 2022-04-06 MED ORDER — CARVEDILOL 3.125 MG PO TABS
3.1250 mg | ORAL_TABLET | Freq: Once | ORAL | Status: AC
  Administered 2022-04-06: 3.125 mg via ORAL
  Filled 2022-04-06: qty 1

## 2022-04-06 MED ORDER — CARVEDILOL 6.25 MG PO TABS
6.2500 mg | ORAL_TABLET | Freq: Two times a day (BID) | ORAL | Status: DC
  Administered 2022-04-06 – 2022-04-08 (×4): 6.25 mg via ORAL
  Filled 2022-04-06 (×4): qty 1

## 2022-04-06 MED ORDER — POTASSIUM CHLORIDE CRYS ER 20 MEQ PO TBCR
40.0000 meq | EXTENDED_RELEASE_TABLET | ORAL | Status: DC
  Administered 2022-04-06: 40 meq via ORAL
  Filled 2022-04-06: qty 2

## 2022-04-06 MED ORDER — SPIRONOLACTONE 12.5 MG HALF TABLET
12.5000 mg | ORAL_TABLET | Freq: Every day | ORAL | Status: DC
  Administered 2022-04-06: 12.5 mg via ORAL
  Filled 2022-04-06: qty 1

## 2022-04-06 MED ORDER — POTASSIUM CHLORIDE CRYS ER 20 MEQ PO TBCR
40.0000 meq | EXTENDED_RELEASE_TABLET | ORAL | Status: AC
  Administered 2022-04-06 (×2): 40 meq via ORAL
  Filled 2022-04-06 (×2): qty 2

## 2022-04-06 MED ORDER — ASPIRIN-ACETAMINOPHEN-CAFFEINE 250-250-65 MG PO TABS
1.0000 | ORAL_TABLET | Freq: Four times a day (QID) | ORAL | Status: DC | PRN
  Administered 2022-04-06: 1 via ORAL
  Filled 2022-04-06 (×2): qty 1

## 2022-04-06 MED ORDER — SODIUM CHLORIDE 0.9 % IV SOLN
510.0000 mg | Freq: Once | INTRAVENOUS | Status: AC
  Administered 2022-04-06: 510 mg via INTRAVENOUS
  Filled 2022-04-06: qty 17

## 2022-04-06 NOTE — Progress Notes (Addendum)
PROGRESS NOTE Maria Osborn  ZOX:[id number] DOB: 09/23/1985 DOA: 04/02/2022 PCP: [facility]   Brief Narrative/Hospital Course: [name] with history of hypertension in the setting of pregnancy, obesity, chronic iron deficiency anemia, uterine fibroids presented to the ED with dyspnea on exertion. In the ED, volume overloaded, BP 190s, potassium 2.6, creatinine 2.62, hemoglobin 9.8, MCV 70.7, troponin 87, and BNP 2273.  -admitted, started on diuretics, ECho w/ EF 20-25%   Subjective: Feels better overall, breathing is improving  Assessment and Plan:   Acute systolic congestive heart failure: New diagnosis, echo shows EF 20-25%. 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   -suspect hypertensive, renal ultrasound suggestive of medical renal disease, no hydronephrosis -Renal artery duplex negative for stenosis  -she also has 3.2 g of protein in her urine, concerning for nephrotic range proteinuria, no improvement in kidney function with diuretics, will request nephrology input, discussed with Dr. Augustin Coupe   Hypertensive emergency:  -presented with 190/130 blood pressure. -Now improving, continue BiDil, amlodipine, diuretics -Renal artery duplex negative for stenosis, renal ultrasound suggestive of chronic disease -Renin aldosterone, metanephrines pending  IDA with history of vaginal bleeding/uterine fibroids: - ferritin 20 saturation 12%, will give IV iron, MRI completed -Hemoglobin down to 8.5  today  Elevated troponin suspect demand ischemia -From CHF, no ACS  Hypokalemia Hypomagnesemia: -repleted, Renin-aldo pending -Check mag tomorrow   Class II Obesity:Patient's Body mass index is 37.93 kg/m. : Will benefit with PCP follow-up, weight loss  healthy lifestyle    DVT prophylaxis: heparin injection 5,000 Units Start: 04/02/22 2200 Code Status:   Code Status: Full Code Family Communication: plan of care discussed with patient at bedside.   Objective: Vitals last 24 hrs: Vitals:   04/06/22 0139 04/06/22 0440 04/06/22 0544 04/06/22 0827  BP: 128/76   (!) 152/91  Pulse:    (!) 113  Resp: '19 19  20  '$ Temp: 98.3 F (36.8 C) 98.3 F (36.8 C)  98.2 F (36.8 C)  TempSrc: Oral Oral  Oral  SpO2:    99%  Weight:   103.4 kg   Height:       Weight change:   Physical Examination: Gen: Awake, Alert, Oriented X 3,  HEENT: no JVD Lungs: Good air movement bilaterally, CTAB CVS: S1S2/RRR Abd: soft, Non tender, non distended, BS present Extremities: trace edema Skin: no new rashes on exposed skin   Medications reviewed:  Scheduled Meds:  amLODipine  10 mg Oral Daily   atorvastatin  40 mg Oral Daily   carvedilol  3.125 mg Oral Once   carvedilol  6.25 mg Oral BID WC   feeding supplement  237 mL Oral BID BM   heparin  5,000 Units Subcutaneous Q8H   isosorbide-hydrALAZINE  2 tablet Oral TID   multivitamin with minerals  1 tablet Oral Daily   potassium chloride  40 mEq Oral Q4H   Continuous Infusions:    Diet Order             Diet 2 gram sodium Room service appropriate? Yes; Fluid consistency: Thin  Diet effective  now                  Intake/Output Summary (Last 24 hours) at 04/06/2022 0933 Last data filed at 04/06/2022 0840 Gross per 24 hour  Intake 880 ml  Output 2040 ml  Net -1160 ml   Net IO Since Admission: -7,023.6 mL [04/06/22 0933]  Wt Readings from Last 3 Encounters:  04/06/22 103.4 kg  01/21/19 122 kg  01/12/19 121.1 kg     Unresulted Labs  (From admission, onward)     Start     Ordered   04/03/22 1323  Aldosterone + renin activity w/ ratio  Once,   R        04/03/22 1322   04/03/22 1323  Metanephrines, plasma  Once,   R        04/03/22 1322          Data Reviewed: I have personally reviewed following labs and imaging studies CBC: Recent Labs  Lab 04/02/22 1546 04/03/22 0508 04/04/22 0143 04/05/22 0026 04/06/22 0043  WBC 9.2 10.0 12.7* 9.5 8.2  HGB 9.8* 10.3* 9.7* 9.1* 8.5*  HCT 31.1* 32.8* 30.2* 28.4* 26.8*  MCV 70.7* 71.0* 70.9* 71.0* 71.5*  PLT 235 254 242 233 998   Basic Metabolic Panel: Recent Labs  Lab 04/02/22 1546 04/02/22 2040 04/03/22 0508 04/03/22 1717 04/04/22 0143 04/04/22 1811 04/05/22 0026 04/06/22 0043  NA 138  --  137  --  136  --  137 134*  K 2.6*  --  2.8* 2.6* 2.8* 3.4* 3.0* 2.8*  CL 102  --  101  --  100  --  103 101  CO2 25  --  24  --  23  --  23 25  GLUCOSE 87  --  106*  --  106*  --  98 99  BUN 24*  --  23*  --  21*  --  18 21*  CREATININE 2.62*  --  2.57*  --  2.54*  --  2.46* 2.60*  CALCIUM 8.5*  --  8.6*  --  8.6*  --  8.6* 8.2*  MG  --  1.6* 2.1  --  1.5*  --  1.9  --    GFR: Estimated Creatinine Clearance: 36 mL/min (A) (by C-G formula based on SCr of 2.6 mg/dL (H)). Liver Function Tests: Recent Labs  Lab 04/02/22 2040  AST 19  ALT 17  ALKPHOS 65  BILITOT 1.2  PROT 6.6  ALBUMIN 2.9*  No results found for this or any previous visit (from the past 240 hour(s)).  Antimicrobials: Anti-infectives (From admission, onward)    None      Culture/Microbiology No results found for: "SDES", "SPECREQUEST", "CULT", "REPTSTATUS"  Radiology Studies: VAS US RENAL ARTERY DUPLEX  Result Date: 04/05/2022 ABDOMINAL VISCERAL Patient Name:  Maria Osborn  Date of Exam:   04/04/2022 Medical Rec #: [id number]               Accession #:    [id number] Date of Birth: 29-Jun-1985              Patient Gender: F Patient Age:   37 years Exam Location:  [facility]  Procedure:      VAS US RENAL ARTERY DUPLEX Referring Phys: [id number] Lambert -------------------------------------------------------------------------------- Indications: Hypertension Performing Technologist: Darlin Coco RDMS, RVT  Examination Guidelines: A complete evaluation includes B-mode imaging, spectral Doppler, color Doppler, and power Doppler as needed of all accessible portions of each vessel. Bilateral testing  is considered an integral part of a complete examination. Limited examinations for reoccurring indications may be performed as noted.  Duplex Findings: +----------------------+--------+--------+------+--------+ Mesenteric            PSV cm/sEDV cm/sPlaqueComments +----------------------+--------+--------+------+--------+ Aorta Mid                86                          +----------------------+--------+--------+------+--------+ Celiac Artery Proximal  236                          +----------------------+--------+--------+------+--------+ SMA Proximal            155                          +----------------------+--------+--------+------+--------+    +------------------+--------+--------+-------+ Right Renal ArteryPSV cm/sEDV cm/sComment +------------------+--------+--------+-------+ Origin               71      20           +------------------+--------+--------+-------+ Proximal             99      32           +------------------+--------+--------+-------+ Mid                 112      33           +------------------+--------+--------+-------+ Distal               57      19           +------------------+--------+--------+-------+ +-----------------+--------+--------+-------+ Left Renal ArteryPSV cm/sEDV cm/sComment +-----------------+--------+--------+-------+ Origin             144      27           +-----------------+--------+--------+-------+ Proximal           140      33            +-----------------+--------+--------+-------+ Mid                 62      20           +-----------------+--------+--------+-------+ Distal              67      22           +-----------------+--------+--------+-------+ +------------+--------+--------+----+-----------+--------+--------+----+ Right KidneyPSV cm/sEDV cm/sRI  Left KidneyPSV cm/sEDV cm/sRI   +------------+--------+--------+----+-----------+--------+--------+----+ Upper Pole  30      11      0.62Upper Pole 15      6       0.58 +------------+--------+--------+----+-----------+--------+--------+----+ Mid         23      9       0.62Mid        29      12      0.59 +------------+--------+--------+----+-----------+--------+--------+----+ Lower Pole  18      6       0.67Lower Pole 18      6       0.66 +------------+--------+--------+----+-----------+--------+--------+----+ Hilar       26      7       0.73Hilar      20      8       0.58 +------------+--------+--------+----+-----------+--------+--------+----+ +------------------+-----+------------------+-----+ Right Kidney  Left Kidney             +------------------+-----+------------------+-----+ RAR                    RAR                     +------------------+-----+------------------+-----+ RAR (manual)      1.30 RAR (manual)      1.67  +------------------+-----+------------------+-----+ Cortex                 Cortex                  +------------------+-----+------------------+-----+ Cortex thickness       Corex thickness         +------------------+-----+------------------+-----+ Kidney length (cm)12.05Kidney length (cm)11.13 +------------------+-----+------------------+-----+  Summary: Renal:  Right: No evidence of right renal artery stenosis. RRV flow present.        Normal size right kidney. Normal right Resisitive Index.        Increased echogenicity of the renal parenchyma. Left:  No evidence of left renal  artery stenosis. LRV flow present.        Normal size of left kidney. Normal left Resistive Index.        Increased echogenicity of the renal parenchyma.  *See table(s) above for measurements and observations.  Diagnosing physician: Orlie Pollen  Electronically signed by Orlie Pollen on 04/05/2022 at 10:32:57 AM.    Final      LOS: 4 days   Domenic Polite, MD [facility]  04/06/2022, 9:33 AM

## 2022-04-06 NOTE — Plan of Care (Signed)
  Problem: Activity: Goal: Capacity to carry out activities will improve Outcome: Progressing   Problem: Health Behavior/Discharge Planning: Goal: Ability to manage health-related needs will improve Outcome: Progressing   Problem: Education: Goal: Knowledge of General Education information will improve Description: Including pain rating scale, medication(s)/side effects and non-pharmacologic comfort measures Outcome: Progressing

## 2022-04-06 NOTE — Plan of Care (Signed)
  Problem: Education: Goal: Ability to demonstrate management of disease process will improve Outcome: Progressing Goal: Ability to verbalize understanding of medication therapies will improve Outcome: Progressing   Problem: Activity: Goal: Capacity to carry out activities will improve Outcome: Progressing   Problem: Cardiac: Goal: Ability to achieve and maintain adequate cardiopulmonary perfusion will improve Outcome: Progressing   Problem: Education: Goal: Knowledge of General Education information will improve Description: Including pain rating scale, medication(s)/side effects and non-pharmacologic comfort measures Outcome: Progressing   Problem: Health Behavior/Discharge Planning: Goal: Ability to manage health-related needs will improve Outcome: Progressing   Problem: Clinical Measurements: Goal: Ability to maintain clinical measurements within normal limits will improve Outcome: Progressing Goal: Will remain free from infection Outcome: Progressing Goal: Diagnostic test results will improve Outcome: Progressing Goal: Respiratory complications will improve Outcome: Progressing Goal: Cardiovascular complication will be avoided Outcome: Progressing   Problem: Activity: Goal: Risk for activity intolerance will decrease Outcome: Progressing   Problem: Nutrition: Goal: Adequate nutrition will be maintained Outcome: Progressing   Problem: Coping: Goal: Level of anxiety will decrease Outcome: Progressing   Problem: Elimination: Goal: Will not experience complications related to bowel motility Outcome: Progressing Goal: Will not experience complications related to urinary retention Outcome: Progressing   Problem: Pain Managment: Goal: General experience of comfort will improve Outcome: Progressing

## 2022-04-06 NOTE — Progress Notes (Signed)
Pt has PRN BIPAP orders.  No distress noted

## 2022-04-06 NOTE — Consult Note (Signed)
Reason for Consult: Renal failure Referring Physician:  Dr. Domenic Polite  Chief Complaint: SOB  Assessment/Plan: Renal failure with renal ultrasound showing no e/o obstruction but increased echogenicity. Renal artery duplex negative for stenosis  as well. Patient has h/o gestational HTN but did not require BP meds 3 mths postpartum. Patient had normal renal function 01/21/2019. UPC 3.2, initial urine with RBC's and WBC's but repeat just a day later 0-5. Patient has been negative 7L since being hospitalized.  - Patient just started menstruating 2/3 and the urinalysis as well as UPC will not be accurate; will repeat urine studies again later in the week. - Patient has also not had any labs since 2021 (outpt labs) at which time she was told everything was normal. - Renin, aldosterone sent already, results pending. Less likely to be familial or a genetic disorder such as Liddles as she was normotensive after delivery not requiring any anti-hypertensives.. - Will send serologies as well given renal failure in the acute setting but may be from MAHA from the hypertensive urgency/emergency as well. Fortunately no indications for dialysis and pt is responding to the diuretics and antihypertensive regimen. - Will follow closely with you.  HFrEF EF 20-25% seen by cardiology responding to diuresis and eventually with planned RHC after diuresis and if renal function is stable. Hypertensive urgency - much better control now on orarl therapy.    HPI: Maria Osborn is an 37 y.o. female HTN, uterine fibroids, anemia, ashthma presenting with shortness of breath and leg swelling on 1/31. She has had worsening exertional dyspnea progressing to dyspnea at rest as well as leg swelling which has extended up into her thighs. She has had orthopnea as well. She was on antihypertensives only during her pregnancy but then was taken off as her blood pressure normalized after delivery. She has had the flu and  bronchitis recently but no prior history of CHF or cardiac disease. In the ED she was found to be hypertensive, tachypneic and tachycardic. CXR revealed cardiomegaly and BUN Cr at that time was 2.62 with a BNP of 2273.   ROS Pertinent items are noted in HPI.  Chemistry and CBC: Creatinine  Date/Time Value Ref Range Status  01/12/2019 11:01 AM 0.71 0.44 - 1.00 mg/dL Final   Creatinine, Ser  Date/Time Value Ref Range Status  04/06/2022 12:43 AM 2.60 (H) 0.44 - 1.00 mg/dL Final  04/05/2022 12:26 AM 2.46 (H) 0.44 - 1.00 mg/dL Final  04/04/2022 01:43 AM 2.54 (H) 0.44 - 1.00 mg/dL Final  04/03/2022 05:08 AM 2.57 (H) 0.44 - 1.00 mg/dL Final  04/02/2022 03:46 PM 2.62 (H) 0.44 - 1.00 mg/dL Final  01/21/2019 02:29 PM 0.79 0.44 - 1.00 mg/dL Final  12/25/2017 01:28 AM 0.52 0.44 - 1.00 mg/dL Final  06/11/2016 05:08 AM 0.84 0.44 - 1.00 mg/dL Final  05/29/2016 09:15 AM 0.77 0.44 - 1.00 mg/dL Final   Recent Labs  Lab 04/02/22 1546 04/03/22 0508 04/03/22 1717 04/04/22 0143 04/04/22 1811 04/05/22 0026 04/06/22 0043  NA 138 137  --  136  --  137 134*  K 2.6* 2.8* 2.6* 2.8* 3.4* 3.0* 2.8*  CL 102 101  --  100  --  103 101  CO2 25 24  --  23  --  23 25  GLUCOSE 87 106*  --  106*  --  98 99  BUN 24* 23*  --  21*  --  18 21*  CREATININE 2.62* 2.57*  --  2.54*  --  2.46* 2.60*  CALCIUM 8.5* 8.6*  --  8.6*  --  8.6* 8.2*   Recent Labs  Lab 04/03/22 0508 04/04/22 0143 04/05/22 0026 04/06/22 0043  WBC 10.0 12.7* 9.5 8.2  HGB 10.3* 9.7* 9.1* 8.5*  HCT 32.8* 30.2* 28.4* 26.8*  MCV 71.0* 70.9* 71.0* 71.5*  PLT 254 242 233 223   Liver Function Tests: Recent Labs  Lab 04/02/22 2040  AST 19  ALT 17  ALKPHOS 65  BILITOT 1.2  PROT 6.6  ALBUMIN 2.9*   No results for input(s): "LIPASE", "AMYLASE" in the last 168 hours. No results for input(s): "AMMONIA" in the last 168 hours. Cardiac Enzymes: Recent Labs  Lab 04/02/22 2249  CKTOTAL 201   Iron Studies: No results for input(s):  "IRON", "TIBC", "TRANSFERRIN", "FERRITIN" in the last 72 hours. PT/INR: '@LABRCNTIP'$ (inr:5)  Xrays/Other Studies: ) Results for orders placed or performed during the hospital encounter of 04/02/22 (from the past 48 hour(s))  Potassium     Status: Abnormal   Collection Time: 04/04/22  6:11 PM  Result Value Ref Range   Potassium 3.4 (L) 3.5 - 5.1 mmol/L    Comment: Performed at [facility] [address] [place], [place] [address]  Basic metabolic panel     Status: Abnormal   Collection Time: 04/05/22 12:26 AM  Result Value Ref Range   Sodium 137 135 - 145 mmol/L   Potassium 3.0 (L) 3.5 - 5.1 mmol/L   Chloride 103 98 - 111 mmol/L   CO2 23 22 - 32 mmol/L   Glucose, Bld 98 70 - 99 mg/dL    Comment: Glucose reference range applies only to samples taken after fasting for at least 8 hours.   BUN 18 6 - 20 mg/dL   Creatinine, Ser 2.46 (H) 0.44 - 1.00 mg/dL   Calcium 8.6 (L) 8.9 - 10.3 mg/dL   GFR, Estimated 26 (L) >60 mL/min    Comment: (NOTE) Calculated using the CKD-EPI Creatinine Equation (2021)    Anion gap 11 5 - 15    Comment: Performed at [facility] [address], [place], [place] [address]  Magnesium     Status: None   Collection Time: 04/05/22 12:26 AM  Result Value Ref Range   Magnesium 1.9 1.7 - 2.4 mg/dL    Comment: Performed at [facility] [address], [place], [place] [address]  CBC     Status: Abnormal   Collection Time: 04/05/22 12:26 AM  Result Value Ref Range   WBC 9.5 4.0 - 10.5 K/uL   RBC 4.00 3.87 - 5.11 MIL/uL   Hemoglobin 9.1 (L) 12.0 - 15.0 g/dL   HCT 28.4 (L) 36.0 - 46.0 %   MCV 71.0 (L) 80.0 - 100.0 fL   MCH 22.8 (L) 26.0 - 34.0 pg   MCHC 32.0 30.0 - 36.0 g/dL   RDW 26.2 (H) 11.5 - 15.5 %   Platelets 233 150 - 400 K/uL   nRBC 0.2 0.0 - 0.2 %    Comment: Performed at [facility], [place] [address], [place], [place] [address]  CBC     Status: Abnormal   Collection Time: 04/06/22 12:43 AM  Result Value Ref Range   WBC  8.2 4.0 - 10.5 K/uL   RBC 3.75 (L) 3.87 - 5.11 MIL/uL   Hemoglobin 8.5 (L) 12.0 - 15.0 g/dL    Comment: Reticulocyte Hemoglobin testing may be clinically indicated, consider ordering this additional test [id number]    HCT 26.8 (L)  36.0 - 46.0 %   MCV 71.5 (L) 80.0 - 100.0 fL   MCH 22.7 (L) 26.0 - 34.0 pg   MCHC 31.7 30.0 - 36.0 g/dL   RDW 26.5 (H) 11.5 - 15.5 %   Platelets 223 150 - 400 K/uL    Comment: REPEATED TO VERIFY   nRBC 0.0 0.0 - 0.2 %    Comment: Performed at [facility] [address] [place], [place] [address]  Basic metabolic panel     Status: Abnormal   Collection Time: 04/06/22 12:43 AM  Result Value Ref Range   Sodium 134 (L) 135 - 145 mmol/L   Potassium 2.8 (L) 3.5 - 5.1 mmol/L   Chloride 101 98 - 111 mmol/L   CO2 25 22 - 32 mmol/L   Glucose, Bld 99 70 - 99 mg/dL    Comment: Glucose reference range applies only to samples taken after fasting for at least 8 hours.   BUN 21 (H) 6 - 20 mg/dL   Creatinine, Ser 2.60 (H) 0.44 - 1.00 mg/dL   Calcium 8.2 (L) 8.9 - 10.3 mg/dL   GFR, Estimated 24 (L) >60 mL/min    Comment: (NOTE) Calculated using the CKD-EPI Creatinine Equation (2021)    Anion gap 8 5 - 15    Comment: Performed at [facility] [address], [place], [place] [address]   No results found.  PMH:   Past Medical History:  Diagnosis Date   Anemia    Fibroid    Fibroids    Headache     PSH:   Past Surgical History:  Procedure Laterality Date   CESAREAN SECTION N/A 12/26/2017   Procedure: CESAREAN SECTION;  Surgeon: Linda Hedges, DO;  Location: [facility];  Service: Obstetrics;  Laterality: N/A;   CESAREAN SECTION N/A 01/21/2019   Procedure: CESAREAN SECTION;  Surgeon: Azucena Fallen, MD;  Location: [facility];  Service: Obstetrics;  Laterality: N/A;   DILATION AND CURETTAGE OF UTERUS     HYSTEROSCOPY WITH D & C N/A 06/10/2016   Procedure: DILATATION & CURETTAGE/HYSTEROSCOPY;  Surgeon: Linda Hedges, DO;  Location: [facility];   Service: Gynecology;  Laterality: N/A;   MYOMECTOMY N/A 06/10/2016   Procedure: MYOMECTOMY, abdominal;  Surgeon: Linda Hedges, DO;  Location: [facility];  Service: Gynecology;  Laterality: N/A;    Allergies:  Allergies  Allergen Reactions   Amoxicillin Other (See Comments)    Digestive issues    Medications:   Prior to Admission medications   Medication Sig Start Date End Date Taking? Authorizing Provider  albuterol (VENTOLIN HFA) 108 (90 Base) MCG/ACT inhaler Inhale 1 puff into the lungs every 6 (six) hours as needed for wheezing or shortness of breath.   Yes [provider]  fexofenadine (ALLEGRA) 180 MG tablet Take 180 mg by mouth daily as needed for allergies or rhinitis.   Yes [provider]  fluticasone (FLONASE) 50 MCG/ACT nasal spray Place 1 spray into both nostrils daily as needed for allergies or rhinitis.   Yes [provider]  HYDROcodone bit-homatropine (HYDROMET) 5-1.5 MG/5ML syrup Take 5 mLs by mouth every 6 (six) hours as needed for cough.   Yes [provider]  promethazine (PHENERGAN) 25 MG tablet Take 25 mg by mouth every 6 (six) hours as needed for nausea or vomiting.   Yes [provider]  acetaminophen (TYLENOL) 500 MG tablet Take 2 tablets (1,000 mg total) by mouth every 6 (six) hours. Patient not taking: Reported on  04/02/2022 01/23/19   Azucena Fallen, MD  labetalol (NORMODYNE) 100 MG tablet Take 1 tablet (100 mg total) by mouth 3 (three) times daily. Patient not taking: Reported on 04/02/2022 01/23/19   Azucena Fallen, MD    Discontinued Meds:   Medications Discontinued During This Encounter  Medication Reason   acetaminophen (TYLENOL) 325 MG tablet Patient Preference   Iron-FA-B Cmp-C-Biot-Probiotic (FUSION PLUS) CAPS Patient Preference   furosemide (LASIX) injection 20 mg    nitroGLYCERIN 50 mg in dextrose 5 % 250 mL (0.2 mg/mL) infusion    potassium chloride 10 mEq in 100 mL IVPB    potassium chloride 10 mEq in 100  mL IVPB    potassium chloride SA (KLOR-CON M) CR tablet 40 mEq    potassium chloride SA (KLOR-CON M) CR tablet 40 mEq    isosorbide-hydrALAZINE (BIDIL) 20-37.5 MG per tablet 1 tablet    isosorbide-hydrALAZINE (BIDIL) 20-37.5 MG per tablet 1.5 tablet    furosemide (LASIX) injection 40 mg    hydrALAZINE (APRESOLINE) tablet 50 mg    amLODipine (NORVASC) tablet 5 mg    potassium chloride SA (KLOR-CON M) CR tablet 20 mEq    potassium chloride SA (KLOR-CON M) CR tablet 40 mEq    carvedilol (COREG) tablet 3.125 mg     Social History:  reports that she has never smoked. She has never used smokeless tobacco. She reports current alcohol use. She reports that she does not use drugs.  Family History:   Family History  Problem Relation Age of Onset   Migraines Father    Stroke Mother    Depression Mother    Lupus Maternal Aunt    Stroke Maternal Uncle     Blood pressure (!) 152/91, pulse (!) 113, temperature 98.2 F (36.8 C), temperature source Oral, resp. rate 20, height '5\' 5"'$  (1.651 m), weight 103.4 kg, last menstrual period 03/09/2022, SpO2 99 %, unknown if currently breastfeeding. General appearance: alert, cooperative, and appears stated age Head: Normocephalic, without obvious abnormality, atraumatic Eyes: negative Neck: no adenopathy, no carotid bruit, no JVD, supple, symmetrical, trachea midline, and thyroid not enlarged, symmetric, no tenderness/mass/nodules Back: symmetric, no curvature. ROM normal. No CVA tenderness. Resp: clear to auscultation bilaterally Cardio: regular rate and rhythm GI: soft, non-tender; bowel sounds normal; no masses,  no organomegaly Extremities: extremities normal, atraumatic, no cyanosis or edema Pulses: 2+ and symmetric       Kadience Macchi, Hunt Oris, MD 04/06/2022, 10:16 AM

## 2022-04-06 NOTE — Progress Notes (Addendum)
Advanced Heart Failure Rounding Note  PCP-Cardiologist: None   Subjective:    Renal US  No hydronephrosis. Increased parenchymal echogenicity which can be seen in the setting of medical renal disease. Bilateral pleural effusions.  Renal artery duplex today with no stenosis  Cr. 2.62>2.57>2.54 > 2.46 > 2.6  K 2.8 (Renal following)  Now off diuretics Denies CP or SOB. Has HA. SBP 130-150. Occasional palpitations  cMRI results still pending    Objective:   Weight Range: 103.4 kg Body mass index is 37.93 kg/m.   Vital Signs:   Temp:  [97.8 F (36.6 C)-98.5 F (36.9 C)] 98.4 F (36.9 C) (02/04 0850) Pulse Rate:  [103-113] 108 (02/04 1044) Resp:  [18-20] 18 (02/04 0850) BP: (128-158)/(71-93) 145/93 (02/04 1044) SpO2:  [99 %-100 %] 99 % (02/04 0827) Weight:  [103.4 kg] 103.4 kg (02/04 0544) Last BM Date : 04/05/22  Weight change: Filed Weights   04/02/22 1542 04/04/22 0438 04/06/22 0544  Weight: 107 kg 103.1 kg 103.4 kg    Intake/Output:   Intake/Output Summary (Last 24 hours) at 04/06/2022 1128 Last data filed at 04/06/2022 0840 Gross per 24 hour  Intake 880 ml  Output 2040 ml  Net -1160 ml      Physical Exam    General:  Well appearing. No resp difficulty HEENT: normal Neck: supple. no JVD. Carotids 2+ bilat; no bruits. No lymphadenopathy or thryomegaly appreciated. Cor: PMI nondisplaced. Regular tachy . No rubs, gallops or murmurs. Lungs: clear Abdomen: obese soft, nontender, nondistended. No hepatosplenomegaly. No bruits or masses. Good bowel sounds. Extremities: no cyanosis, clubbing, rash, edema Neuro: alert & orientedx3, cranial nerves grossly intact. moves all 4 extremities w/o difficulty. Affect pleasant   Telemetry   ST 100-110 Personally reviewed   Labs    CBC Recent Labs    04/05/22 0026 04/06/22 0043  WBC 9.5 8.2  HGB 9.1* 8.5*  HCT 28.4* 26.8*  MCV 71.0* 71.5*  PLT 233 932   Basic Metabolic Panel Recent Labs     04/04/22 0143 04/04/22 1811 04/05/22 0026 04/06/22 0043  NA 136  --  137 134*  K 2.8*   < > 3.0* 2.8*  CL 100  --  103 101  CO2 23  --  23 25  GLUCOSE 106*  --  98 99  BUN 21*  --  18 21*  CREATININE 2.54*  --  2.46* 2.60*  CALCIUM 8.6*  --  8.6* 8.2*  MG 1.5*  --  1.9  --    < > = values in this interval not displayed.   Liver Function Tests No results for input(s): "AST", "ALT", "ALKPHOS", "BILITOT", "PROT", "ALBUMIN" in the last 72 hours.  No results for input(s): "LIPASE", "AMYLASE" in the last 72 hours. Cardiac Enzymes No results for input(s): "CKTOTAL", "CKMB", "CKMBINDEX", "TROPONINI" in the last 72 hours.   BNP: BNP (last 3 results) Recent Labs    04/02/22 1630  BNP 2,272.6*    ProBNP (last 3 results) No results for input(s): "PROBNP" in the last 8760 hours.   D-Dimer No results for input(s): "DDIMER" in the last 72 hours. Hemoglobin A1C Recent Labs    04/04/22 0143  HGBA1C 5.1   Fasting Lipid Panel Recent Labs    04/04/22 0143  CHOL 151  HDL 29*  LDLCALC 101*  TRIG 107  CHOLHDL 5.2   Thyroid Function Tests No results for input(s): "TSH", "T4TOTAL", "T3FREE", "THYROIDAB" in the last 72 hours.  Invalid input(s): "FREET3"  Other results:   Imaging   No results found.  Medications:     Scheduled Medications:  amLODipine  10 mg Oral Daily   atorvastatin  40 mg Oral Daily   carvedilol  6.25 mg Oral BID WC   feeding supplement  237 mL Oral BID BM   heparin  5,000 Units Subcutaneous Q8H   isosorbide-hydrALAZINE  2 tablet Oral TID   multivitamin with minerals  1 tablet Oral Daily   potassium chloride  40 mEq Oral Q4H    Infusions:    PRN Medications: acetaminophen, aspirin-acetaminophen-caffeine, zolpidem  Patient Profile  37 y/o female w/ h/o HTN, obesity, chronic IDA and uterine fibroids admitted w/ hypertensive emergency, acute systolic heart failure and renal insufficiency.   Assessment/Plan  1. Acute Systolic Heart  Failure - New diagnosis. 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 No ischemic CP. HS trop not c/w ACS. No family h/o HF  - volume status looks good. Can stop IV lasix. Start po diuretics as needed - plan RHC after diuresis +/- LHC pending improvement in renal fx - cMRI completed. Await results  - Continue Bidil 2 tab tid - Continue coreg 3.125 BID - Start jardiance prior to D/C - Holding off on ACE/ARB/ARNI/MRA for now with CKD. With refractory hyperkalemia I think she would benefit from MRA. Will discuss with Renal -> start spiro 12.5 (if BP drops can back of amlodipine to accommodate)   2. Hypertensive Emergency  - reports h/o gestational HTN but stopped meds 3 months postpartum w/ reported improved BP control  - BP 200/133 on admit  - associated unexplained hypokalemia raises concern for secondary hypertension, hyperaldosteronism.  - Check renin:aldosterone level prior to initiation of MRA and ARNi  (results still pending) - Check plasma metanephrines - renal US (-) - additional GDMT w/ MRA/ARB/ARNi once renal fx improves. Management as above - outpatient sleep study to r/o OSA - BP still mildly elevated. Consider MRA as above   3. CKD IV - Unsure if acute vs chronic, last SCr on file was from 2020 (SCr 0.79) - SCr 2.62 on admit, 2.54 today - ? Hypertensive nephropathy  - Renal u/s with chronic disease - UA with proteinuria but c/b by active menses - Hgb A1c 5.1  4. IDA - h/o heavy vaginal bleeding w/ h/o uterine fibroids - Hgb 9.7 -> 9.1 > 8.5 - Ferritin 20, Tsat 12% - Rec'd Feraheme this am  - PO Fe at d/c per primary team    5. Hypokalemia/Hypomagnesemia - will supp - w/u to exclude hyperaldosteronism - consider addition of spiro pending improvement in SCr    6. Left Pleural Effusion  - mod size noted on 2D echo and CXR - currently on 3 L [place]. If no improvement in oxygenation w/ diuresis, may need thoracentesis.  Will follow   7. Hyperlipidemia - LDL 101, goal <55 - on statin  D/w renal ok to start spiro 12.5 today. If renal function stable in am will add SGLT2i.   Length of Stay: [place], [place]  04/06/2022, 11:28 AM  Advanced Heart Failure Team Pager [phone] (M-F; 7a - 5p)  Please contact [facility] for night-coverage after hours (5p -7a ) and weekends on [email or web address]

## 2022-04-07 ENCOUNTER — Other Ambulatory Visit (HOSPITAL_COMMUNITY): Payer: Self-pay

## 2022-04-07 ENCOUNTER — Telehealth (HOSPITAL_COMMUNITY): Payer: Self-pay

## 2022-04-07 DIAGNOSIS — I5021 Acute systolic (congestive) heart failure: Secondary | ICD-10-CM | POA: Diagnosis not present

## 2022-04-07 LAB — BASIC METABOLIC PANEL
Anion gap: 8 (ref 5–15)
BUN: 15 mg/dL (ref 6–20)
CO2: 21 mmol/L — ABNORMAL LOW (ref 22–32)
Calcium: 8.6 mg/dL — ABNORMAL LOW (ref 8.9–10.3)
Chloride: 105 mmol/L (ref 98–111)
Creatinine, Ser: 2.28 mg/dL — ABNORMAL HIGH (ref 0.44–1.00)
GFR, Estimated: 28 mL/min — ABNORMAL LOW (ref >60)
Glucose, Bld: 96 mg/dL (ref 70–99)
Potassium: 3.5 mmol/L (ref 3.5–5.1)
Sodium: 134 mmol/L — ABNORMAL LOW (ref 135–145)

## 2022-04-07 LAB — CBC
HCT: 29.4 % — ABNORMAL LOW (ref 36.0–46.0)
Hemoglobin: 9.1 g/dL — ABNORMAL LOW (ref 12.0–15.0)
MCH: 22.8 pg — ABNORMAL LOW (ref 26.0–34.0)
MCHC: 31 g/dL (ref 30.0–36.0)
MCV: 73.7 fL — ABNORMAL LOW (ref 80.0–100.0)
Platelets: 251 10*3/uL (ref 150–400)
RBC: 3.99 MIL/uL (ref 3.87–5.11)
RDW: 26.9 % — ABNORMAL HIGH (ref 11.5–15.5)
WBC: 7.4 10*3/uL (ref 4.0–10.5)
nRBC: 0 % (ref 0.0–0.2)

## 2022-04-07 LAB — MAGNESIUM: Magnesium: 1.8 mg/dL (ref 1.7–2.4)

## 2022-04-07 MED ORDER — MAGNESIUM SULFATE 2 GM/50ML IV SOLN
2.0000 g | Freq: Once | INTRAVENOUS | Status: AC
  Administered 2022-04-07: 2 g via INTRAVENOUS
  Filled 2022-04-07: qty 50

## 2022-04-07 MED ORDER — POTASSIUM CHLORIDE CRYS ER 20 MEQ PO TBCR
40.0000 meq | EXTENDED_RELEASE_TABLET | Freq: Every day | ORAL | Status: AC
  Administered 2022-04-07 – 2022-04-08 (×2): 40 meq via ORAL
  Filled 2022-04-07 (×2): qty 2

## 2022-04-07 MED ORDER — EMPAGLIFLOZIN 10 MG PO TABS
10.0000 mg | ORAL_TABLET | Freq: Every day | ORAL | Status: DC
  Administered 2022-04-07 – 2022-04-08 (×2): 10 mg via ORAL
  Filled 2022-04-07 (×2): qty 1

## 2022-04-07 MED ORDER — SPIRONOLACTONE 25 MG PO TABS
25.0000 mg | ORAL_TABLET | Freq: Every day | ORAL | Status: DC
  Administered 2022-04-07 – 2022-04-08 (×2): 25 mg via ORAL
  Filled 2022-04-07 (×2): qty 1

## 2022-04-07 NOTE — Progress Notes (Signed)
PROGRESS NOTE Maria Osborn  NKN:[id number] DOB: 1986-01-01 DOA: 04/02/2022 PCP: [facility]   Brief Narrative/Hospital Course: 36/F with history of hypertension in the setting of pregnancy, obesity, chronic iron deficiency anemia, uterine fibroids presented to the ED with dyspnea on exertion. In the ED, volume overloaded, BP 190s, potassium 2.6, creatinine 2.62, hemoglobin 9.8, MCV 70.7, troponin 87, and BNP 2273.  -admitted, started on diuretics, ECho w/ EF 20-25%   Subjective: Feels better overall, breathing is improving  Assessment and Plan:   Acute systolic congestive heart failure: New diagnosis, echo shows EF 20-25%. -Suspected to be hypertensive  -Diuresed with IV Lasix, 8.8 L negative, weight down, now appears euvolemic  -Diuretics on hold, continue hydralazine, nitrate, amlodipine, Coreg -GDMT limited by CKD, borderline GFR for SGLT2i await nephrology input -Advanced heart failure team following, plan for RHC at some point -Cardiac MRI, completed, EF 35%, mildly reduced RV, no myocardial LGE  Renal insufficiency Acute versus chronic:Previous creatinine 2020 was 0.7, on admission 2.6.   -suspect hypertensive, renal ultrasound suggestive of medical renal disease, no hydronephrosis -Renal artery duplex negative for stenosis  -Urine protein elevated at 3.2 g may not be accurate with current menstruation, needs to be repeated -Appreciate nephrology input renin, aldosterone and metanephrines pending -Serologies sent -Creatinine with mild improvement, 2.2 today   Hypertensive emergency:  -presented with 190/130 blood pressure. -Now improving, continue BiDil, amlodipine, diuretics -Renal artery duplex negative for stenosis, renal ultrasound suggestive of chronic disease -Renin aldosterone, metanephrines pending  IDA with history of vaginal bleeding/uterine fibroids: - ferritin 20 saturation 12%, given IV iron 2/4, MRI completed -Hemoglobin now  9.1  Elevated troponin suspect demand ischemia -From CHF, no ACS  Hypokalemia Hypomagnesemia: -repleted, Renin-aldo pending -Check mag tomorrow   Class II Obesity:Patient's Body mass index is 38.34 kg/m. : Will benefit with PCP follow-up, weight loss  healthy lifestyle    DVT prophylaxis: heparin injection 5,000 Units Start: 04/02/22 2200 Code Status:   Code Status: Full Code Family Communication: plan of care discussed with patient at bedside.   Objective: Vitals last 24 hrs: Vitals:   04/06/22 1752 04/06/22 2017 04/07/22 0427 04/07/22 0729  BP: 138/60  (!) 157/91 (!) 183/93  Pulse: 93  (!) 105 99  Resp: '20  20 18  '$ Temp: 98.7 F (37.1 C) 98.8 F (37.1 C) 97.6 F (36.4 C) 98.2 F (36.8 C)  TempSrc: Oral Oral Oral Oral  SpO2: 99%  100% 98%  Weight:   104.5 kg   Height:       Weight change: 1.109 kg  Physical Examination: Gen: Awake, Alert, Oriented X 3,  HEENT: no JVD Lungs: Good air movement bilaterally, CTAB CVS: S1S2/RRR Abd: soft, Non tender, non distended, BS present Extremities: No edema Skin: no new rashes on exposed skin  Medications reviewed:  Scheduled Meds:  amLODipine  10 mg Oral Daily   atorvastatin  40 mg Oral Daily   carvedilol  6.25 mg Oral BID WC   feeding supplement  237 mL Oral BID BM   heparin  5,000 Units Subcutaneous Q8H   isosorbide-hydrALAZINE  2 tablet Oral TID   multivitamin with minerals  1 tablet Oral Daily   potassium chloride  40 mEq Oral Daily   spironolactone  25 mg Oral Daily   Continuous Infusions:  magnesium sulfate bolus IVPB 2 g (04/07/22 0906)     Diet Order             Diet 2 gram sodium Room service appropriate?  Yes; Fluid consistency: Thin  Diet effective now                  Intake/Output Summary (Last 24 hours) at 04/07/2022 0958 Last data filed at 04/07/2022 0830 Gross per 24 hour  Intake 600 ml  Output 2300 ml  Net -1700 ml   Net IO Since Admission: -8,723.6 mL [04/07/22 0958]  Wt Readings from  Last 3 Encounters:  04/07/22 104.5 kg  01/21/19 122 kg  01/12/19 121.1 kg     Unresulted Labs (From admission, onward)     Start     Ordered   04/08/22 7939  Basic metabolic panel  Daily,   R      04/07/22 0831   04/08/22 0500  Magnesium  Tomorrow morning,   R        04/07/22 0831   04/08/22 0500  CBC  Daily,   R      04/07/22 0831   04/06/22 1134  ANCA Profile  Once,   R        04/06/22 1133   04/06/22 1134  ANA w/Reflex if Positive  Once,   R        04/06/22 1133   04/06/22 1134  C3 complement  Once,   R        04/06/22 1133   04/06/22 1134  C4 complement  Once,   R        04/06/22 1133   04/06/22 1134  Glomerular basement membrane antibodies  Once,   R        04/06/22 1133   04/06/22 1134  Rheumatoid factor  Once,   R        04/06/22 1133   04/06/22 1134  Protein electrophoresis, serum  Once,   R        04/06/22 1133   04/03/22 1323  Aldosterone + renin activity w/ ratio  Once,   R        04/03/22 1322   04/03/22 1323  Metanephrines, plasma  Once,   R        04/03/22 1322          Data Reviewed: I have personally reviewed following labs and imaging studies CBC: Recent Labs  Lab 04/03/22 0508 04/04/22 0143 04/05/22 0026 04/06/22 0043 04/07/22 0439  WBC 10.0 12.7* 9.5 8.2 7.4  HGB 10.3* 9.7* 9.1* 8.5* 9.1*  HCT 32.8* 30.2* 28.4* 26.8* 29.4*  MCV 71.0* 70.9* 71.0* 71.5* 73.7*  PLT 254 242 233 223 030   Basic Metabolic Panel: Recent Labs  Lab 04/02/22 2040 04/03/22 0508 04/03/22 1717 04/04/22 0143 04/04/22 1811 04/05/22 0026 04/06/22 0043 04/07/22 0439  NA  --  137  --  136  --  137 134* 134*  K  --  2.8*   < > 2.8* 3.4* 3.0* 2.8* 3.5  CL  --  101  --  100  --  103 101 105  CO2  --  24  --  23  --  23 25 21*  GLUCOSE  --  106*  --  106*  --  98 99 96  BUN  --  23*  --  21*  --  18 21* 15  CREATININE  --  2.57*  --  2.54*  --  2.46* 2.60* 2.28*  CALCIUM  --  8.6*  --  8.6*  --  8.6* 8.2* 8.6*  MG 1.6* 2.1  --  1.5*  --  1.9  --  1.8   < > =  values in this interval not displayed.   GFR: Estimated Creatinine Clearance: 41.3 mL/min (A) (by C-G formula based on SCr of 2.28 mg/dL (H)). Liver Function Tests: Recent Labs  Lab 04/02/22 2040  AST 19  ALT 17  ALKPHOS 65  BILITOT 1.2  PROT 6.6  ALBUMIN 2.9*  No results found for this or any previous visit (from the past 240 hour(s)).  Antimicrobials: Anti-infectives (From admission, onward)    None      Culture/Microbiology No results found for: "SDES", "SPECREQUEST", "CULT", "REPTSTATUS"  Radiology Studies: No results found.   LOS: 5 days   Domenic Polite, MD [facility]  04/07/2022, 9:58 AM

## 2022-04-07 NOTE — Telephone Encounter (Signed)
Pharmacy Patient Advocate Encounter  Insurance verification completed.    The patient is insured through [place]   The patient is currently admitted and ran test claims for the following: Jardiance.  Copays and coinsurance results were relayed to Inpatient clinical team.

## 2022-04-07 NOTE — Progress Notes (Signed)
At 6:30 pm, RN rounded on patient. Patient stated that around 6:00 pm, she felt her heart race/flutter for a few seconds. RN called CCMD and was informed that patient had a 7 beat run of SVT with HR of 129. Patient states she "feels fine now" except for a headache. Tylenol given. Patient resting in bed with call bell and belongings within reach.

## 2022-04-07 NOTE — Progress Notes (Addendum)
Advanced Heart Failure Rounding Note  PCP-Cardiologist: None   Subjective:    Renal US  No hydronephrosis. Increased parenchymal echogenicity which can be seen in the setting of medical renal disease. Bilateral pleural effusions.  Renal artery duplex today with no stenosis  Cr. 2.62>2.57>2.54 > 2.46 > 2.6 >2.28  K  3.5  (Renal following)  cMRI 1. Small-moderate right pleural effusion, small pericardial effusion.  2. Mild LV dilation with mild-moderate concentric LV hypertrophy. Diffuse hypokinesis with EF 35%.  3.  Normal RV size with mildly decreased systolic function, EF 16%.  4. No myocardial LGE, so no definitive evidence for prior MI, myocarditis, or infiltrative disease.  5.  Mildly elevated T1 suggesting a degree of LV fibrosis.  2/4 Started on spiro and coreg was increased to 6.25 mg twice a day.    Continues to have some palpitations with movement. Denies SOB.  Objective:   Weight Range: 104.5 kg Body mass index is 38.34 kg/m.   Vital Signs:   Temp:  [97.6 F (36.4 C)-98.8 F (37.1 C)] 98.2 F (36.8 C) (02/05 0729) Pulse Rate:  [93-113] 99 (02/05 0729) Resp:  [18-20] 18 (02/05 0729) BP: (138-183)/(60-93) 183/93 (02/05 0729) SpO2:  [98 %-100 %] 98 % (02/05 0729) Weight:  [104.5 kg] 104.5 kg (02/05 0427) Last BM Date : 04/06/22  Weight change: Filed Weights   04/04/22 0438 04/06/22 0544 04/07/22 0427  Weight: 103.1 kg 103.4 kg 104.5 kg    Intake/Output:   Intake/Output Summary (Last 24 hours) at 04/07/2022 0812 Last data filed at 04/07/2022 0435 Gross per 24 hour  Intake 880 ml  Output 2540 ml  Net -1660 ml      Physical Exam    General:  Well appearing. No resp difficulty HEENT: normal Neck: supple. no JVD. Carotids 2+ bilat; no bruits. No lymphadenopathy or thryomegaly appreciated. Cor: PMI nondisplaced. Regular tachy . No rubs, gallops or murmurs. Lungs: clear Abdomen: obese soft, nontender, nondistended. No hepatosplenomegaly. No  bruits or masses. Good bowel sounds. Extremities: no cyanosis, clubbing, rash, edema Neuro: alert & orientedx3, cranial nerves grossly intact. moves all 4 extremities w/o difficulty. Affect pleasant   Telemetry   SR -ST 90-100s    Labs    CBC Recent Labs    04/06/22 0043 04/07/22 0439  WBC 8.2 7.4  HGB 8.5* 9.1*  HCT 26.8* 29.4*  MCV 71.5* 73.7*  PLT 223 010   Basic Metabolic Panel Recent Labs    04/05/22 0026 04/06/22 0043 04/07/22 0439  NA 137 134* 134*  K 3.0* 2.8* 3.5  CL 103 101 105  CO2 23 25 21*  GLUCOSE 98 99 96  BUN 18 21* 15  CREATININE 2.46* 2.60* 2.28*  CALCIUM 8.6* 8.2* 8.6*  MG 1.9  --  1.8   Liver Function Tests No results for input(s): "AST", "ALT", "ALKPHOS", "BILITOT", "PROT", "ALBUMIN" in the last 72 hours.  No results for input(s): "LIPASE", "AMYLASE" in the last 72 hours. Cardiac Enzymes No results for input(s): "CKTOTAL", "CKMB", "CKMBINDEX", "TROPONINI" in the last 72 hours.   BNP: BNP (last 3 results) Recent Labs    04/02/22 1630  BNP 2,272.6*    ProBNP (last 3 results) No results for input(s): "PROBNP" in the last 8760 hours.   D-Dimer No results for input(s): "DDIMER" in the last 72 hours. Hemoglobin A1C No results for input(s): "HGBA1C" in the last 72 hours.  Fasting Lipid Panel No results for input(s): "CHOL", "HDL", "LDLCALC", "TRIG", "CHOLHDL", "LDLDIRECT" in the last  72 hours.  Thyroid Function Tests No results for input(s): "TSH", "T4TOTAL", "T3FREE", "THYROIDAB" in the last 72 hours.  Invalid input(s): "FREET3"   Other results:   Imaging   No results found.  Medications:     Scheduled Medications:  amLODipine  10 mg Oral Daily   atorvastatin  40 mg Oral Daily   carvedilol  6.25 mg Oral BID WC   feeding supplement  237 mL Oral BID BM   heparin  5,000 Units Subcutaneous Q8H   isosorbide-hydrALAZINE  2 tablet Oral TID   multivitamin with minerals  1 tablet Oral Daily   spironolactone  12.5 mg Oral  Daily    Infusions:    PRN Medications: acetaminophen, aspirin-acetaminophen-caffeine, zolpidem  Patient Profile  37 y/o female w/ h/o HTN, obesity, chronic IDA and uterine fibroids admitted w/ hypertensive emergency, acute systolic heart failure and renal insufficiency.   Assessment/Plan  1. Acute Systolic Heart Failure - New diagnosis. 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 No ischemic CP. HS trop not c/w ACS. No family h/o HF  - volume status looks good. Can stop IV lasix. Start po diuretics as needed - plan RHC after diuresis +/- LHC pending improvement in renal fx - cMRI - Mild-Mod LVH Diffuse HK EF 35%, RV mildly reduced 45%. No myocardial LGE.   - Volume status stable.  - Continue Bidil 2 tab tid - Continue coreg 6.25 BID - Start jardiance if ok with nephrology.  - Increase spiro  25 mg daily.  - Holding off on ACE/ARB/ARNI/MRA for now with CKD. With refractory hyperkalemia    2. Hypertensive Emergency  - reports h/o gestational HTN but stopped meds 3 months postpartum w/ reported improved BP control  - BP 200/133 on admit  - associated unexplained hypokalemia raises concern for secondary hypertension, hyperaldosteronism.  - Check renin:aldosterone level prior to initiation of MRA and ARNi  (results still pending) - Check plasma metanephrines - renal US (-) - additional GDMT w/ ARB/ARNi once renal fx improves. Management as above - outpatient sleep study to r/o OSA - On Bidil 2 tabs tid, coreg 6.25 mg twice a day, amlodipine 10 mg daily, and will increase spiro to 25 mg daily  - BP continues to run high. Increase spiro as noted above.    3. CKD IV - Unsure if acute vs chronic, last SCr on file was from 2020 (SCr 0.79) - SCr 2.62 on admit, 2.28 today - ? Hypertensive nephropathy  - Renal u/s with chronic disease - UA with proteinuria but c/b by active menses - Hgb A1c 5.1  4. IDA - h/o heavy vaginal bleeding  w/ h/o uterine fibroids - Hgb 9.7 -> 9.1 > 8.5>9.1  - Ferritin 20, Tsat 12% - Rec'd Feraheme this am  - PO Fe at d/c per primary team    5. Hypokalemia/Hypomagnesemia -Increase spiro to 25 mg daily.  - K 3.5  - w/u to exclude hyperaldosteronism - On spiro.    6. Left Pleural Effusion  - mod size noted on 2D echo and CXR - currently on 3 L [place]. If no improvement in oxygenation w/ diuresis, may need thoracentesis. Will follow   7. Hyperlipidemia - LDL 101, goal <55 - on statin  Consult cardiac rehab.     Length of Stay: 5  Amy Clegg, NP  04/07/2022, 8:12 AM  Advanced Heart Failure Team Pager [phone] (M-F; 7a - 5p)  Please contact [facility] for night-coverage after hours (5p -7a ) and weekends on [email or web address]   Patient seen and examined with the above-signed Advanced Practice Provider and/or Housestaff. I personally reviewed laboratory data, imaging studies and relevant notes. I independently examined the patient and formulated the important aspects of the plan. I have edited the note to reflect any of my changes or salient points. I have personally discussed the plan with the patient and/or family.  Feeling better today. Still with occasional palpitations. SBP still elevated. SCR improved to 2.2   cMRI reviewed with her   General:  Well appearing. No resp difficulty HEENT: normal Neck: supple. no JVD. Carotids 2+ bilat; no bruits. No lymphadenopathy or thryomegaly appreciated. Cor: PMI nondisplaced. Regular rate & rhythm. No rubs, gallops or murmurs. Lungs: clear Abdomen: obese soft, nontender, nondistended. No hepatosplenomegaly. No bruits or masses. Good bowel sounds. Extremities: no cyanosis, clubbing, rash, edema Neuro: alert & orientedx3, cranial nerves grossly intact. moves all 4 extremities w/o difficulty. Affect pleasant  Improving. cMRI suggestive of HTN cardiomyopathy. Will continue to titrate HF and BP meds. Will start SGLT2i. ([place] per Renal). Hopafully  stable for d/c from HF standpoint tomorrow.   Glori Bickers, MD  11:48 AM

## 2022-04-07 NOTE — TOC Benefit Eligibility Note (Signed)
Patient Teacher, English as a foreign language completed.    The patient is currently admitted and upon discharge could be taking Jardiance '10mg'$ .  The current 30 day co-pay is $594.95.   The patient is insured through [place]

## 2022-04-07 NOTE — Plan of Care (Signed)
  Problem: Education: Goal: Ability to demonstrate management of disease process will improve Outcome: Progressing Goal: Ability to verbalize understanding of medication therapies will improve Outcome: Progressing   

## 2022-04-07 NOTE — Progress Notes (Signed)
[facility] Progress Note   Subjective:   seen in room - husband bedside, no new symptoms, feeling ok  Objective Vitals:   04/06/22 1752 04/06/22 2017 04/07/22 0427 04/07/22 0729  BP: 138/60  (!) 157/91 (!) 183/93  Pulse: 93  (!) 105 99  Resp: '20  20 18  '$ Temp: 98.7 F (37.1 C) 98.8 F (37.1 C) 97.6 F (36.4 C) 98.2 F (36.8 C)  TempSrc: Oral Oral Oral Oral  SpO2: 99%  100% 98%  Weight:   104.5 kg   Height:       Physical Exam General: lying comfortably in bed Heart: RRR Lungs: normal WOB on RA Abdomen: soft, obese Extremities: no edema Skin: no rashes  Additional Objective Labs: Basic Metabolic Panel: Recent Labs  Lab 04/05/22 0026 04/06/22 0043 04/07/22 0439  NA 137 134* 134*  K 3.0* 2.8* 3.5  CL 103 101 105  CO2 23 25 21*  GLUCOSE 98 99 96  BUN 18 21* 15  CREATININE 2.46* 2.60* 2.28*  CALCIUM 8.6* 8.2* 8.6*   Liver Function Tests: Recent Labs  Lab 04/02/22 2040  AST 19  ALT 17  ALKPHOS 65  BILITOT 1.2  PROT 6.6  ALBUMIN 2.9*   No results for input(s): "LIPASE", "AMYLASE" in the last 168 hours. CBC: Recent Labs  Lab 04/03/22 0508 04/04/22 0143 04/05/22 0026 04/06/22 0043 04/07/22 0439  WBC 10.0 12.7* 9.5 8.2 7.4  HGB 10.3* 9.7* 9.1* 8.5* 9.1*  HCT 32.8* 30.2* 28.4* 26.8* 29.4*  MCV 71.0* 70.9* 71.0* 71.5* 73.7*  PLT 254 242 233 223 251   Blood Culture No results found for: "SDES", "SPECREQUEST", "CULT", "REPTSTATUS"  Cardiac Enzymes: Recent Labs  Lab 04/02/22 2249  CKTOTAL 201   CBG: No results for input(s): "GLUCAP" in the last 168 hours. Iron Studies: No results for input(s): "IRON", "TIBC", "TRANSFERRIN", "FERRITIN" in the last 72 hours. '@lablastinr3'$ @ Studies/Results: No results found. Medications:  magnesium sulfate bolus IVPB 2 g (04/07/22 0906)    amLODipine  10 mg Oral Daily   atorvastatin  40 mg Oral Daily   carvedilol  6.25 mg Oral BID WC   feeding supplement  237 mL Oral BID BM   heparin  5,000  Units Subcutaneous Q8H   isosorbide-hydrALAZINE  2 tablet Oral TID   multivitamin with minerals  1 tablet Oral Daily   potassium chloride  40 mEq Oral Daily   spironolactone  25 mg Oral Daily    Assessment/Plan: Renal failure with renal ultrasound showing no e/o obstruction but increased echogenicity. Renal artery duplex negative for stenosis  as well. Patient has h/o gestational HTN but did not require BP meds 3 mths postpartum. Patient had normal renal function 01/21/2019. UPC 3.2, initial urine with RBC's and WBC's but repeat just a day later 0-5. Patient has been negative 8.7L since being hospitalized.  - Patient just started menstruating 2/3 and the urinalysis as well as UPC will not be accurate; will repeat urine studies again later in the week. - Patient has also not had any labs since 2021 (outpt labs) at which time she was told everything was normal. - Renin, aldosterone sent already, results pending. Less likely to be familial or a genetic disorder such as Liddles as she was normotensive after delivery not requiring any anti-hypertensives.. - Sent serologies as well given renal failure in the acute setting but may be from MAHA from the hypertensive urgency/emergency as well. Fortunately no indications for dialysis and pt is responding to the diuretics and  antihypertensive regimen. - Will follow closely with you.   HFrEF EF 20-25% seen by cardiology responding to diuresis and eventually with planned RHC after diuresis and if renal function is stable.  Med therapy per cardiology - ok with SGLT2i initiation.  Hypertensive urgency - much better control now on oral therapy.   Jannifer Hick MD 04/07/2022, 9:41 AM  [facility] Pager: [phone]

## 2022-04-08 ENCOUNTER — Other Ambulatory Visit (HOSPITAL_COMMUNITY): Payer: Self-pay

## 2022-04-08 DIAGNOSIS — I5021 Acute systolic (congestive) heart failure: Secondary | ICD-10-CM | POA: Diagnosis not present

## 2022-04-08 LAB — BASIC METABOLIC PANEL
Anion gap: 8 (ref 5–15)
BUN: 13 mg/dL (ref 6–20)
CO2: 20 mmol/L — ABNORMAL LOW (ref 22–32)
Calcium: 8.5 mg/dL — ABNORMAL LOW (ref 8.9–10.3)
Chloride: 107 mmol/L (ref 98–111)
Creatinine, Ser: 2.04 mg/dL — ABNORMAL HIGH (ref 0.44–1.00)
GFR, Estimated: 32 mL/min — ABNORMAL LOW (ref >60)
Glucose, Bld: 90 mg/dL (ref 70–99)
Potassium: 3.6 mmol/L (ref 3.5–5.1)
Sodium: 135 mmol/L (ref 135–145)

## 2022-04-08 LAB — CBC
HCT: 27.4 % — ABNORMAL LOW (ref 36.0–46.0)
Hemoglobin: 8.7 g/dL — ABNORMAL LOW (ref 12.0–15.0)
MCH: 23.1 pg — ABNORMAL LOW (ref 26.0–34.0)
MCHC: 31.8 g/dL (ref 30.0–36.0)
MCV: 72.7 fL — ABNORMAL LOW (ref 80.0–100.0)
Platelets: 217 10*3/uL (ref 150–400)
RBC: 3.77 MIL/uL — ABNORMAL LOW (ref 3.87–5.11)
RDW: 26.9 % — ABNORMAL HIGH (ref 11.5–15.5)
WBC: 6.5 10*3/uL (ref 4.0–10.5)
nRBC: 0 % (ref 0.0–0.2)

## 2022-04-08 LAB — ENA+DNA/DS+ANTICH+CENTRO+JO...
Anti JO-1: <0.2 AI (ref 0.0–0.9)
Centromere Ab Screen: <0.2 AI (ref 0.0–0.9)
Chromatin Ab SerPl-aCnc: <0.2 AI (ref 0.0–0.9)
ENA SM Ab Ser-aCnc: <0.2 AI (ref 0.0–0.9)
Ribonucleic Protein: 1.1 AI — ABNORMAL HIGH (ref 0.0–0.9)
SSA (Ro) (ENA) Antibody, IgG: <0.2 AI (ref 0.0–0.9)
SSB (La) (ENA) Antibody, IgG: <0.2 AI (ref 0.0–0.9)
Scleroderma (Scl-70) (ENA) Antibody, IgG: 0.3 AI (ref 0.0–0.9)
ds DNA Ab: 1 IU/mL (ref 0–9)

## 2022-04-08 LAB — ANA W/REFLEX IF POSITIVE: Anti Nuclear Antibody (ANA): POSITIVE — AB

## 2022-04-08 LAB — RHEUMATOID FACTOR: Rheumatoid fact SerPl-aCnc: 70 IU/mL — ABNORMAL HIGH (ref <14.0)

## 2022-04-08 LAB — MAGNESIUM: Magnesium: 2.3 mg/dL (ref 1.7–2.4)

## 2022-04-08 LAB — GLOMERULAR BASEMENT MEMBRANE ANTIBODIES: GBM Ab: <0.2 units (ref 0.0–0.9)

## 2022-04-08 LAB — C4 COMPLEMENT: Complement C4, Body Fluid: 37 mg/dL (ref 12–38)

## 2022-04-08 LAB — C3 COMPLEMENT: C3 Complement: 169 mg/dL — ABNORMAL HIGH (ref 82–167)

## 2022-04-08 MED ORDER — CARVEDILOL 6.25 MG PO TABS
6.2500 mg | ORAL_TABLET | Freq: Two times a day (BID) | ORAL | 0 refills | Status: DC
  Filled 2022-04-08: qty 60, 30d supply, fill #0

## 2022-04-08 MED ORDER — ATORVASTATIN CALCIUM 40 MG PO TABS
40.0000 mg | ORAL_TABLET | Freq: Every day | ORAL | 0 refills | Status: DC
  Filled 2022-04-08: qty 30, 30d supply, fill #0

## 2022-04-08 MED ORDER — EMPAGLIFLOZIN 10 MG PO TABS
10.0000 mg | ORAL_TABLET | Freq: Every day | ORAL | 0 refills | Status: DC
  Filled 2022-04-08: qty 30, 30d supply, fill #0

## 2022-04-08 MED ORDER — HYDRALAZINE HCL 25 MG PO TABS
75.0000 mg | ORAL_TABLET | Freq: Three times a day (TID) | ORAL | 0 refills | Status: DC
  Filled 2022-04-08: qty 270, 30d supply, fill #0

## 2022-04-08 MED ORDER — ISOSORBIDE MONONITRATE ER 60 MG PO TB24: 60.0000 mg | ORAL_TABLET | Freq: Two times a day (BID) | ORAL | Status: DC

## 2022-04-08 MED ORDER — SPIRONOLACTONE 25 MG PO TABS
25.0000 mg | ORAL_TABLET | Freq: Every day | ORAL | 0 refills | Status: DC
  Filled 2022-04-08: qty 30, 30d supply, fill #0

## 2022-04-08 MED ORDER — AMLODIPINE BESYLATE 10 MG PO TABS
10.0000 mg | ORAL_TABLET | Freq: Every day | ORAL | 0 refills | Status: DC
  Filled 2022-04-08: qty 30, 30d supply, fill #0

## 2022-04-08 MED ORDER — HYDRALAZINE HCL 50 MG PO TABS: 75.0000 mg | ORAL_TABLET | Freq: Three times a day (TID) | ORAL | Status: DC

## 2022-04-08 MED ORDER — ISOSORBIDE MONONITRATE ER 60 MG PO TB24
60.0000 mg | ORAL_TABLET | Freq: Two times a day (BID) | ORAL | 0 refills | Status: DC
  Filled 2022-04-08: qty 60, 30d supply, fill #0

## 2022-04-08 NOTE — Plan of Care (Signed)

## 2022-04-08 NOTE — Progress Notes (Signed)
[facility] Progress Note   Subjective:   seen in room - husband bedside, no new symptoms, feeling good.  Much improved from admission.  I/Os yest 0.9 / 2650 Still menstruating Notes foamy urine now  Objective Vitals:   04/07/22 1209 04/07/22 1549 04/07/22 1951 04/08/22 0618  BP: (!) 143/86 (!) 151/84 137/85 (!) 151/97  Pulse: 93 97 96 95  Resp: '18 20 20 20  '$ Temp: 98 F (36.7 C) 97.8 F (36.6 C) 97.8 F (36.6 C) 97.8 F (36.6 C)  TempSrc: Oral Oral Oral Oral  SpO2: 100% 100% 100% 100%  Weight:    103 kg  Height:       Physical Exam General: lying comfortably in bed  Heart: RRR Lungs: normal WOB on RA Abdomen: soft, obese Extremities: no edema Skin: no rashes  Additional Objective Labs: Basic Metabolic Panel: Recent Labs  Lab 04/06/22 0043 04/07/22 0439 04/08/22 0653  NA 134* 134* 135  K 2.8* 3.5 3.6  CL 101 105 107  CO2 25 21* 20*  GLUCOSE 99 96 90  BUN 21* 15 13  CREATININE 2.60* 2.28* 2.04*  CALCIUM 8.2* 8.6* 8.5*    Liver Function Tests: Recent Labs  Lab 04/02/22 2040  AST 19  ALT 17  ALKPHOS 65  BILITOT 1.2  PROT 6.6  ALBUMIN 2.9*    No results for input(s): "LIPASE", "AMYLASE" in the last 168 hours. CBC: Recent Labs  Lab 04/04/22 0143 04/05/22 0026 04/06/22 0043 04/07/22 0439 04/08/22 0653  WBC 12.7* 9.5 8.2 7.4 6.5  HGB 9.7* 9.1* 8.5* 9.1* 8.7*  HCT 30.2* 28.4* 26.8* 29.4* 27.4*  MCV 70.9* 71.0* 71.5* 73.7* 72.7*  PLT 242 233 223 251 217    Blood Culture No results found for: "SDES", "SPECREQUEST", "CULT", "REPTSTATUS"  Cardiac Enzymes: Recent Labs  Lab 04/02/22 2249  CKTOTAL 201    CBG: No results for input(s): "GLUCAP" in the last 168 hours. Iron Studies: No results for input(s): "IRON", "TIBC", "TRANSFERRIN", "FERRITIN" in the last 72 hours. '@lablastinr3'$ @ Studies/Results: No results found. Medications:    amLODipine  10 mg Oral Daily   atorvastatin  40 mg Oral Daily   carvedilol  6.25 mg Oral  BID WC   empagliflozin  10 mg Oral Daily   feeding supplement  237 mL Oral BID BM   heparin  5,000 Units Subcutaneous Q8H   isosorbide-hydrALAZINE  2 tablet Oral TID   multivitamin with minerals  1 tablet Oral Daily   potassium chloride  40 mEq Oral Daily   spironolactone  25 mg Oral Daily    Assessment/Plan: AKI:  Normal kidney function as of 2020, presented with Cr 2.6 in setting of HTN urgency.  Renal ultrasound showing no e/o obstruction but increased echogenicity.   UPC 3.2, initial urine with RBC's and WBC's but repeat just a day later 0-5. UOP has been great. - Patient just started menstruating 2/3 and the urinalysis as well as UPC will not be accurate   - Patient has also not had any labs since 2021 (outpt labs) at which time she was told everything was normal. - Sent serologies as well given renal failure in the acute setting but may be from MAHA from the hypertensive urgency/emergency as well. +ANA and abnormal ribonucleic protein (ref <0.9, hers 1.1). +RF 70 (ref < 14), C3 and C4 ok. dsDNA normal.  Fortunately no indications for dialysis and pt is responding to the diuretics and antihypertensive regimen. -I'll see her back in clinic in 2 weeks  to reassess urine sediment, labs and decide about a renal biopsy.  My office will reach out to her with date/time.   HFrEF EF 20-25%: suspected HTN CM vs viral induced (flu 01/2022).  Med therapy per cardiology - ok with SGLT2i initiation. Low na diet. Hypertensive urgency -  Patient has h/o gestational HTN but did not require BP meds 3 mths postpartum.   Renal artery duplex neg RAS.  Un likely to be familial or a genetic disorder such as Liddles as she was normotensive after delivery not requiring any anti-hypertensives.  ARR pending, metanephrines pending. Much better control now on oral therapy.   As above ok w d/c today with close f/u in outpt setting.    Jannifer Hick MD 04/08/2022, 8:21 AM  [facility] Pager: [phone]

## 2022-04-08 NOTE — TOC Transition Note (Signed)
Transition of Care [facility]) - CM/SW Discharge Note   Patient Details  Name: Maria Osborn MRN: [id number] Date of Birth: 1986-01-28  Transition of Care [facility]) CM/SW Contact:  Erenest Rasher, RN Phone Number: [phone] 04/08/2022, 2:23 PM   Clinical Narrative:    TOC CM spoke to pt and requesting note for work. Pt has scale at home for daily weights. Explained the importance of daily weights.    Final next level of care: Home/Self Care Barriers to Discharge: No Barriers Identified   Patient Goals and CMS Choice      Discharge Placement                         Discharge Plan and Services Additional resources added to the After Visit Summary for     Discharge Planning Services: CM Consult                                 Social Determinants of Health (SDOH) Interventions SDOH Screenings   Food Insecurity: No Food Insecurity (04/03/2022)  Housing: Low Risk  (04/03/2022)  Transportation Needs: No Transportation Needs (04/03/2022)  Utilities: Not At Risk (04/03/2022)  Financial Resource Strain: Low Risk  (01/21/2019)  Physical Activity: Insufficiently Active (01/21/2019)  Social Connections: Moderately Integrated (01/21/2019)  Stress: No Stress Concern Present (01/21/2019)  Tobacco Use: Low Risk  (04/02/2022)     Readmission Risk Interventions     No data to display

## 2022-04-08 NOTE — Progress Notes (Addendum)
Advanced Heart Failure Rounding Note  PCP-Cardiologist: None   Subjective:    Renal US  No hydronephrosis. Increased parenchymal echogenicity which can be seen in the setting of medical renal disease. Bilateral pleural effusions.  Renal artery duplex today with no stenosis  Cr. 2.62>2.57>2.54 > 2.46 > 2.6 >2.28 >2.04   (Renal following)  cMRI 1. Small-moderate right pleural effusion, small pericardial effusion.  2. Mild LV dilation with mild-moderate concentric LV hypertrophy. Diffuse hypokinesis with EF 35%.  3.  Normal RV size with mildly decreased systolic function, EF 78%.  4. No myocardial LGE, so no definitive evidence for prior MI, myocarditis, or infiltrative disease.  5.  Mildly elevated T1 suggesting a degree of LV fibrosis.  ANA + RF 70    2/4 Started on spiro and coreg was increased to 6.25 mg twice a day.  2/5 Started on jardiance.   Wants to go home. Denies SOB.    Objective:   Weight Range: 103 kg Body mass index is 37.79 kg/m.   Vital Signs:   Temp:  [97.8 F (36.6 C)-98 F (36.7 C)] 97.8 F (36.6 C) (02/06 0618) Pulse Rate:  [90-97] 90 (02/06 0900) Resp:  [18-20] 18 (02/06 0900) BP: (137-151)/(82-97) 140/82 (02/06 0900) SpO2:  [97 %-100 %] 97 % (02/06 0900) Weight:  [103 kg] 103 kg (02/06 0618) Last BM Date : 04/06/22  Weight change: Filed Weights   04/06/22 0544 04/07/22 0427 04/08/22 0618  Weight: 103.4 kg 104.5 kg 103 kg    Intake/Output:   Intake/Output Summary (Last 24 hours) at 04/08/2022 1120 Last data filed at 04/08/2022 0813 Gross per 24 hour  Intake 890 ml  Output 3550 ml  Net -2660 ml      Physical Exam   General:  Well appearing. No resp difficulty HEENT: normal Neck: supple. no JVD. Carotids 2+ bilat; no bruits. No lymphadenopathy or thryomegaly appreciated. Cor: PMI nondisplaced. Regular rate & rhythm. No rubs, gallops or murmurs. Lungs: clear Abdomen: soft, nontender, nondistended. No hepatosplenomegaly. No  bruits or masses. Good bowel sounds. Extremities: no cyanosis, clubbing, rash, edema Neuro: alert & orientedx3, cranial nerves grossly intact. moves all 4 extremities w/o difficulty. Affect pleasant   Telemetry   SR 90s    Labs    CBC Recent Labs    04/07/22 0439 04/08/22 0653  WBC 7.4 6.5  HGB 9.1* 8.7*  HCT 29.4* 27.4*  MCV 73.7* 72.7*  PLT 251 295   Basic Metabolic Panel Recent Labs    04/07/22 0439 04/08/22 0653  NA 134* 135  K 3.5 3.6  CL 105 107  CO2 21* 20*  GLUCOSE 96 90  BUN 15 13  CREATININE 2.28* 2.04*  CALCIUM 8.6* 8.5*  MG 1.8 2.3   Liver Function Tests No results for input(s): "AST", "ALT", "ALKPHOS", "BILITOT", "PROT", "ALBUMIN" in the last 72 hours.  No results for input(s): "LIPASE", "AMYLASE" in the last 72 hours. Cardiac Enzymes No results for input(s): "CKTOTAL", "CKMB", "CKMBINDEX", "TROPONINI" in the last 72 hours.   BNP: BNP (last 3 results) Recent Labs    04/02/22 1630  BNP 2,272.6*    ProBNP (last 3 results) No results for input(s): "PROBNP" in the last 8760 hours.   D-Dimer No results for input(s): "DDIMER" in the last 72 hours. Hemoglobin A1C No results for input(s): "HGBA1C" in the last 72 hours.  Fasting Lipid Panel No results for input(s): "CHOL", "HDL", "LDLCALC", "TRIG", "CHOLHDL", "LDLDIRECT" in the last 72 hours.  Thyroid Function Tests No  results for input(s): "TSH", "T4TOTAL", "T3FREE", "THYROIDAB" in the last 72 hours.  Invalid input(s): "FREET3"   Other results:   Imaging   No results found.  Medications:     Scheduled Medications:  amLODipine  10 mg Oral Daily   atorvastatin  40 mg Oral Daily   carvedilol  6.25 mg Oral BID WC   empagliflozin  10 mg Oral Daily   feeding supplement  237 mL Oral BID BM   heparin  5,000 Units Subcutaneous Q8H   isosorbide-hydrALAZINE  2 tablet Oral TID   multivitamin with minerals  1 tablet Oral Daily   spironolactone  25 mg Oral Daily     Infusions:    PRN Medications: acetaminophen, aspirin-acetaminophen-caffeine, zolpidem  Patient Profile  37 y/o female w/ h/o HTN, obesity, chronic IDA and uterine fibroids admitted w/ hypertensive emergency, acute systolic heart failure and renal insufficiency.   Assessment/Plan  1. Acute Systolic Heart Failure - New diagnosis. 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 No ischemic CP. HS trop not c/w ACS. No family h/o HF  - cMRI - Mild-Mod LVH Diffuse HK EF 35%, RV mildly reduced 45%. No myocardial LGE. HIV NR   - Volume status stable.  Hold off on entresto with creatinine 2.  - Continue Bidil 2 tab tid - Continue coreg 6.25 BID - Continue  jardiance 10 mg daily -Continue  spiro  25 mg daily.  - Holding off on ACE/ARB/ARNI/MRA for now with CKD. With refractory hyperkalemia  _ renal function improving.    2. Hypertensive Emergency  - reports h/o gestational HTN but stopped meds 3 months postpartum w/ reported improved BP control  - BP 200/133 on admit  - associated unexplained hypokalemia raises concern for secondary hypertension, hyperaldosteronism.  -  Plasma metanephrines- pending  - renal US (-) - additional GDMT w/ ARB/ARNi once renal fx improves. Management as above - outpatient sleep study to r/o OSA - Due to cost will split bidil -->hydralazine 75 mg tid and imdur 60 twice daily, coreg 6.25 mg twice a day, amlodipine 10 mg daily, and spiro to 25 mg daily . - Discussed avoiding pregnancy.    3. CKD IV - Unsure if acute vs chronic, last SCr on file was from 2020 (SCr 0.79) - SCr 2.62 on admit, 2.0 today. ANA + RF 70  - ? Hypertensive nephropathy  - Renal u/s with chronic disease - UA with proteinuria but c/b by active menses - Hgb A1c 5.1 - Discussed with Dr Johnney Ou. Plan for biopsy down the road. Dr Johnney Ou plans to arrange at her follow up.   4. IDA - h/o heavy vaginal bleeding w/ h/o uterine fibroids - Hgb  9.7 -> 9.1 > 8.5>9.1  - Ferritin 20, Tsat 12% - Rec'd Feraheme this am  - PO Fe at d/c per primary team    5. Hypokalemia/Hypomagnesemia -Increase spiro to 25 mg daily.  - K 3.6  - w/u to exclude hyperaldosteronism - On spiro.    6. Left Pleural Effusion  - mod size noted on 2D echo and CXR - On room air.   7. Hyperlipidemia - LDL 101, goal <55 - on statin   Ok for discharge.  Due to cost split bidil to hydralazine 75 mg tid and imdur 60 twice daily Coreg 6.25 mg twice a day, Amlodipine 10 mg daily Spiro to 25 mg daily  Jardiance 10 mg daily  Lipitor 40 mg daily  Follow up next week in the HF clinic.    Length of Stay: [place], NP  04/08/2022, 11:20 AM  Advanced Heart Failure Team Pager [phone] (M-F; 7a - 5p)  Please contact [facility] for night-coverage after hours (5p -7a ) and weekends on [email or web address]  Patient seen and examined with the above-signed Advanced Practice Provider and/or Housestaff. I personally reviewed laboratory data, imaging studies and relevant notes. I independently examined the patient and formulated the important aspects of the plan. I have edited the note to reflect any of my changes or salient points. I have personally discussed the plan with the patient and/or family.  Feels better. Denies CP or SOB. BP and renal function improved  General:  Well appearing. No resp difficulty HEENT: normal Neck: supple. no JVD. Carotids 2+ bilat; no bruits. No lymphadenopathy or thryomegaly appreciated. Cor: PMI nondisplaced. Regular rate & rhythm. No rubs, gallops or murmurs. Lungs: clear Abdomen: obese soft, nontender, nondistended. No hepatosplenomegaly. No bruits or masses. Good bowel sounds. Extremities: no cyanosis, clubbing, rash, edema Neuro: alert & orientedx3, cranial nerves grossly intact. moves all 4 extremities w/o difficulty. Affect pleasant  She is much improved. Stable for d/c today. Will need close f/u with [place] and Renal.   Glori Bickers, MD  7:43 PM

## 2022-04-08 NOTE — Progress Notes (Signed)
Pt received HF book and was educated on LE edema and other concerning s/s, recording daily weights and when to call provider, sodium reduction (printout provided), importance of taking meds and physical activity, and fluid and restriction. Will refer to [facility].   Christen Bame 04/08/2022 12:41 PM

## 2022-04-09 LAB — ANCA PROFILE
Anti-MPO Antibodies: <0.2 units (ref 0.0–0.9)
Anti-PR3 Antibodies: <0.2 units (ref 0.0–0.9)
Atypical P-ANCA titer: <1:20 {titer}
C-ANCA: <1:20 {titer}
P-ANCA: <1:20 {titer}

## 2022-04-09 LAB — ANTIEXTRACTABLE NUCLEAR AG
ENA SM Ab Ser-aCnc: <0.2 AI (ref 0.0–0.9)
Ribonucleic Protein: 1.1 AI — ABNORMAL HIGH (ref 0.0–0.9)

## 2022-04-10 LAB — PROTEIN ELECTROPHORESIS, SERUM
A/G Ratio: 0.9 (ref 0.7–1.7)
Albumin ELP: 3 g/dL (ref 2.9–4.4)
Alpha-1-Globulin: 0.3 g/dL (ref 0.0–0.4)
Alpha-2-Globulin: 0.8 g/dL (ref 0.4–1.0)
Beta Globulin: 1 g/dL (ref 0.7–1.3)
Gamma Globulin: 1.3 g/dL (ref 0.4–1.8)
Globulin, Total: 3.4 g/dL (ref 2.2–3.9)
Total Protein ELP: 6.4 g/dL (ref 6.0–8.5)

## 2022-04-14 ENCOUNTER — Telehealth (HOSPITAL_COMMUNITY): Payer: Self-pay | Admitting: Licensed Clinical Social Worker

## 2022-04-14 LAB — ALDOSTERONE + RENIN ACTIVITY W/ RATIO
ALDO / PRA Ratio: 10.3 (ref 0.0–30.0)
Aldosterone: 8.5 ng/dL (ref 0.0–30.0)
PRA LC/MS/MS: 0.822 ng/mL/hr (ref 0.167–5.380)

## 2022-04-14 NOTE — Telephone Encounter (Signed)
Pt called CSW to help ensure that FMLA paperwork was received.  Pt emailed CSW the paperwork and it was placed in disability folder for completion.  Jorge Ny, LCSW Clinical Social Worker [facility] Desk#: [phone] Cell#: [phone]

## 2022-04-15 LAB — METANEPHRINES, PLASMA
Metanephrine, Free: 62 pg/mL (ref 0.0–88.0)
Normetanephrine, Free: 454.8 pg/mL — ABNORMAL HIGH (ref 0.0–210.1)

## 2022-04-17 ENCOUNTER — Telehealth (HOSPITAL_COMMUNITY): Payer: Self-pay

## 2022-04-17 NOTE — Telephone Encounter (Signed)
Called patient to see if she is interested in the Cardiac Rehab Program. Patient expressed interest. Explained scheduling process and went over insurance, patient verbalized understanding. Will contact patient for scheduling once f/u has been completed.  

## 2022-04-17 NOTE — Telephone Encounter (Signed)
Pt insurance is active and benefits verified through [facility]. Co-pay $0.00, DED $3,000.00/$3,000.00 met, out of pocket $6,500.00/$3,223.52 met, co-insurance 30%. No pre-authorization required. Passport, 04/17/2022 @ 2:05PM, [id number]   How many CR sessions are covered? (36 sessions for TCR, 72 sessions for ICR)72 Is this a lifetime maximum or an annual maximum? Lifetime Has the member used any of these services to date? No Is there a time limit (weeks/months) on start of program and/or program completion? No     Will contact patient to see if she is interested in the Cardiac Rehab Program. If interested, patient will need to complete follow up appt. Once completed, patient will be contacted for scheduling upon review by the RN Navigator.

## 2022-04-17 NOTE — Telephone Encounter (Signed)
Called to confirm/remind patient of their appointment at the [facility] on 04/18/22.   Patient reminded to bring all medications and/or complete list.  Confirmed patient has transportation. Gave directions, instructed to utilize [facility] parking.  Confirmed appointment prior to ending call.

## 2022-04-18 ENCOUNTER — Encounter (HOSPITAL_COMMUNITY): Payer: Self-pay

## 2022-04-18 ENCOUNTER — Ambulatory Visit (HOSPITAL_COMMUNITY)
Admit: 2022-04-18 | Discharge: 2022-04-18 | Disposition: A | Discharge status: Home / Self Care | Payer: 59 | Attending: Family Medicine | Admitting: Family Medicine

## 2022-04-18 VITALS — BP 130/82 | HR 106 | Wt 225.0 lb

## 2022-04-18 DIAGNOSIS — J90 Pleural effusion, not elsewhere classified: Secondary | ICD-10-CM | POA: Diagnosis not present

## 2022-04-18 DIAGNOSIS — Z79899 Other long term (current) drug therapy: Secondary | ICD-10-CM | POA: Insufficient documentation

## 2022-04-18 DIAGNOSIS — E669 Obesity, unspecified: Secondary | ICD-10-CM | POA: Diagnosis not present

## 2022-04-18 DIAGNOSIS — E875 Hyperkalemia: Secondary | ICD-10-CM | POA: Insufficient documentation

## 2022-04-18 DIAGNOSIS — D509 Iron deficiency anemia, unspecified: Secondary | ICD-10-CM | POA: Diagnosis not present

## 2022-04-18 DIAGNOSIS — Z7984 Long term (current) use of oral hypoglycemic drugs: Secondary | ICD-10-CM | POA: Insufficient documentation

## 2022-04-18 DIAGNOSIS — Z86018 Personal history of other benign neoplasm: Secondary | ICD-10-CM | POA: Insufficient documentation

## 2022-04-18 DIAGNOSIS — R0609 Other forms of dyspnea: Secondary | ICD-10-CM | POA: Diagnosis not present

## 2022-04-18 DIAGNOSIS — I5022 Chronic systolic (congestive) heart failure: Secondary | ICD-10-CM

## 2022-04-18 DIAGNOSIS — E785 Hyperlipidemia, unspecified: Secondary | ICD-10-CM

## 2022-04-18 DIAGNOSIS — I130 Hypertensive heart and chronic kidney disease with heart failure and stage 1 through stage 4 chronic kidney disease, or unspecified chronic kidney disease: Secondary | ICD-10-CM | POA: Insufficient documentation

## 2022-04-18 DIAGNOSIS — N184 Chronic kidney disease, stage 4 (severe): Secondary | ICD-10-CM | POA: Diagnosis not present

## 2022-04-18 DIAGNOSIS — R400 Somnolence: Secondary | ICD-10-CM

## 2022-04-18 DIAGNOSIS — R5383 Other fatigue: Secondary | ICD-10-CM | POA: Insufficient documentation

## 2022-04-18 DIAGNOSIS — I10 Essential (primary) hypertension: Secondary | ICD-10-CM

## 2022-04-18 DIAGNOSIS — I2510 Atherosclerotic heart disease of native coronary artery without angina pectoris: Secondary | ICD-10-CM | POA: Insufficient documentation

## 2022-04-18 LAB — BASIC METABOLIC PANEL
Anion gap: 10 (ref 5–15)
BUN: 17 mg/dL (ref 6–20)
CO2: 21 mmol/L — ABNORMAL LOW (ref 22–32)
Calcium: 9 mg/dL (ref 8.9–10.3)
Chloride: 103 mmol/L (ref 98–111)
Creatinine, Ser: 2.18 mg/dL — ABNORMAL HIGH (ref 0.44–1.00)
GFR, Estimated: 30 mL/min — ABNORMAL LOW (ref >60)
Glucose, Bld: 94 mg/dL (ref 70–99)
Potassium: 3.9 mmol/L (ref 3.5–5.1)
Sodium: 134 mmol/L — ABNORMAL LOW (ref 135–145)

## 2022-04-18 LAB — BRAIN NATRIURETIC PEPTIDE: B Natriuretic Peptide: 70.6 pg/mL (ref 0.0–100.0)

## 2022-04-18 MED ORDER — CARVEDILOL 12.5 MG PO TABS: 12.5000 mg | ORAL_TABLET | Freq: Two times a day (BID) | ORAL | 3 refills | Status: DC

## 2022-04-18 NOTE — Patient Instructions (Addendum)
INCREASE Coreg to 12.5 mg one tab twice a day  Your provider has recommended that you have a home sleep study (Itamar Test).  We have provided you with the equipment in our office today. Please go ahead and download the app. DO NOT OPEN OR TAMPER WITH THE BOX UNTIL WE ADVISE YOU TO DO SO. Once insurance has approved the test our office will call you with PIN number and approval to proceed with testing. Once you have completed the test you just dispose of the equipment, the information is automatically uploaded to [place] via blue-tooth technology. If your test is positive for sleep apnea and you need a home CPAP machine you will be contacted by Dr Theodosia Blender office [facility]) to set this up.  Labs today We will only contact you if something comes back abnormal or we need to make some changes. Otherwise no news is good news!  Your physician recommends that you schedule a follow-up appointment in: 4 weeks  in the Advanced Practitioners (PA/NP) Clinic  And in 3-4 months with Dr Aundra Dubin and echo  Your physician has requested that you have an echocardiogram. Echocardiography is a painless test that uses sound waves to create images of your heart. It provides your doctor with information about the size and shape of your heart and how well your heart's chambers and valves are working. This procedure takes approximately one hour. There are no restrictions for this procedure. Please do NOT wear cologne, perfume, aftershave, or lotions (deodorant is allowed). Please arrive 15 minutes prior to your appointment time.   Do the following things EVERYDAY: Weigh yourself in the morning before breakfast. Write it down and keep it in a log. Take your medicines as prescribed Eat low salt foods--Limit salt (sodium) to 2000 mg per day.  Stay as active as you can everyday Limit all fluids for the day to less than 2 liters  At the [facility], you and your health needs are our priority. As part of  our continuing mission to provide you with exceptional heart care, we have created designated Provider Care Teams. These Care Teams include your primary Cardiologist (physician) and Advanced Practice Providers (APPs- Physician Assistants and Nurse Practitioners) who all work together to provide you with the care you need, when you need it.   You may see any of the following providers on your designated Care Team at your next follow up: Dr Glori Bickers Dr Loralie Champagne Dr. Roxana Hires, NP Lyda Jester, [place] [facility] [place], [place] Forestine Na, NP Audry Riles, PharmD   Please be sure to bring in all your medications bottles to every appointment.    Thank you for choosing [facility]   If you have any questions or concerns before your next appointment please send [place] a message through [facility] or call our office at [phone].    TO LEAVE A MESSAGE FOR THE NURSE SELECT OPTION 2, PLEASE LEAVE A MESSAGE INCLUDING: YOUR NAME DATE OF BIRTH CALL BACK NUMBER REASON FOR CALL**this is important as we prioritize the call backs  YOU WILL RECEIVE A CALL BACK THE SAME DAY AS LONG AS YOU CALL BEFORE 4:00 PM

## 2022-04-18 NOTE — Progress Notes (Signed)
ADVANCED HF CLINIC CONSULT NOTE  Primary Care: [facility] Cardiologist: Dr. Aundra Dubin  HPI: 37 y.o.female w/ h/o HTN, obesity, chronic IDA and uterine fibroids. No prior cardiac history. 2 prior pregnancies. Last delivery was in 2021. Reports she had hypertension during pregnancy and treated w/ meds that continued up to 3 months postpartum, then stopped as she reports BP was better controlled.    She was flu +12/23, since then has struggled with persistent cough. Received treatment for suspected "bronchitis" w/o improvement. Then developed exertional dyspnea, orthopnea/PND, LEE and pleuritic CP. Symptoms progressively worsened, prompting her to come to ED.   Admitted 2/24 with a/c CHF and hypertensive urgency. Diuresed with IV lasix, nitro gtt. Echo showed EF 20-25%, global HK, no LVH, RV normal. + mod-large left pleural effusion. IVC not dilated. Renal US  No hydronephrosis. Increased parenchymal echogenicity which can be seen in the setting of medical renal disease. Bilateral pleural effusions. Drips weaned and GDMT titrated but limited by elevated SCr. cMRI showed Mild-Mod LVH Diffuse HK EF 35%, RV mildly reduced 45%. No myocardial LGE. She was discharged home, weight 235 lbs.  Today she returns for post hospital HF follow up. Overall feeling fine. Has noticed palpitations, fitbit shows HR up to 115 when walking. She has dyspnea with showering or walking further distances on flat ground. Continues with fatigue. 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 She has 2 children, (67 and 39 years old). Denies abnormal bleeding, CP, dizziness, edema, or PND/Orthopnea. Appetite ok. No fever or chills. Weight at home 222 pounds. Taking all medications. She does not snore.  In lab sleep study 2018 that was negative  ECG (personally reviewed): ST inferior TWI, 85 bpm  Labs (2/24): K 3.6, creatinine 2.04, Hgb 8.7,  ANA + RF 70    Cardiac Studies  - cMRI (2/24): small R pleural effusion, small  pericardial effusion, LVEF 35% with mild to moderate concentric LVH, RVEF 45%, no LGE.  - Echo (2/24): EF 20-25%, RV normal, moderate to large L pleural effusion.  Review of Systems: [y] = yes, [ ]$  = no   General: Weight gain [ ]$ ; Weight loss Blue.Reese ]; Anorexia [ ]$ ; Fatigue [ y]; Fever [ ]$ ; Chills [ ]$ ; Weakness [ ]$   Cardiac: Chest pain/pressure [ ]$ ; Resting SOB [ ]$ ; Exertional SOB [ y]; Orthopnea [ ]$ ; Pedal Edema [ ]$ ; Palpitations Blue.Reese ]; Syncope [ ]$ ; Presyncope [ ]$ ; Paroxysmal nocturnal dyspnea[ ]$   Pulmonary: Cough [ ]$ ; Wheezing[ ]$ ; Hemoptysis[ ]$ ; Sputum [ ]$ ; Snoring [ ]$   GI: Vomiting[ ]$ ; Dysphagia[ ]$ ; Melena[ ]$ ; Hematochezia [ ]$ ; Heartburn[ ]$ ; Abdominal pain [ ]$ ; Constipation [ ]$ ; Diarrhea [ ]$ ; BRBPR [ ]$   GU: Hematuria[ ]$ ; Dysuria [ ]$ ; Nocturia[ ]$   Vascular: Pain in legs with walking [ ]$ ; Pain in feet with lying flat [ ]$ ; Non-healing sores [ ]$ ; Stroke [ ]$ ; TIA [ ]$ ; Slurred speech [ ]$ ;  Neuro: Headaches[ ]$ ; Vertigo[ ]$ ; Seizures[ ]$ ; Paresthesias[ ]$ ;Blurred vision [ ]$ ; Diplopia [ ]$ ; Vision changes [ ]$   Ortho/Skin: Arthritis [ ]$ ; Joint pain [ ]$ ; Muscle pain [ ]$ ; Joint swelling [ ]$ ; Back Pain [ ]$ ; Rash [ ]$   Psych: Depression[ ]$ ; Anxiety[ ]$   Heme: Bleeding problems [ ]$ ; Clotting disorders [ ]$ ; Anemia [ ]$   Endocrine: Diabetes [ ]$ ; Thyroid dysfunction[ ]$   Past Medical History:  Diagnosis Date   Anemia    Fibroid    Fibroids    Headache    Current  Outpatient Medications  Medication Sig Dispense Refill   albuterol (VENTOLIN HFA) 108 (90 Base) MCG/ACT inhaler Inhale 1 puff into the lungs every 6 (six) hours as needed for wheezing or shortness of breath.     amLODipine (NORVASC) 10 MG tablet Take 1 tablet (10 mg total) by mouth daily. 30 tablet 0   atorvastatin (LIPITOR) 40 MG tablet Take 1 tablet (40 mg total) by mouth daily. 30 tablet 0   carvedilol (COREG) 6.25 MG tablet Take 1 tablet (6.25 mg total) by mouth 2 (two) times daily with a meal. 60 tablet 0   empagliflozin (JARDIANCE) 10 MG TABS tablet  Take 1 tablet (10 mg total) by mouth daily. 30 tablet 0   fexofenadine (ALLEGRA) 180 MG tablet Take 180 mg by mouth daily as needed for allergies or rhinitis.     fluticasone (FLONASE) 50 MCG/ACT nasal spray Place 1 spray into both nostrils daily as needed for allergies or rhinitis.     hydrALAZINE (APRESOLINE) 25 MG tablet Take 3 tablets (75 mg total) by mouth 3 (three) times daily. 270 tablet 0   isosorbide mononitrate (IMDUR) 60 MG 24 hr tablet Take 1 tablet (60 mg total) by mouth 2 (two) times daily. 60 tablet 0   spironolactone (ALDACTONE) 25 MG tablet Take 1 tablet (25 mg total) by mouth daily. 30 tablet 0   promethazine (PHENERGAN) 25 MG tablet Take 25 mg by mouth every 6 (six) hours as needed for nausea or vomiting. (Patient not taking: Reported on 04/18/2022)     No current facility-administered medications for this encounter.   Facility-Administered Medications Ordered in Other Encounters  Medication Dose Route Frequency Provider Last Rate Last Admin   gadopentetate dimeglumine (MAGNEVIST) injection 20 mL  20 mL Intravenous Once PRN Melvenia Beam, MD       Allergies  Allergen Reactions   Amoxicillin Other (See Comments)    Digestive issues   Social History   Socioeconomic History   Marital status: Married    Spouse name: Not on file   Number of children: 0   Years of education: 16   Highest education level: Not on file  Occupational History   Occupation: [employer]  Tobacco Use   Smoking status: Never   Smokeless tobacco: Never  Substance and Sexual Activity   Alcohol use: Yes    Alcohol/week: 0.0 standard drinks of alcohol    Comment: Socially   Drug use: No   Sexual activity: Yes    Birth control/protection: None  Other Topics Concern   Not on file  Social History Narrative   Lives at home with boyfriend   Caffeine use: Drinks tea (4 cups per week)   No soda/coffee   Social Determinants of Health   Financial Resource Strain: Low Risk  (01/21/2019)    Overall Financial Resource Strain (CARDIA)    Difficulty of Paying Living Expenses: Not hard at all  Food Insecurity: No Food Insecurity (04/03/2022)   Hunger Vital Sign    Worried About Running Out of Food in the Last Year: Never true    Ran Out of Food in the Last Year: Never true  Transportation Needs: No Transportation Needs (04/03/2022)   PRAPARE - Hydrologist (Medical): No    Lack of Transportation (Non-Medical): No  Physical Activity: Insufficiently Active (01/21/2019)   Exercise Vital Sign    Days of Exercise per Week: 3 days    Minutes of Exercise per Session: 30 min  Stress: No  Stress Concern Present (01/21/2019)   [place]    Feeling of Stress : Not at all  Social Connections: Moderately Integrated (01/21/2019)   Social Connection and Isolation Panel [NHANES]    Frequency of Communication with Friends and Family: More than three times a week    Frequency of Social Gatherings with Friends and Family: More than three times a week    Attends Religious Services: More than 4 times per year    Active Member of [employer] or Organizations: No    Attends Archivist Meetings: Never    Marital Status: Married  Human resources officer Violence: Not At Risk (04/03/2022)   Humiliation, Afraid, Rape, and Kick questionnaire    Fear of Current or Ex-Partner: No    Emotionally Abused: No    Physically Abused: No    Sexually Abused: No   Family History  Problem Relation Age of Onset   Migraines Father    Stroke Mother    Depression Mother    Lupus Maternal Aunt    Stroke Maternal Uncle    BP 130/82   Pulse (!) 106   Wt 102.1 kg (225 lb)   LMP 03/09/2022   SpO2 100%   BMI 37.44 kg/m   Wt Readings from Last 3 Encounters:  04/18/22 102.1 kg (225 lb)  04/08/22 103 kg (227 lb 1.6 oz)  01/21/19 122 kg (269 lb)   PHYSICAL EXAM: General:  NAD. No resp difficulty,  fatigued-appearing. HEENT: Normal Neck: Supple. No JVD. Carotids 2+ bilat; no bruits. No lymphadenopathy or thryomegaly appreciated. Cor: PMI nondisplaced. Regular rate & rhythm. No rubs, gallops or murmurs. Lungs: Clear Abdomen: Soft, nontender, nondistended. No hepatosplenomegaly. No bruits or masses. Good bowel sounds. Extremities: No cyanosis, clubbing, rash, edema Neuro: Alert & oriented x 3, cranial nerves grossly intact. Moves all 4 extremities w/o difficulty. Affect pleasant.  ASSESSMENT & PLAN: 1. Chronic Systolic Heart Failure: New diagnosis. Admitted 2/24 with acute CHF and HTN urgency. Echo (2/24): EF 20-25%, RV normal. Suspect most likely HTN CM vs possible viral CM (had Flu 12/23). No ischemic CP. No family h/o HF. cMRI (2/24) showed mild-mod LVH,LVEF 35% with diffuse HK, RV mildly reduced 45%. No myocardial LGE. Today, she has NYHA II-early III symptoms, she is not volume overloaded on exam, weight down 12 lbs from discharge. GDMT limited by CKD. - Increase Coreg to 12.5 mg bid. - Continue Jardiance 10 mg daily. No GU symptoms. - Continue spiro 25 mg daily. BMET and BNP today. - Continue hydralazine 75 mg tid + Imdur 60 mg daily. Discussed Tylenol PRN for HA. - Holding off on ACE/ARB/ARNI for now with CKD. With refractory hyperkalemia. - Needs to avoid pregnancy, has hormonal implant. - OK to start CR. - Repeat echo in 3 months. 2. HTN: Reports h/o gestational HTN but stopped meds 3 months postpartum w/ reported improved BP control  - BP 200/133 on admit, associated unexplained hypokalemia raises concern for secondary hypertension, hyperaldosteronism. Normetanephrines elevated, metanephrine normal, renal US (-). - BP better controlled today. - Increase Coreg as above. If able to add ARB/ARNi will consider stopping amlodipine. - Arrange home sleep study. 3. CKD IV: last SCr on file was from 2020 (SCr 0.79). Recent admit with SCr up to 2.62. ANA + RF 70. Renal u/s with chronic  disease. ? Hypertensive nephropathy.  - Plan for biopsy down the road. She has follow up with Dr. Deeann Saint. - Continue Jardiance. BMET today. 4. IDA: h/o  heavy vaginal bleeding w/ h/o uterine fibroids. Ferritin 20, Tsat 12%-->rec'd Feraheme this admit. - Continue oral iron suppl. 5. Left Pleural Effusion: Moderate on echo and CXR. Lungs clear on exam today, 100% oxygen sat on room air.  6. Hyperlipidemia: LDL 101, goal <55 - Continue atorvastatin 40 mg daily. Repeat LFTs/Lipids in 6-8 weeks.   Follow up in 4 weeks with APP (consider Zio if continues with palps) and 12 weeks with Dr. Aundra Dubin + echo.  Allena Katz, FNP-BC 04/18/22

## 2022-04-21 ENCOUNTER — Telehealth: Payer: Self-pay | Admitting: *Deleted

## 2022-04-21 ENCOUNTER — Other Ambulatory Visit (HOSPITAL_COMMUNITY): Payer: Self-pay

## 2022-04-21 NOTE — Telephone Encounter (Signed)
No pre cert reqd for [facility]. Left detailed vm that pt can complete study.

## 2022-04-21 NOTE — Discharge Summary (Signed)
Physician Discharge Summary  Maria Osborn [id number] DOB: January 19, 1986 DOA: 04/02/2022  PCP: [facility]  Admit date: 04/02/2022 Discharge date: 04/08/2022  Time spent: 45 minutes  Recommendations for Outpatient Follow-up:  PCP in 1 week CHF clinic 2/16 Please FU Renin-Aldo level  Discharge Diagnoses:  Principal Problem:   Acute systolic CHF (congestive heart failure) (HCC) Active Problems:   Hypertensive urgency   Hypokalemia   Microcytic anemia   Elevated troponin   AKI (acute kidney injury) ([place])   Hypomagnesemia   Discharge Condition: stable  Diet recommendation: low sodium  Filed Weights   04/06/22 0544 04/07/22 0427 04/08/22 0618  Weight: 103.4 kg 104.5 kg 103 kg    History of present illness:  [name] with history of hypertension in the setting of pregnancy, obesity, chronic iron deficiency anemia, uterine fibroids presented to the ED with dyspnea on exertion. In the ED, volume overloaded, BP 190s, potassium 2.6, creatinine 2.62, hemoglobin 9.8, MCV 70.7, troponin 87, and BNP 2273.  -admitted, started on diuretics, ECho w/ EF 20-25%  Hospital Course:   Acute systolic congestive heart failure: New diagnosis, echo shows EF 20-25%. -Suspected to be hypertensive  -Diuresed with IV Lasix, 8.8 L negative, weight down, now appears euvolemic  -continue hydralazine, nitrate, amlodipine, Coreg -GDMT limited by CKD, started on Jardiance as well and Aldactone -Advanced heart failure team following -Cardiac MRI, completed, EF 35%, mildly reduced RV, no myocardial LGE -Follow-up renin aldosterone level, aldosterone, metanephrines   Renal insufficiency Acute versus chronic:Previous creatinine 2020 was 0.7, on admission 2.6.   -suspect hypertensive, renal ultrasound suggestive of medical renal disease, no hydronephrosis -Renal artery duplex negative for stenosis  -Urine protein elevated at 3.2 g may not be accurate with current menstruation, needs  to be repeated -Appreciate nephrology input renin, aldosterone and metanephrines pending -Serologies sent -Creatinine with mild improvement, 2.2 today -Follow-up with Dr. Johnney Ou, plans to consider biopsy down the road if kidney function does not improve   Hypertensive emergency:  -presented with 190/130 blood pressure. -Now improving, continue BiDil, amlodipine, diuretics -Renal artery duplex negative for stenosis, renal ultrasound suggestive of chronic disease -Renin aldosterone, metanephrines pending   IDA with history of vaginal bleeding/uterine fibroids: - ferritin 20 saturation 12%, given IV iron 2/4, MRI completed -Hemoglobin now 9.1   Elevated troponin suspect demand ischemia -From CHF, no ACS   Hypokalemia Hypomagnesemia: -repleted, Renin-aldo pending   Class II Obesity:Patient's Body mass index is 38.34 kg/m. : Will benefit with PCP follow-up, weight loss  healthy lifestyle       Discharge Exam: Vitals:   04/08/22 0618 04/08/22 0900  BP: (!) 151/97 (!) 140/82  Pulse: 95 90  Resp: 20 18  Temp: 97.8 F (36.6 C)   SpO2: 100% 97%    Gen: Awake, Alert, Oriented X 3,  HEENT: no JVD Lungs: Good air movement bilaterally, CTAB CVS: S1S2/RRR Abd: soft, Non tender, non distended, BS present Extremities: No edema Skin: no new rashes on exposed skin   Discharge Instructions   Discharge Instructions     Amb Referral to Cardiac Rehabilitation   Complete by: As directed    Diagnosis: Heart Failure (see criteria below if ordering Phase II)   Heart Failure Type: Chronic Systolic   After initial evaluation and assessments completed: Virtual Based Care may be provided alone or in conjunction with Phase 2 Cardiac Rehab based on patient barriers.: Yes   Intensive Cardiac Rehabilitation (ICR) [place] location only OR Traditional Cardiac Rehabilitation (TCR) *If criteria for ICR  are not met will enroll in TCR [facility] only): Yes   Diet - low sodium heart healthy   Complete by: As  directed    Increase activity slowly   Complete by: As directed       Allergies as of 04/08/2022       Reactions   Amoxicillin Other (See Comments)   Digestive issues        Medication List     STOP taking these medications    Hydromet 5-1.5 MG/5ML syrup Generic drug: HYDROcodone bit-homatropine   labetalol 100 MG tablet Commonly known as: NORMODYNE       TAKE these medications    albuterol 108 (90 Base) MCG/ACT inhaler Commonly known as: VENTOLIN HFA Inhale 1 puff into the lungs every 6 (six) hours as needed for wheezing or shortness of breath.   amLODipine 10 MG tablet Commonly known as: NORVASC Take 1 tablet (10 mg total) by mouth daily.   atorvastatin 40 MG tablet Commonly known as: LIPITOR Take 1 tablet (40 mg total) by mouth daily.   fexofenadine 180 MG tablet Commonly known as: ALLEGRA Take 180 mg by mouth daily as needed for allergies or rhinitis.   fluticasone 50 MCG/ACT nasal spray Commonly known as: FLONASE Place 1 spray into both nostrils daily as needed for allergies or rhinitis.   hydrALAZINE 25 MG tablet Commonly known as: APRESOLINE Take 3 tablets (75 mg total) by mouth 3 (three) times daily.   isosorbide mononitrate 60 MG 24 hr tablet Commonly known as: IMDUR Take 1 tablet (60 mg total) by mouth 2 (two) times daily.   Jardiance 10 MG Tabs tablet Generic drug: empagliflozin Take 1 tablet (10 mg total) by mouth daily.   promethazine 25 MG tablet Commonly known as: PHENERGAN Take 25 mg by mouth every 6 (six) hours as needed for nausea or vomiting.   spironolactone 25 MG tablet Commonly known as: ALDACTONE Take 1 tablet (25 mg total) by mouth daily.       Allergies  Allergen Reactions   Amoxicillin Other (See Comments)    Digestive issues    Follow-up Information     [facility] Follow up on 04/18/2022.   Specialty: Cardiology Why: at 9:30. Located on the 1st floor of the  hospital. Contact information: [address] [id number] [place] [id number] [place] Follow up.   Why: Please follow up in a week. Contact information: [place] [address] [phone]                  The results of significant diagnostics from this hospitalization (including imaging, microbiology, ancillary and laboratory) are listed below for reference.    Significant Diagnostic Studies: MR CARDIAC VELOCITY FLOW MAP  Result Date: 04/07/2022 CLINICAL DATA:  Cardiomyopathy of uncertain etiology EXAM: CARDIAC MRI TECHNIQUE: The patient was scanned on a 1.5 Tesla GE magnet. A dedicated cardiac coil was used. Functional imaging was done using Fiesta sequences. 2,3, and 4 chamber views were done to assess for RWMA's. Modified Simpson's rule using a short axis stack was used to calculate an ejection fraction on a dedicated work Conservation officer, nature. The patient received 8 cc of Gadavist. After 10 minutes inversion recovery sequences were used to assess for infiltration and scar tissue. FINDINGS: Trivial left pleural effusion, small-moderate right pleural effusion. Small pericardial effusion. Mildly dilated left ventricle with mild to  moderate concentric LV hypertrophy. Diffuse hypokinesis, LV EF 35%. Normal right ventricular size with mildly decreased systolic function, [id number]. Mild biatrial enlargement. Trileaflet aortic valve with no significant stenosis or regurgitation. Mild mitral regurgitation with regurgitant fraction 11%. First pass perfusion appears normal. On delayed enhancement imaging, there was no myocardial late gadolinium enhancement (LGE). MEASUREMENTS: MEASUREMENTS LVEDV 228 mL LVEDVi 105 mL/m2 LVSV 80 mL LVEF 35% RVEDV 179 mL RVEDVi 82 mL/m2 RVSV 81 mL RVEF 45% T1 1125, unable to calculate ECV percentage as post-contrast T1 images not done. Aortic forward volume 71 mL Aortic  regurgitant fraction 4% IMPRESSION: 1. Small-moderate right pleural effusion, small pericardial effusion. 2. Mild LV dilation with mild-moderate concentric LV hypertrophy. Diffuse hypokinesis with EF 35%. 3.  Normal RV size with mildly decreased systolic function, [id number]. 4. No myocardial LGE, so no definitive evidence for prior MI, myocarditis, or infiltrative disease. 5.  Mildly elevated T1 suggesting a degree of LV fibrosis. Dalton Mclean Electronically Signed   By: Loralie Champagne M.D.   On: 04/07/2022 07:39   MR CARDIAC VELOCITY FLOW MAP  Result Date: 04/07/2022 CLINICAL DATA:  Cardiomyopathy of uncertain etiology EXAM: CARDIAC MRI TECHNIQUE: The patient was scanned on a 1.5 Tesla GE magnet. A dedicated cardiac coil was used. Functional imaging was done using Fiesta sequences. 2,3, and 4 chamber views were done to assess for RWMA's. Modified Simpson's rule using a short axis stack was used to calculate an ejection fraction on a dedicated work Conservation officer, nature. The patient received 8 cc of Gadavist. After 10 minutes inversion recovery sequences were used to assess for infiltration and scar tissue. FINDINGS: Trivial left pleural effusion, small-moderate right pleural effusion. Small pericardial effusion. Mildly dilated left ventricle with mild to moderate concentric LV hypertrophy. Diffuse hypokinesis, LV EF 35%. Normal right ventricular size with mildly decreased systolic function, [id number]. Mild biatrial enlargement. Trileaflet aortic valve with no significant stenosis or regurgitation. Mild mitral regurgitation with regurgitant fraction 11%. First pass perfusion appears normal. On delayed enhancement imaging, there was no myocardial late gadolinium enhancement (LGE). MEASUREMENTS: MEASUREMENTS LVEDV 228 mL LVEDVi 105 mL/m2 LVSV 80 mL LVEF 35% RVEDV 179 mL RVEDVi 82 mL/m2 RVSV 81 mL RVEF 45% T1 1125, unable to calculate ECV percentage as post-contrast T1 images not done. Aortic forward volume 71 mL  Aortic regurgitant fraction 4% IMPRESSION: 1. Small-moderate right pleural effusion, small pericardial effusion. 2. Mild LV dilation with mild-moderate concentric LV hypertrophy. Diffuse hypokinesis with EF 35%. 3.  Normal RV size with mildly decreased systolic function, EF [id number]. 4. No myocardial LGE, so no definitive evidence for prior MI, myocarditis, or infiltrative disease. 5.  Mildly elevated T1 suggesting a degree of LV fibrosis. Dalton Mclean Electronically Signed   By: Loralie Champagne M.D.   On: 04/07/2022 07:39   MR CARDIAC MORPHOLOGY W WO CONTRAST  Result Date: 04/07/2022 CLINICAL DATA:  Cardiomyopathy of uncertain etiology EXAM: CARDIAC MRI TECHNIQUE: The patient was scanned on a 1.5 Tesla GE magnet. A dedicated cardiac coil was used. Functional imaging was done using Fiesta sequences. 2,3, and 4 chamber views were done to assess for RWMA's. Modified Simpson's rule using a short axis stack was used to calculate an ejection fraction on a dedicated work Conservation officer, nature. The patient received 8 cc of Gadavist. After 10 minutes inversion recovery sequences were used to assess for infiltration and scar tissue. FINDINGS: Trivial left pleural effusion, small-moderate right pleural effusion. Small pericardial effusion. Mildly dilated left ventricle with mild  to moderate concentric LV hypertrophy. Diffuse hypokinesis, LV EF 35%. Normal right ventricular size with mildly decreased systolic function, [id number]. Mild biatrial enlargement. Trileaflet aortic valve with no significant stenosis or regurgitation. Mild mitral regurgitation with regurgitant fraction 11%. First pass perfusion appears normal. On delayed enhancement imaging, there was no myocardial late gadolinium enhancement (LGE). MEASUREMENTS: MEASUREMENTS LVEDV 228 mL LVEDVi 105 mL/m2 LVSV 80 mL LVEF 35% RVEDV 179 mL RVEDVi 82 mL/m2 RVSV 81 mL RVEF 45% T1 1125, unable to calculate ECV percentage as post-contrast T1 images not done. Aortic  forward volume 71 mL Aortic regurgitant fraction 4% IMPRESSION: 1. Small-moderate right pleural effusion, small pericardial effusion. 2. Mild LV dilation with mild-moderate concentric LV hypertrophy. Diffuse hypokinesis with EF 35%. 3.  Normal RV size with mildly decreased systolic function, EF [id number]. 4. No myocardial LGE, so no definitive evidence for prior MI, myocarditis, or infiltrative disease. 5.  Mildly elevated T1 suggesting a degree of LV fibrosis. Dalton Mclean Electronically Signed   By: Loralie Champagne M.D.   On: 04/07/2022 07:38   VAS US RENAL ARTERY DUPLEX  Result Date: 04/05/2022 ABDOMINAL VISCERAL Patient Name:  ZHAVIA MITCHELL  Date of Exam:   04/04/2022 Medical Rec #: [id number]               Accession #:    [id number] Date of Birth: December 03, 1985              Patient Gender: F Patient Age:   37 years Exam Location:  [facility] Procedure:      VAS US RENAL ARTERY DUPLEX Referring Phys: [id number] [place] -------------------------------------------------------------------------------- Indications: Hypertension Performing Technologist: Darlin Coco RDMS, RVT  Examination Guidelines: A complete evaluation includes B-mode imaging, spectral Doppler, color Doppler, and power Doppler as needed of all accessible portions of each vessel. Bilateral testing is considered an integral part of a complete examination. Limited examinations for reoccurring indications may be performed as noted.  Duplex Findings: +----------------------+--------+--------+------+--------+ Mesenteric            PSV cm/sEDV cm/sPlaqueComments +----------------------+--------+--------+------+--------+ Aorta Mid                86                          +----------------------+--------+--------+------+--------+ Celiac Artery Proximal  236                          +----------------------+--------+--------+------+--------+ SMA Proximal            155                           +----------------------+--------+--------+------+--------+    +------------------+--------+--------+-------+ Right Renal ArteryPSV cm/sEDV cm/sComment +------------------+--------+--------+-------+ Origin               71      20           +------------------+--------+--------+-------+ Proximal             99      32           +------------------+--------+--------+-------+ Mid                 112      33           +------------------+--------+--------+-------+ Distal               57      19           +------------------+--------+--------+-------+ +-----------------+--------+--------+-------+  Left Renal ArteryPSV cm/sEDV cm/sComment +-----------------+--------+--------+-------+ Origin             144      27           +-----------------+--------+--------+-------+ Proximal           140      33           +-----------------+--------+--------+-------+ Mid                 62      20           +-----------------+--------+--------+-------+ Distal              67      22           +-----------------+--------+--------+-------+ +------------+--------+--------+----+-----------+--------+--------+----+ Right KidneyPSV cm/sEDV cm/sRI  Left KidneyPSV cm/sEDV cm/sRI   +------------+--------+--------+----+-----------+--------+--------+----+ Upper Pole  30      11      0.62Upper Pole 15      6       0.58 +------------+--------+--------+----+-----------+--------+--------+----+ Mid         23      9       0.62Mid        29      12      0.59 +------------+--------+--------+----+-----------+--------+--------+----+ Lower Pole  18      6       0.67Lower Pole 18      6       0.66 +------------+--------+--------+----+-----------+--------+--------+----+ Hilar       26      7       0.73Hilar      20      8       0.58 +------------+--------+--------+----+-----------+--------+--------+----+ +------------------+-----+------------------+-----+ Right  Kidney           Left Kidney             +------------------+-----+------------------+-----+ RAR                    RAR                     +------------------+-----+------------------+-----+ RAR (manual)      1.30 RAR (manual)      1.67  +------------------+-----+------------------+-----+ Cortex                 Cortex                  +------------------+-----+------------------+-----+ Cortex thickness       Corex thickness         +------------------+-----+------------------+-----+ Kidney length (cm)12.05Kidney length (cm)11.13 +------------------+-----+------------------+-----+  Summary: Renal:  Right: No evidence of right renal artery stenosis. RRV flow present.        Normal size right kidney. Normal right Resisitive Index.        Increased echogenicity of the renal parenchyma. Left:  No evidence of left renal artery stenosis. LRV flow present.        Normal size of left kidney. Normal left Resistive Index.        Increased echogenicity of the renal parenchyma.  *See table(s) above for measurements and observations.  Diagnosing physician: Orlie Pollen  Electronically signed by Orlie Pollen on 04/05/2022 at 10:32:57 AM.    Final    US RENAL  Result Date: 04/03/2022 CLINICAL DATA:  Acute kidney injury EXAM: RENAL / URINARY TRACT ULTRASOUND COMPLETE COMPARISON:  None Available. FINDINGS: Right Kidney: Renal measurements: 10.3 x 4.4 x 4.6 cm = volume: 109.8 mL. Increased  parenchymal echogenicity. No mass or hydronephrosis visualized. Left Kidney: Renal measurements: 9.8 x 6.9 x 5.8 cm = volume: 105.5 mL. Increased parenchymal echogenicity. No mass or hydronephrosis visualized. Bladder: Appears normal for degree of bladder distention. Other: Bilateral pleural effusions. IMPRESSION: 1. No hydronephrosis. 2. Increased parenchymal echogenicity which can be seen in the setting of medical renal disease. 3. Bilateral pleural effusions. Electronically Signed   By: Yetta Glassman M.D.   On:  04/03/2022 14:09   ECHOCARDIOGRAM COMPLETE  Result Date: 04/03/2022    ECHOCARDIOGRAM REPORT   Patient Name:   Maria Osborn Date of Exam: 04/03/2022 Medical Rec #:  [id number]              Height:       65.0 in Accession #:    [id number]             Weight:       236.0 lb Date of Birth:  [birth year]             BSA:          2.122 m Patient Age:    34 years               BP:           144/91 mmHg Patient Gender: F                      HR:           104 bpm. Exam Location:  Inpatient Procedure: 2D Echo, Color Doppler, Cardiac Doppler and Intracardiac            Opacification Agent STAT ECHO Indications:    CHF - Acute Systolic  History:        Patient has no prior history of Echocardiogram examinations.                 Signs/Symptoms:Shortness of Breath, Dyspnea, Orthopnea and                 Edema; Risk Factors:Hypertension. Iron deficiency anemia.  Sonographer:    Eartha Inch Referring Phys: [id number] TIMOTHY S OPYD  Sonographer Comments: Image acquisition challenging due to patient body habitus and Image acquisition challenging due to respiratory motion. IMPRESSIONS 1. The left ventricle is mildly dilated with global hypokinesis and severely reduced systolic function. Estimated LV ejection fraction is 20-25%. 2. The RV appears normal in size and function. 3. There is mild mitral and tricuspid regurgitation. 4. There is a moderate to large left pleural effusion. FINDINGS  Left Ventricle: Left ventricular ejection fraction, by estimation, is <20%. The left ventricle has severely decreased function. The left ventricle demonstrates global hypokinesis. Definity contrast agent was given IV to delineate the left ventricular endocardial borders. The left ventricular internal cavity size was normal in size. There is no left ventricular hypertrophy. Left ventricular diastolic parameters are consistent with Grade I diastolic dysfunction (impaired relaxation). Right Ventricle: The right ventricular size is normal.  No increase in right ventricular wall thickness. Right ventricular systolic function is normal. Left Atrium: Left atrial size was normal in size. Right Atrium: Right atrial size was normal in size. Pericardium: There is no evidence of pericardial effusion. Mitral Valve: The mitral valve is normal in structure. Mild mitral valve regurgitation. No evidence of mitral valve stenosis. MV peak gradient, 4.1 mmHg. The mean mitral valve gradient is 2.0 mmHg. Tricuspid Valve: The tricuspid valve is normal in structure. Tricuspid valve regurgitation is not demonstrated. No evidence  of tricuspid stenosis. Aortic Valve: The aortic valve is normal in structure. Aortic valve regurgitation is not visualized. No aortic stenosis is present. Pulmonic Valve: The pulmonic valve was normal in structure. Pulmonic valve regurgitation is trivial. No evidence of pulmonic stenosis. Aorta: The aortic root is normal in size and structure. Venous: The inferior vena cava is normal in size with greater than 50% respiratory variability, suggesting right atrial pressure of 3 mmHg. IAS/Shunts: No atrial level shunt detected by color flow Doppler. Additional Comments: There is a large pleural effusion in the left lateral region.  LEFT VENTRICLE PLAX 2D LVIDd:         5.90 cm      Diastology LVIDs:         5.10 cm      LV e' medial:  6.73 cm/s LV PW:         1.10 cm      LV e' lateral: 6.08 cm/s LV IVS:        0.90 cm LVOT diam:     2.10 cm LV SV:         46 LV SV Index:   22 LVOT Area:     3.46 cm  LV Volumes (MOD) LV vol d, MOD A2C: 141.0 ml LV vol d, MOD A4C: 145.0 ml LV vol s, MOD A2C: 89.5 ml LV vol s, MOD A4C: 109.0 ml LV SV MOD A2C:     51.5 ml LV SV MOD A4C:     145.0 ml LV SV MOD BP:      42.0 ml RIGHT VENTRICLE            IVC RV S prime:     8.58 cm/s  IVC diam: 1.90 cm TAPSE (M-mode): 1.1 cm LEFT ATRIUM             Index        RIGHT ATRIUM           Index LA diam:        4.60 cm 2.17 cm/m   RA Area:     10.50 cm LA Vol (A2C):   37.3 ml  17.58 ml/m  RA Volume:   18.20 ml  8.58 ml/m LA Vol (A4C):   71.4 ml 33.64 ml/m LA Biplane Vol: 56.0 ml 26.39 ml/m  AORTIC VALVE LVOT Vmax:   92.70 cm/s LVOT Vmean:  65.000 cm/s LVOT VTI:    0.132 m  AORTA Ao Root diam: 3.10 cm Ao Asc diam:  3.40 cm MITRAL VALVE MV Area VTI:  1.67 cm    SHUNTS MV Peak grad: 4.1 mmHg    Systemic VTI:  0.13 m MV Mean grad: 2.0 mmHg    Systemic Diam: 2.10 cm MV Vmax:      1.01 m/s MV Vmean:     59.0 cm/s MR Peak grad: 52.8 mmHg MR Mean grad: 75.0 mmHg MR Vmax:      363.33 cm/s MR Vmean:     408.0 cm/s Aditya Sabharwal Electronically signed by Hebert Soho Signature Date/Time: 04/03/2022/9:52:25 AM    Final    DG Chest 2 View  Result Date: 04/02/2022 CLINICAL DATA:  Shortness of breath, cough, and chest pain. EXAM: CHEST - 2 VIEW COMPARISON:  None Available. FINDINGS: The cardiac silhouette is mildly to moderately enlarged. The lungs are hypoinflated. There is moderate airway thickening. No focal airspace consolidation, overt pulmonary edema, sizable pleural effusion, or pneumothorax is identified. No acute osseous abnormality is seen. IMPRESSION: 1. Airway thickening which may  reflect bronchitis. 2. Cardiomegaly. Electronically Signed   By: Logan Bores M.D.   On: 04/02/2022 16:18    Microbiology: No results found for this or any previous visit (from the past 240 hour(s)).   Labs: Basic Metabolic Panel: Recent Labs  Lab 04/18/22 0937  NA 134*  K 3.9  CL 103  CO2 21*  GLUCOSE 94  BUN 17  CREATININE 2.18*  CALCIUM 9.0   Liver Function Tests: No results for input(s): "AST", "ALT", "ALKPHOS", "BILITOT", "PROT", "ALBUMIN" in the last 168 hours. No results for input(s): "LIPASE", "AMYLASE" in the last 168 hours. No results for input(s): "AMMONIA" in the last 168 hours. CBC: No results for input(s): "WBC", "NEUTROABS", "HGB", "HCT", "MCV", "PLT" in the last 168 hours. Cardiac Enzymes: No results for input(s): "CKTOTAL", "CKMB", "CKMBINDEX", "TROPONINI" in  the last 168 hours. BNP: BNP (last 3 results) Recent Labs    04/02/22 1630 04/18/22 0937  BNP 2,272.6* 70.6    ProBNP (last 3 results) No results for input(s): "PROBNP" in the last 8760 hours.  CBG: No results for input(s): "GLUCAP" in the last 168 hours.     Signed:  Domenic Polite MD.  [facility] 04/21/2022, 1:42 PM

## 2022-04-23 ENCOUNTER — Telehealth (HOSPITAL_COMMUNITY): Payer: Self-pay | Admitting: Licensed Clinical Social Worker

## 2022-04-23 NOTE — Telephone Encounter (Signed)
Pt called CSW to request assistance with FMLA claim.  Pt reports that [employer] is also wanting copy of clinicals from most recent note- pt provided verbal permission to sent- requested documents sent and fax confirmation received.  Jorge Ny, LCSW Clinical Social Worker [facility] Desk#: [phone] Cell#: [phone]

## 2022-04-29 ENCOUNTER — Telehealth (HOSPITAL_COMMUNITY): Payer: Self-pay | Admitting: Surgery

## 2022-04-29 NOTE — Telephone Encounter (Signed)
I called and left a message to inform patient that she can proceed with the sleep study as insurance prior Josem Kaufmann is not needed.

## 2022-05-01 ENCOUNTER — Encounter (INDEPENDENT_AMBULATORY_CARE_PROVIDER_SITE_OTHER): Payer: 59 | Admitting: Cardiology

## 2022-05-01 ENCOUNTER — Telehealth (HOSPITAL_COMMUNITY): Payer: Self-pay

## 2022-05-01 DIAGNOSIS — R0683 Snoring: Secondary | ICD-10-CM

## 2022-05-01 DIAGNOSIS — G4733 Obstructive sleep apnea (adult) (pediatric): Secondary | ICD-10-CM

## 2022-05-01 NOTE — Telephone Encounter (Signed)
Called and spoke with pt in regards to CR, pt stated she is not able to participate at this time, due to her work schedule.   Closed referral

## 2022-05-02 ENCOUNTER — Encounter (HOSPITAL_COMMUNITY): Payer: Self-pay

## 2022-05-05 ENCOUNTER — Other Ambulatory Visit (HOSPITAL_COMMUNITY): Payer: Self-pay

## 2022-05-05 MED ORDER — EMPAGLIFLOZIN 10 MG PO TABS: 10.0000 mg | ORAL_TABLET | Freq: Every day | ORAL | 0 refills | Status: DC

## 2022-05-05 MED ORDER — SPIRONOLACTONE 25 MG PO TABS: 25.0000 mg | ORAL_TABLET | Freq: Every day | ORAL | 0 refills | Status: DC

## 2022-05-05 MED ORDER — ISOSORBIDE MONONITRATE ER 60 MG PO TB24: 60.0000 mg | ORAL_TABLET | Freq: Two times a day (BID) | ORAL | 0 refills | Status: DC

## 2022-05-05 MED ORDER — ATORVASTATIN CALCIUM 40 MG PO TABS: 40.0000 mg | ORAL_TABLET | Freq: Every day | ORAL | 0 refills | Status: DC

## 2022-05-05 MED ORDER — AMLODIPINE BESYLATE 10 MG PO TABS: 10.0000 mg | ORAL_TABLET | Freq: Every day | ORAL | 0 refills | Status: DC

## 2022-05-07 ENCOUNTER — Telehealth (HOSPITAL_COMMUNITY): Payer: Self-pay

## 2022-05-07 NOTE — Telephone Encounter (Signed)
Informed patient that paperwork to [employer] has been faxed today.

## 2022-05-08 ENCOUNTER — Encounter (HOSPITAL_COMMUNITY): Payer: Self-pay | Admitting: Internal Medicine

## 2022-05-08 ENCOUNTER — Other Ambulatory Visit (HOSPITAL_COMMUNITY): Payer: Self-pay | Admitting: Internal Medicine

## 2022-05-08 DIAGNOSIS — N179 Acute kidney failure, unspecified: Secondary | ICD-10-CM

## 2022-05-08 DIAGNOSIS — R809 Proteinuria, unspecified: Secondary | ICD-10-CM

## 2022-05-08 NOTE — Progress Notes (Addendum)
ADVANCED HF CLINIC NOTE  Primary Care: [facility] Cardiologist: Dr. Aundra Dubin  HPI: 37 y.o.female w/ h/o HTN, obesity, chronic IDA and uterine fibroids. No prior cardiac history. 2 prior pregnancies. Last delivery was in 2021. Reports she had hypertension during pregnancy and treated w/ meds that continued up to 3 months postpartum, then stopped as she reports BP was better controlled.   In lab sleep study 2018 that was negative   She was flu +12/23, since then has struggled with persistent cough. Received treatment for suspected "bronchitis" w/o improvement. Then developed exertional dyspnea, orthopnea/PND, LEE and pleuritic CP. Symptoms progressively worsened, prompting her to come to ED.   Admitted 2/24 with a/c CHF and hypertensive urgency. Diuresed with IV lasix, nitro gtt. Echo showed EF 20-25%, global HK, no LVH, RV normal. + mod-large left pleural effusion. IVC not dilated. Renal US  No hydronephrosis. Increased parenchymal echogenicity which can be seen in the setting of medical renal disease. Bilateral pleural effusions. Drips weaned and GDMT titrated but limited by elevated SCr. cMRI showed Mild-Mod LVH Diffuse HK EF 35%, RV mildly reduced 45%. No myocardial LGE. She was discharged home, weight 235 lbs.  Post hospital follow up, NYHA II-III and volume stable. Coreg increased to 12.5 bid.  Today she returns for HF follow up. Overall feeling fine, continues with fatigue. Main complaint is palpitations. Breathing has improved, trying to walk more for exercise. She has SOB with inclines and steps.  Denies CP, dizziness, edema, or PND/Orthopnea. Appetite ok. No fever or chills. Weight at home 225 pounds. Taking all medications. 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 She has 2 children, (61 and 81 years old). She has completed her sleep study and mailed in last week. She has been referred to Rheumatology by her nephrologist as there is some concern she may have SLE.  ECG (personally  reviewed): none ordered today  Labs (2/24): K 3.6, creatinine 2.04, Hgb 8.7,  ANA + RF 70  Cardiac Studies  - cMRI (2/24): small R pleural effusion, small pericardial effusion, LVEF 35% with mild to moderate concentric LVH, RVEF 45%, no LGE.  - Echo (2/24): EF 20-25%, RV normal, moderate to large L pleural effusion.  Past Medical History:  Diagnosis Date   Anemia    Fibroid    Fibroids    Headache    Current Outpatient Medications  Medication Sig Dispense Refill   albuterol (VENTOLIN HFA) 108 (90 Base) MCG/ACT inhaler Inhale 1 puff into the lungs every 6 (six) hours as needed for wheezing or shortness of breath.     amLODipine (NORVASC) 10 MG tablet Take 1 tablet (10 mg total) by mouth daily. 90 tablet 0   atorvastatin (LIPITOR) 40 MG tablet Take 1 tablet (40 mg total) by mouth daily. 90 tablet 0   carvedilol (COREG) 12.5 MG tablet Take 1 tablet (12.5 mg total) by mouth 2 (two) times daily with a meal. 60 tablet 3   empagliflozin (JARDIANCE) 10 MG TABS tablet Take 1 tablet (10 mg total) by mouth daily. 90 tablet 0   fexofenadine (ALLEGRA) 180 MG tablet Take 180 mg by mouth daily as needed for allergies or rhinitis.     fluticasone (FLONASE) 50 MCG/ACT nasal spray Place 1 spray into both nostrils daily as needed for allergies or rhinitis.     hydrALAZINE (APRESOLINE) 25 MG tablet Take 3 tablets (75 mg total) by mouth 3 (three) times daily. 270 tablet 0   Iron-FA-B Cmp-C-Biot-Probiotic (FUSION PLUS) CAPS Take  1 capsule by mouth daily.     isosorbide mononitrate (IMDUR) 60 MG 24 hr tablet Take 1 tablet (60 mg total) by mouth 2 (two) times daily. 180 tablet 0   promethazine (PHENERGAN) 25 MG tablet Take 25 mg by mouth every 6 (six) hours as needed for nausea or vomiting.     spironolactone (ALDACTONE) 25 MG tablet Take 1 tablet (25 mg total) by mouth daily. 90 tablet 0   No current facility-administered medications for this encounter.   Facility-Administered Medications Ordered in Other  Encounters  Medication Dose Route Frequency Provider Last Rate Last Admin   gadopentetate dimeglumine (MAGNEVIST) injection 20 mL  20 mL Intravenous Once PRN Melvenia Beam, MD       Allergies  Allergen Reactions   Amoxicillin Other (See Comments)    Digestive issues   Social History   Socioeconomic History   Marital status: Married    Spouse name: Not on file   Number of children: 0   Years of education: 16   Highest education level: Not on file  Occupational History   Occupation: [employer]  Tobacco Use   Smoking status: Never   Smokeless tobacco: Never  Substance and Sexual Activity   Alcohol use: Yes    Alcohol/week: 0.0 standard drinks of alcohol    Comment: Socially   Drug use: No   Sexual activity: Yes    Birth control/protection: None  Other Topics Concern   Not on file  Social History Narrative   Lives at home with boyfriend   Caffeine use: Drinks tea (4 cups per week)   No soda/coffee   Social Determinants of Health   Financial Resource Strain: Low Risk  (01/21/2019)   Overall Financial Resource Strain (CARDIA)    Difficulty of Paying Living Expenses: Not hard at all  Food Insecurity: No Food Insecurity (04/03/2022)   Hunger Vital Sign    Worried About Running Out of Food in the Last Year: Never true    Ran Out of Food in the Last Year: Never true  Transportation Needs: No Transportation Needs (04/03/2022)   PRAPARE - Hydrologist (Medical): No    Lack of Transportation (Non-Medical): No  Physical Activity: Insufficiently Active (01/21/2019)   Exercise Vital Sign    Days of Exercise per Week: 3 days    Minutes of Exercise per Session: 30 min  Stress: No Stress Concern Present (01/21/2019)   [place]    Feeling of Stress : Not at all  Social Connections: Moderately Integrated (01/21/2019)   Social Connection and Isolation Panel [NHANES]    Frequency  of Communication with Friends and Family: More than three times a week    Frequency of Social Gatherings with Friends and Family: More than three times a week    Attends Religious Services: More than 4 times per year    Active Member of [employer] or Organizations: No    Attends Archivist Meetings: Never    Marital Status: Married  Human resources officer Violence: Not At Risk (04/03/2022)   Humiliation, Afraid, Rape, and Kick questionnaire    Fear of Current or Ex-Partner: No    Emotionally Abused: No    Physically Abused: No    Sexually Abused: No   Family History  Problem Relation Age of Onset   Migraines Father    Stroke Mother    Depression Mother    Lupus Maternal Aunt  Stroke Maternal Uncle    BP 130/78   Pulse 84   Wt 103.4 kg (228 lb)   SpO2 100%   BMI 37.94 kg/m   Wt Readings from Last 3 Encounters:  05/09/22 103.4 kg (228 lb)  04/18/22 102.1 kg (225 lb)  04/08/22 103 kg (227 lb 1.6 oz)   PHYSICAL EXAM: General:  NAD. No resp difficulty HEENT: Normal Neck: Supple. No JVD. Carotids 2+ bilat; no bruits. No lymphadenopathy or thryomegaly appreciated. Cor: PMI nondisplaced. Regular rate & rhythm. No rubs, gallops or murmurs. Lungs: Clear Abdomen: Soft, nontender, nondistended. No hepatosplenomegaly. No bruits or masses. Good bowel sounds. Extremities: No cyanosis, clubbing, rash, edema Neuro: Alert & oriented x 3, cranial nerves grossly intact. Moves all 4 extremities w/o difficulty. Affect pleasant.  ASSESSMENT & PLAN: 1. Chronic Systolic Heart Failure: New diagnosis. Admitted 2/24 with acute CHF and HTN urgency. Echo (2/24): EF 20-25%, RV normal. Suspect most likely HTN CM vs possible viral CM (had Flu 12/23). No ischemic CP. No family h/o HF. cMRI (2/24) showed mild-mod LVH, LVEF 35% with diffuse HK, RV mildly reduced 45%. No myocardial LGE. Today, she has improved NYHA II symptoms, she is not volume overloaded on exam. GDMT limited by CKD. - Increase Coreg to 25  mg bid. - Continue Jardiance 10 mg daily. No GU symptoms. - Continue spiro 25 mg daily. Recent labs stable (04/18/22) K 3.9, SCr 2.18 - Continue hydralazine 75 mg tid + Imdur 60 mg daily. Discussed Tylenol PRN for HA. - Holding off on ACE/ARB/ARNI for now with CKD with refractory hyperkalemia. - Needs to avoid pregnancy, has hormonal implant. - OK to start CR. - Repeat echo 2 months. 2. HTN: Reports h/o gestational HTN but stopped meds 3 months postpartum w/ reported improved BP control. BP 200/133 on admit, associated unexplained hypokalemia raises concern for secondary hypertension, hyperaldosteronism. Normetanephrines elevated, metanephrine normal, renal US (-). - BP better controlled today. - Increase Coreg as above. If able to add ARB/ARNi will consider stopping amlodipine. - Home sleep study has been completed and mailed in. Results pending. 3. CKD IV: last SCr on file was from 2020 (SCr 0.79). Recent admit with SCr up to 2.62. ANA + RF 70. Renal u/s with chronic disease. ? Hypertensive nephropathy.  - Plan for renal biopsy later this month. She follows with Dr. Johnney Ou. - Continue Jardiance. BMET today. 4. IDA: h/o heavy vaginal bleeding w/ h/o uterine fibroids. Ferritin 20, Tsat 12%-->rec'd Feraheme this admit. - Continue oral iron suppl. 5. Left Pleural Effusion: Moderate on echo and CXR. Lungs clear on exam today, 100% oxygen sat on room air.  6. Hyperlipidemia: LDL 101, goal < 55. - Continue atorvastatin 40 mg daily. Repeat LFTs/Lipids in 4 weeks. 7. Palpitations: No ectopy on recent ECGs. ? If sleep apnea contributing. Awaiting sleep study results. Recent TSH and CBC stable. - Place Zio 2 week to quantify. - Increase Coreg as above.   Follow up in 8 weeks with Dr. Aundra Dubin + echo.  Allena Katz, FNP-BC 05/09/22

## 2022-05-09 ENCOUNTER — Encounter (HOSPITAL_COMMUNITY): Payer: Self-pay

## 2022-05-09 ENCOUNTER — Ambulatory Visit (HOSPITAL_COMMUNITY)
Admission: RE | Admit: 2022-05-09 | Discharge: 2022-05-09 | Disposition: A | Discharge status: Home / Self Care | Payer: 59 | Source: Ambulatory Visit | Attending: Family Medicine | Admitting: Family Medicine

## 2022-05-09 ENCOUNTER — Inpatient Hospital Stay (HOSPITAL_COMMUNITY)
Admission: RE | Admit: 2022-05-09 | Discharge: 2022-05-09 | Disposition: A | Discharge status: Home / Self Care | Payer: 59 | Source: Ambulatory Visit | Attending: Cardiology | Admitting: Cardiology

## 2022-05-09 ENCOUNTER — Other Ambulatory Visit (HOSPITAL_COMMUNITY): Payer: Self-pay | Admitting: Cardiology

## 2022-05-09 VITALS — BP 130/78 | HR 84 | Wt 228.0 lb

## 2022-05-09 DIAGNOSIS — Z79899 Other long term (current) drug therapy: Secondary | ICD-10-CM | POA: Insufficient documentation

## 2022-05-09 DIAGNOSIS — R002 Palpitations: Secondary | ICD-10-CM

## 2022-05-09 DIAGNOSIS — I130 Hypertensive heart and chronic kidney disease with heart failure and stage 1 through stage 4 chronic kidney disease, or unspecified chronic kidney disease: Secondary | ICD-10-CM | POA: Diagnosis not present

## 2022-05-09 DIAGNOSIS — N184 Chronic kidney disease, stage 4 (severe): Secondary | ICD-10-CM | POA: Insufficient documentation

## 2022-05-09 DIAGNOSIS — Z7984 Long term (current) use of oral hypoglycemic drugs: Secondary | ICD-10-CM | POA: Diagnosis not present

## 2022-05-09 DIAGNOSIS — E669 Obesity, unspecified: Secondary | ICD-10-CM | POA: Diagnosis not present

## 2022-05-09 DIAGNOSIS — I160 Hypertensive urgency: Secondary | ICD-10-CM | POA: Insufficient documentation

## 2022-05-09 DIAGNOSIS — Z86018 Personal history of other benign neoplasm: Secondary | ICD-10-CM | POA: Diagnosis not present

## 2022-05-09 DIAGNOSIS — J90 Pleural effusion, not elsewhere classified: Secondary | ICD-10-CM | POA: Diagnosis not present

## 2022-05-09 DIAGNOSIS — E785 Hyperlipidemia, unspecified: Secondary | ICD-10-CM | POA: Insufficient documentation

## 2022-05-09 DIAGNOSIS — D509 Iron deficiency anemia, unspecified: Secondary | ICD-10-CM

## 2022-05-09 DIAGNOSIS — Z6837 Body mass index (BMI) 37.0-37.9, adult: Secondary | ICD-10-CM | POA: Insufficient documentation

## 2022-05-09 DIAGNOSIS — I5022 Chronic systolic (congestive) heart failure: Secondary | ICD-10-CM | POA: Insufficient documentation

## 2022-05-09 DIAGNOSIS — I10 Essential (primary) hypertension: Secondary | ICD-10-CM

## 2022-05-09 MED ORDER — CARVEDILOL 25 MG PO TABS: 25.0000 mg | ORAL_TABLET | Freq: Two times a day (BID) | ORAL | 3 refills | Status: DC

## 2022-05-09 NOTE — Addendum Note (Signed)
Encounter addended by: Rockwell Alexandria, CMA on: 05/09/2022 4:34 PM  Actions taken: Imaging Exam begun

## 2022-05-09 NOTE — Addendum Note (Signed)
Encounter addended by: Rockwell Alexandria, CMA on: 05/09/2022 4:33 PM  Actions taken: Imaging Exam begun

## 2022-05-09 NOTE — Addendum Note (Signed)
Encounter addended by: Rafael Bihari, FNP on: 05/09/2022 4:43 PM  Actions taken: Clinical Note Signed

## 2022-05-09 NOTE — Addendum Note (Signed)
Encounter addended by: Rockwell Alexandria, CMA on: 05/09/2022 4:32 PM  Actions taken: Imaging Exam begun

## 2022-05-09 NOTE — Addendum Note (Signed)
Encounter addended by: Rockwell Alexandria, CMA on: 05/09/2022 4:22 PM  Actions taken: Clinical Note Signed

## 2022-05-09 NOTE — Progress Notes (Signed)
Your provider has recommended that  you wear a Zio Patch for 14 days.  This monitor will record your heart rhythm for our review.  IF you have any symptoms while wearing the monitor please press the button.  If you have any issues with the patch or you notice a red or orange light on it please call the company at [phone].  Once you remove the patch please mail it back to the company as soon as possible so we can get the results.  

## 2022-05-09 NOTE — Patient Instructions (Addendum)
Thank you for coming in today  Labs were done today, if any labs are abnormal the clinic will call you No news is good news  Medications: INCREASE Coreg to 25 mg 1 tablet twice daily  Your provider has recommended that  you wear a Zio Patch for 14 days.  This monitor will record your heart rhythm for our review.  IF you have any symptoms while wearing the monitor please press the button.  If you have any issues with the patch or you notice a red or orange light on it please call the company at [phone].  Once you remove the patch please mail it back to the company as soon as possible so we can get the results.   Follow up appointments: Labs in 4 weeks  8 weeks with Dr. Aundra Dubin with echocardiogram  Your physician has requested that you have an echocardiogram. Echocardiography is a painless test that uses sound waves to create images of your heart. It provides your doctor with information about the size and shape of your heart and how well your heart's chambers and valves are working. This procedure takes approximately one hour. There are no restrictions for this procedure.     Your physician recommends that you schedule a follow-up appointment in: 8 weeks   Do the following things EVERYDAY: Weigh yourself in the morning before breakfast. Write it down and keep it in a log. Take your medicines as prescribed Eat low salt foods--Limit salt (sodium) to 2000 mg per day.  Stay as active as you can everyday Limit all fluids for the day to less than 2 liters   At the [facility], you and your health needs are our priority. As part of our continuing mission to provide you with exceptional heart care, we have created designated Provider Care Teams. These Care Teams include your primary Cardiologist (physician) and Advanced Practice Providers (APPs- Physician Assistants and Nurse Practitioners) who all work together to provide you with the care you need, when you need it.    You may see any of the following providers on your designated Care Team at your next follow up: Dr Glori Bickers Dr Loralie Champagne Dr. Roxana Hires, NP Lyda Jester, [place] [facility] [place], [place] Forestine Na, NP Audry Riles, PharmD   Please be sure to bring in all your medications bottles to every appointment.    Thank you for choosing [facility]  If you have any questions or concerns before your next appointment please send [place] a message through [place] or call our office at [phone].    TO LEAVE A MESSAGE FOR THE NURSE SELECT OPTION 2, PLEASE LEAVE A MESSAGE INCLUDING: YOUR NAME DATE OF BIRTH CALL BACK NUMBER REASON FOR CALL**this is important as we prioritize the call backs  YOU WILL RECEIVE A CALL BACK THE SAME DAY AS LONG AS YOU CALL BEFORE 4:00 PM

## 2022-05-12 ENCOUNTER — Encounter (HOSPITAL_COMMUNITY): Payer: Self-pay

## 2022-05-16 ENCOUNTER — Other Ambulatory Visit (HOSPITAL_COMMUNITY): Payer: Self-pay

## 2022-05-16 MED ORDER — CARVEDILOL 12.5 MG PO TABS: ORAL_TABLET | ORAL | 3 refills | Status: DC

## 2022-05-19 ENCOUNTER — Ambulatory Visit: Payer: 59 | Attending: Cardiology

## 2022-05-19 DIAGNOSIS — R400 Somnolence: Secondary | ICD-10-CM

## 2022-05-19 NOTE — Procedures (Signed)
   Patient Information Study Date: 05/01/2022 Patient Name: Maria Osborn Patient ID: [id number] Birth Date: [birth year] Age: 37 Gender: Female BMI: 37.5 (W=225 lb, H=5' 5'') Stopbang: 5 Referring Physician: Allena Katz, NP  TEST DESCRIPTION: Home sleep apnea testing was completed using the WatchPat, a Type 1 device, utilizing peripheral arterial tonometry (PAT), chest movement, actigraphy, pulse oximetry, pulse rate, body position and snore. AHI was calculated with apnea and hypopnea using valid sleep time as the denominator. RDI includes apneas, hypopneas, and RERAs. The data acquired and the scoring of sleep and all associated events were performed in accordance with the recommended standards and specifications as outlined in the AASM Manual for the Scoring of Sleep and Associated Events 2.2.0 (2015).   FINDINGS: 1.  No evidence of Obstructive Sleep Apnea with AHI 1.9/hr.  2.  No Central Sleep Apnea. 3.  Oxygen desaturations as low as 90%. 4.  Mild to moderate snoring was present. O2 sats were < 88% for 0 minutes. 5.  Total sleep time was 5 hrs and 15 min. 6.  28.8% of total sleep time was spent in REM sleep.  7.  Normal sleep onset latency at 10 min.  8.  Shortened REM sleep onset latency at 58 min.  9.  Total awakenings were 5.   DIAGNOSIS:  Normal study with no significant sleep disordered breathing.  RECOMMENDATIONS:   1. Normal study with no significant sleep disordered breathing.  2.  Healthy sleep recommendations include:  adequate nightly sleep (normal 7-9 hrs/night), avoidance of caffeine after noon and alcohol near bedtime, and maintaining a sleep environment that is cool, dark and quiet.  3.  Weight loss for overweight patients is recommended.    4.  Snoring recommendations include:  weight loss where appropriate, side sleeping, and avoidance of alcohol before bed.  5.  Operation of motor vehicle or dangerous equipment must be avoided when feeling  drowsy, excessively sleepy, or mentally fatigued.    6.  An ENT consultation which may be useful for specific causes of and possible treatment of bothersome snoring.   7. Weight loss may be of benefit in reducing the severity of snoring.   Signature: Fransico Him, MD; [facility]; Diplomat, American Board of Sleep Medicine Electronically Signed: 05/19/2022

## 2022-05-20 ENCOUNTER — Encounter (HOSPITAL_COMMUNITY): Payer: Self-pay

## 2022-05-20 NOTE — Telephone Encounter (Signed)
Will forward as fyi

## 2022-05-21 ENCOUNTER — Telehealth (HOSPITAL_COMMUNITY): Payer: Self-pay

## 2022-05-21 NOTE — Telephone Encounter (Signed)
Forms waiting MD signature

## 2022-05-23 ENCOUNTER — Other Ambulatory Visit (HOSPITAL_COMMUNITY): Payer: Self-pay | Admitting: Student

## 2022-05-23 DIAGNOSIS — N179 Acute kidney failure, unspecified: Secondary | ICD-10-CM

## 2022-05-23 NOTE — Progress Notes (Signed)
Chief Complaint: Patient was seen in consultation today for a random renal biopsy.  Referring Physician(s): Justin Mend  Supervising Physician: Aletta Edouard  Patient Status: [facility] - Out-pt  History of Present Illness: Maria Osborn is a 37 y.o. female with a past medical history significant for anemia, HTN, uterine fibroids, CKD and proteinuria who presents today for a random renal biopsy. Ms. Cirino was admitted to [facility] on 04/02/22 with dyspnea and edema, she was found to have SBP in the 200s with BNP 2200 and creatinine 2.6.She was followed by nephrology during this admission and was found to have elevated C3, normal C4, positive ANA, negative dsDNA, positive anti RNP, negative ANCA, negative antiGBM. Renal US on 04/03/22 showed:  1. No hydronephrosis. 2. Increased parenchymal echogenicity which can be seen in the setting of medical renal disease. 3. Bilateral pleural effusions.  She was diagnosed with CHF as well as CKD vs AKI and was discharged with outpatient nephrology follow up. She was seen by Dr. Hollie Salk on 05/05/22 and UA noted proteinuria. IR has been consulted for a random renal biopsy to further guide treatment plan.   Past Medical History:  Diagnosis Date   Anemia    Fibroid    Fibroids    Headache     Past Surgical History:  Procedure Laterality Date   CESAREAN SECTION N/A 12/26/2017   Procedure: CESAREAN SECTION;  Surgeon: Linda Hedges, DO;  Location: [place];  Service: Obstetrics;  Laterality: N/A;   CESAREAN SECTION N/A 01/21/2019   Procedure: CESAREAN SECTION;  Surgeon: Azucena Fallen, MD;  Location: [facility];  Service: Obstetrics;  Laterality: N/A;   DILATION AND CURETTAGE OF UTERUS     HYSTEROSCOPY WITH D & C N/A 06/10/2016   Procedure: DILATATION & CURETTAGE/HYSTEROSCOPY;  Surgeon: Linda Hedges, DO;  Location: [facility];  Service: Gynecology;  Laterality: N/A;   MYOMECTOMY N/A 06/10/2016   Procedure: MYOMECTOMY, abdominal;   Surgeon: Linda Hedges, DO;  Location: [facility];  Service: Gynecology;  Laterality: N/A;    Allergies: Amoxicillin  Medications: Prior to Admission medications   Medication Sig Start Date End Date Taking? Authorizing Provider  albuterol (VENTOLIN HFA) 108 (90 Base) MCG/ACT inhaler Inhale 1 puff into the lungs every 6 (six) hours as needed for wheezing or shortness of breath.    [provider]  amLODipine (NORVASC) 10 MG tablet Take 1 tablet (10 mg total) by mouth daily. 05/05/22   [place], Maricela Bo, FNP  atorvastatin (LIPITOR) 40 MG tablet Take 1 tablet (40 mg total) by mouth daily. 05/05/22   Rafael Bihari, FNP  carvedilol (COREG) 12.5 MG tablet Take 18.75mg  twice daily 05/16/22   Rafael Bihari, FNP  empagliflozin (JARDIANCE) 10 MG TABS tablet Take 1 tablet (10 mg total) by mouth daily. 05/05/22   [place], Maricela Bo, FNP  fexofenadine (ALLEGRA) 180 MG tablet Take 180 mg by mouth daily as needed for allergies or rhinitis.    [provider]  fluticasone (FLONASE) 50 MCG/ACT nasal spray Place 1 spray into both nostrils daily as needed for allergies or rhinitis.    [provider]  hydrALAZINE (APRESOLINE) 25 MG tablet Take 3 tablets (75 mg total) by mouth 3 (three) times daily. 04/08/22 05/09/23  Domenic Polite, MD  Iron-FA-B Cmp-C-Biot-Probiotic (FUSION PLUS) CAPS Take 1 capsule by mouth daily. 05/08/22   [provider]  isosorbide mononitrate (IMDUR) 60 MG 24 hr tablet Take 1 tablet (60 mg total) by mouth 2 (two) times daily. 05/05/22  [place], Maricela Bo, FNP  promethazine (PHENERGAN) 25 MG tablet Take 25 mg by mouth every 6 (six) hours as needed for nausea or vomiting.    [provider]  spironolactone (ALDACTONE) 25 MG tablet Take 1 tablet (25 mg total) by mouth daily. 05/05/22   [place], Maricela Bo, FNP     Family History  Problem Relation Age of Onset   Migraines Father    Stroke Mother    Depression Mother    Lupus Maternal Aunt    Stroke  Maternal Uncle     Social History   Socioeconomic History   Marital status: Married    Spouse name: Not on file   Number of children: 0   Years of education: 16   Highest education level: Not on file  Occupational History   Occupation: [employer]  Tobacco Use   Smoking status: Never   Smokeless tobacco: Never  Substance and Sexual Activity   Alcohol use: Yes    Alcohol/week: 0.0 standard drinks of alcohol    Comment: Socially   Drug use: No   Sexual activity: Yes    Birth control/protection: None  Other Topics Concern   Not on file  Social History Narrative   Lives at home with boyfriend   Caffeine use: Drinks tea (4 cups per week)   No soda/coffee   Social Determinants of Health   Financial Resource Strain: Low Risk  (01/21/2019)   Overall Financial Resource Strain (CARDIA)    Difficulty of Paying Living Expenses: Not hard at all  Food Insecurity: No Food Insecurity (04/03/2022)   Hunger Vital Sign    Worried About Running Out of Food in the Last Year: Never true    Ran Out of Food in the Last Year: Never true  Transportation Needs: No Transportation Needs (04/03/2022)   PRAPARE - Hydrologist (Medical): No    Lack of Transportation (Non-Medical): No  Physical Activity: Insufficiently Active (01/21/2019)   Exercise Vital Sign    Days of Exercise per Week: 3 days    Minutes of Exercise per Session: 30 min  Stress: No Stress Concern Present (01/21/2019)   [place]    Feeling of Stress : Not at all  Social Connections: Moderately Integrated (01/21/2019)   Social Connection and Isolation Panel [NHANES]    Frequency of Communication with Friends and Family: More than three times a week    Frequency of Social Gatherings with Friends and Family: More than three times a week    Attends Religious Services: More than 4 times per year    Active Member of [employer] or  Organizations: No    Attends Archivist Meetings: Never    Marital Status: Married     Review of Systems: A 12 point ROS discussed and pertinent positives are indicated in the HPI above.  All other systems are negative.  Review of Systems  Constitutional:  Negative for chills and fever.  HENT:  Positive for rhinorrhea (allergies).   Respiratory:  Negative for cough and shortness of breath.   Cardiovascular:  Negative for chest pain.  Gastrointestinal:  Negative for abdominal pain, diarrhea, nausea and vomiting.  Genitourinary:  Negative for hematuria.  Musculoskeletal:  Positive for back pain (2/2 menstruation).  Skin:  Negative for rash.  Neurological:  Negative for dizziness and headaches.    Vital Signs: BP 118/70   Pulse 81   Temp 98.1 F (36.7 C) (Oral)  Resp 17   Ht 5\' 5"  (1.651 m)   Wt 236 lb (107 kg)   LMP 05/21/2022   SpO2 100%   BMI 39.27 kg/m   Physical Exam Vitals reviewed.  Constitutional:      General: She is not in acute distress.    Appearance: She is not ill-appearing.  HENT:     Head: Normocephalic and atraumatic.     Mouth/Throat:     Mouth: Mucous membranes are moist.     Pharynx: Oropharynx is clear. No oropharyngeal exudate or posterior oropharyngeal erythema.  Cardiovascular:     Rate and Rhythm: Normal rate and regular rhythm.  Pulmonary:     Effort: Pulmonary effort is normal.     Breath sounds: Normal breath sounds.  Abdominal:     General: There is no distension.     Palpations: Abdomen is soft.     Tenderness: There is no abdominal tenderness.  Skin:    General: Skin is warm and dry.  Neurological:     Mental Status: She is alert and oriented to person, place, and time.  Psychiatric:        Mood and Affect: Mood normal.        Behavior: Behavior normal.        Thought Content: Thought content normal.        Judgment: Judgment normal.      MD Evaluation Airway: WNL Heart: WNL Abdomen: WNL Chest/ Lungs: WNL ASA   Classification: 3 Mallampati/Airway Score: One   Imaging: No results found.  Labs:  CBC: Recent Labs    04/05/22 0026 04/06/22 0043 04/07/22 0439 04/08/22 0653  WBC 9.5 8.2 7.4 6.5  HGB 9.1* 8.5* 9.1* 8.7*  HCT 28.4* 26.8* 29.4* 27.4*  PLT 233 223 251 217    COAGS: No results for input(s): "INR", "APTT" in the last 8760 hours.  BMP: Recent Labs    04/06/22 0043 04/07/22 0439 04/08/22 0653 04/18/22 0937  NA 134* 134* 135 134*  K 2.8* 3.5 3.6 3.9  CL 101 105 107 103  CO2 25 21* 20* 21*  GLUCOSE 99 96 90 94  BUN 21* 15 13 17   CALCIUM 8.2* 8.6* 8.5* 9.0  CREATININE 2.60* 2.28* 2.04* 2.18*  GFRNONAA 24* 28* 32* 30*    LIVER FUNCTION TESTS: Recent Labs    04/02/22 2040  BILITOT 1.2  AST 19  ALT 17  ALKPHOS 65  PROT 6.6  ALBUMIN 2.9*    TUMOR MARKERS: No results for input(s): "AFPTM", "CEA", "CA199", "CHROMGRNA" in the last 8760 hours.  Assessment and Plan:  37 y/o F with recently diagnosed CKD with proteinuria and positive ANA who presents today for a random renal biopsy to further direct care.  Risks and benefits of random renal biopsy was discussed with the patient and/or patient's family including, but not limited to bleeding, infection, damage to adjacent structures or low yield requiring additional tests.  All of the questions were answered and there is agreement to proceed.  Consent signed and in chart.   Thank you for this interesting consult.  I greatly enjoyed meeting Maria Osborn and look forward to participating in their care.  A copy of this report was sent to the requesting provider on this date.  Electronically Signed: Joaquim Nam, PA-C 05/23/2022, 3:43 PM   I spent a total of 30 Minutes  in face to face in clinical consultation, greater than 50% of which was counseling/coordinating care for proteinuria.

## 2022-05-26 ENCOUNTER — Ambulatory Visit (HOSPITAL_COMMUNITY)
Admission: RE | Admit: 2022-05-26 | Discharge: 2022-05-26 | Disposition: A | Discharge status: Home / Self Care | Payer: 59 | Source: Ambulatory Visit | Attending: Internal Medicine | Admitting: Internal Medicine

## 2022-05-26 ENCOUNTER — Encounter (HOSPITAL_COMMUNITY): Payer: Self-pay

## 2022-05-26 DIAGNOSIS — R809 Proteinuria, unspecified: Secondary | ICD-10-CM | POA: Diagnosis not present

## 2022-05-26 DIAGNOSIS — Z86018 Personal history of other benign neoplasm: Secondary | ICD-10-CM | POA: Diagnosis not present

## 2022-05-26 DIAGNOSIS — N189 Chronic kidney disease, unspecified: Secondary | ICD-10-CM | POA: Diagnosis not present

## 2022-05-26 DIAGNOSIS — I129 Hypertensive chronic kidney disease with stage 1 through stage 4 chronic kidney disease, or unspecified chronic kidney disease: Secondary | ICD-10-CM | POA: Insufficient documentation

## 2022-05-26 DIAGNOSIS — J90 Pleural effusion, not elsewhere classified: Secondary | ICD-10-CM | POA: Diagnosis not present

## 2022-05-26 DIAGNOSIS — N179 Acute kidney failure, unspecified: Secondary | ICD-10-CM

## 2022-05-26 LAB — CBC
HCT: 32.3 % — ABNORMAL LOW (ref 36.0–46.0)
Hemoglobin: 10.4 g/dL — ABNORMAL LOW (ref 12.0–15.0)
MCH: 27 pg (ref 26.0–34.0)
MCHC: 32.2 g/dL (ref 30.0–36.0)
MCV: 83.9 fL (ref 80.0–100.0)
Platelets: 158 10*3/uL (ref 150–400)
RBC: 3.85 MIL/uL — ABNORMAL LOW (ref 3.87–5.11)
RDW: 19.3 % — ABNORMAL HIGH (ref 11.5–15.5)
WBC: 8.8 10*3/uL (ref 4.0–10.5)
nRBC: 0 % (ref 0.0–0.2)

## 2022-05-26 LAB — PROTIME-INR
INR: 1.1 (ref 0.8–1.2)
Prothrombin Time: 13.6 seconds (ref 11.4–15.2)

## 2022-05-26 MED ORDER — MIDAZOLAM HCL 2 MG/2ML IJ SOLN
INTRAMUSCULAR | Status: AC | PRN
  Administered 2022-05-26: 1 mg via INTRAVENOUS
  Administered 2022-05-26 (×2): .5 mg via INTRAVENOUS

## 2022-05-26 MED ORDER — MIDAZOLAM HCL 2 MG/2ML IJ SOLN
INTRAMUSCULAR | Status: AC
  Filled 2022-05-26: qty 2

## 2022-05-26 MED ORDER — LIDOCAINE HCL (PF) 1 % IJ SOLN
8.0000 mL | Freq: Once | INTRAMUSCULAR | Status: AC
  Administered 2022-05-26: 8 mL via INTRADERMAL

## 2022-05-26 MED ORDER — GELATIN ABSORBABLE 12-7 MM EX MISC
1.0000 | Freq: Once | CUTANEOUS | Status: AC
  Administered 2022-05-26: 1 via TOPICAL

## 2022-05-26 MED ORDER — FENTANYL CITRATE (PF) 100 MCG/2ML IJ SOLN
INTRAMUSCULAR | Status: AC
  Filled 2022-05-26: qty 2

## 2022-05-26 MED ORDER — SODIUM CHLORIDE 0.9 % IV SOLN: INTRAVENOUS | Status: DC

## 2022-05-26 MED ORDER — FENTANYL CITRATE (PF) 100 MCG/2ML IJ SOLN
INTRAMUSCULAR | Status: AC | PRN
  Administered 2022-05-26: 50 ug via INTRAVENOUS
  Administered 2022-05-26 (×2): 25 ug via INTRAVENOUS

## 2022-05-26 MED ORDER — LIDOCAINE HCL (PF) 1 % IJ SOLN
INTRAMUSCULAR | Status: AC
  Filled 2022-05-26: qty 30

## 2022-05-26 NOTE — Telephone Encounter (Signed)
Forms faxed on 05/22/22, my chart message sent to patient

## 2022-05-26 NOTE — Procedures (Signed)
Interventional Radiology Procedure Note  Procedure: US Guided Biopsy of left kidney  Complications: None  Estimated Blood Loss: < 10 mL  Findings: 18 G core biopsy of lower pole of left kidney performed under US guidance.  Two core samples obtained and sent to Pathology.  Venetia Night. Kathlene Cote, M.D Pager:  [phone]

## 2022-05-28 ENCOUNTER — Telehealth: Payer: Self-pay | Admitting: *Deleted

## 2022-05-28 ENCOUNTER — Encounter (HOSPITAL_COMMUNITY): Payer: Self-pay

## 2022-05-28 LAB — SURGICAL PATHOLOGY

## 2022-05-28 NOTE — Telephone Encounter (Signed)
-----   Message from Maria Osborn, [place] sent at 05/19/2022  8:32 AM EDT -----  ----- Message ----- From: Sueanne Margarita, MD Sent: 05/19/2022   7:54 AM EDT To: Cv Div Sleep Studies  Please let patient know that sleep study showed no significant sleep apnea.

## 2022-05-29 NOTE — Telephone Encounter (Signed)
The patient has been notified of the result. Left detailed message on voicemail and informed patient to call back..[name], CMA   

## 2022-06-02 NOTE — Telephone Encounter (Signed)
The patient has been notified of the result. Left detailed message on voicemail and informed patient to call back..Wilene Pharo Green, CMA   

## 2022-06-03 NOTE — Addendum Note (Signed)
Encounter addended by: Micki Riley, RN on: 06/03/2022 1:46 PM  Actions taken: Imaging Exam ended

## 2022-06-04 NOTE — Telephone Encounter (Signed)
The patient has been notified of the result and verbalized understanding.  All questions (if any) were answered. Maria Osborn, [place] 06/04/2022 4:08 PM    Pt is aware and agreeable to normal results.

## 2022-06-06 ENCOUNTER — Ambulatory Visit (HOSPITAL_COMMUNITY)
Admission: RE | Admit: 2022-06-06 | Discharge: 2022-06-06 | Disposition: A | Discharge status: Home / Self Care | Payer: 59 | Source: Ambulatory Visit | Attending: Cardiology | Admitting: Cardiology

## 2022-06-06 DIAGNOSIS — I5022 Chronic systolic (congestive) heart failure: Secondary | ICD-10-CM | POA: Diagnosis present

## 2022-06-06 LAB — HEPATIC FUNCTION PANEL
ALT: 26 U/L (ref 0–44)
AST: 19 U/L (ref 15–41)
Albumin: 3.5 g/dL (ref 3.5–5.0)
Alkaline Phosphatase: 74 U/L (ref 38–126)
Bilirubin, Direct: <0.1 mg/dL (ref 0.0–0.2)
Total Bilirubin: 0.5 mg/dL (ref 0.3–1.2)
Total Protein: 7.8 g/dL (ref 6.5–8.1)

## 2022-06-06 LAB — LIPID PANEL
Cholesterol: 87 mg/dL (ref 0–200)
HDL: 38 mg/dL — ABNORMAL LOW (ref >40)
LDL Cholesterol: 42 mg/dL (ref 0–99)
Total CHOL/HDL Ratio: 2.3 RATIO
Triglycerides: 36 mg/dL (ref <150)
VLDL: 7 mg/dL (ref 0–40)

## 2022-07-03 ENCOUNTER — Telehealth: Payer: Self-pay | Admitting: *Deleted

## 2022-07-04 ENCOUNTER — Encounter (HOSPITAL_COMMUNITY): Payer: Self-pay

## 2022-07-25 ENCOUNTER — Ambulatory Visit (HOSPITAL_BASED_OUTPATIENT_CLINIC_OR_DEPARTMENT_OTHER)
Admission: RE | Admit: 2022-07-25 | Discharge: 2022-07-25 | Disposition: A | Discharge status: Home / Self Care | Payer: 59 | Source: Ambulatory Visit | Attending: Cardiology | Admitting: Cardiology

## 2022-07-25 ENCOUNTER — Ambulatory Visit (HOSPITAL_COMMUNITY)
Admission: RE | Admit: 2022-07-25 | Discharge: 2022-07-25 | Disposition: A | Discharge status: Home / Self Care | Payer: 59 | Source: Ambulatory Visit | Attending: Cardiology | Admitting: Cardiology

## 2022-07-25 ENCOUNTER — Encounter (HOSPITAL_COMMUNITY): Payer: Self-pay | Admitting: Cardiology

## 2022-07-25 ENCOUNTER — Other Ambulatory Visit (HOSPITAL_COMMUNITY): Payer: Self-pay

## 2022-07-25 ENCOUNTER — Telehealth (HOSPITAL_COMMUNITY): Payer: Self-pay

## 2022-07-25 VITALS — BP 104/60 | HR 74 | Wt 242.6 lb

## 2022-07-25 DIAGNOSIS — I5022 Chronic systolic (congestive) heart failure: Secondary | ICD-10-CM

## 2022-07-25 DIAGNOSIS — Z7984 Long term (current) use of oral hypoglycemic drugs: Secondary | ICD-10-CM | POA: Diagnosis not present

## 2022-07-25 DIAGNOSIS — E669 Obesity, unspecified: Secondary | ICD-10-CM | POA: Insufficient documentation

## 2022-07-25 DIAGNOSIS — D509 Iron deficiency anemia, unspecified: Secondary | ICD-10-CM | POA: Insufficient documentation

## 2022-07-25 DIAGNOSIS — N184 Chronic kidney disease, stage 4 (severe): Secondary | ICD-10-CM | POA: Diagnosis not present

## 2022-07-25 DIAGNOSIS — Z86018 Personal history of other benign neoplasm: Secondary | ICD-10-CM | POA: Diagnosis not present

## 2022-07-25 DIAGNOSIS — Z823 Family history of stroke: Secondary | ICD-10-CM | POA: Insufficient documentation

## 2022-07-25 DIAGNOSIS — I130 Hypertensive heart and chronic kidney disease with heart failure and stage 1 through stage 4 chronic kidney disease, or unspecified chronic kidney disease: Secondary | ICD-10-CM | POA: Insufficient documentation

## 2022-07-25 DIAGNOSIS — E785 Hyperlipidemia, unspecified: Secondary | ICD-10-CM | POA: Diagnosis not present

## 2022-07-25 DIAGNOSIS — Z79899 Other long term (current) drug therapy: Secondary | ICD-10-CM | POA: Insufficient documentation

## 2022-07-25 DIAGNOSIS — E876 Hypokalemia: Secondary | ICD-10-CM | POA: Insufficient documentation

## 2022-07-25 LAB — ECHOCARDIOGRAM COMPLETE
AR max vel: 2.01 cm2
AV Area VTI: 2.02 cm2
AV Area mean vel: 1.93 cm2
AV Mean grad: 5 mmHg
AV Peak grad: 10 mmHg
Ao pk vel: 1.58 m/s
Area-P 1/2: 3.16 cm2
Calc EF: 48.7 %
Est EF: 40
S' Lateral: 4.4 cm
Single Plane A2C EF: 46.8 %
Single Plane A4C EF: 49.9 %

## 2022-07-25 LAB — BASIC METABOLIC PANEL WITH GFR
Anion gap: 5 (ref 5–15)
BUN: 24 mg/dL — ABNORMAL HIGH (ref 6–20)
CO2: 24 mmol/L (ref 22–32)
Calcium: 9.1 mg/dL (ref 8.9–10.3)
Chloride: 105 mmol/L (ref 98–111)
Creatinine, Ser: 1.98 mg/dL — ABNORMAL HIGH (ref 0.44–1.00)
GFR, Estimated: 33 mL/min — ABNORMAL LOW
Glucose, Bld: 90 mg/dL (ref 70–99)
Potassium: 4.7 mmol/L (ref 3.5–5.1)
Sodium: 134 mmol/L — ABNORMAL LOW (ref 135–145)

## 2022-07-25 LAB — BRAIN NATRIURETIC PEPTIDE: B Natriuretic Peptide: 11 pg/mL (ref 0.0–100.0)

## 2022-07-25 MED ORDER — ENTRESTO 24-26 MG PO TABS: 1.0000 | ORAL_TABLET | Freq: Two times a day (BID) | ORAL | 11 refills | Status: DC

## 2022-07-25 NOTE — Patient Instructions (Signed)
STOP Losartan    STOP imdur.  START Entresto 24/26 mg Twice daily  Labs done today, your results will be available in MyChart, we will contact you for abnormal readings.  Repeat blood work in 1 week.  Please follow up with our heart failure pharmacist in 3 weeks  Your physician recommends that you schedule a follow-up appointment in: 2 months  If you have any questions or concerns before your next appointment please send [place] a message through [place] or call our office at [phone].    TO LEAVE A MESSAGE FOR THE NURSE SELECT OPTION 2, PLEASE LEAVE A MESSAGE INCLUDING: YOUR NAME DATE OF BIRTH CALL BACK NUMBER REASON FOR CALL**this is important as we prioritize the call backs  YOU WILL RECEIVE A CALL BACK THE SAME DAY AS LONG AS YOU CALL BEFORE 4:00 PM  At the [facility], you and your health needs are our priority. As part of our continuing mission to provide you with exceptional heart care, we have created designated Provider Care Teams. These Care Teams include your primary Cardiologist (physician) and Advanced Practice Providers (APPs- Physician Assistants and Nurse Practitioners) who all work together to provide you with the care you need, when you need it.   You may see any of the following providers on your designated Care Team at your next follow up: Dr Arvilla Meres Dr Marca Ancona Dr. Marcos Eke, NP Robbie Lis, [place] [facility] [place], [place] Brynda Peon, NP Karle Plumber, PharmD   Please be sure to bring in all your medications bottles to every appointment.    Thank you for choosing [facility]

## 2022-07-25 NOTE — Telephone Encounter (Signed)
-----   Message from Laurey Morale, MD sent at 07/25/2022  2:19 PM EDT ----- Stable creatinine, low BNP

## 2022-07-25 NOTE — Telephone Encounter (Signed)
Patient aware of lab results.

## 2022-07-26 NOTE — Progress Notes (Signed)
ADVANCED HF CLINIC NOTE  Primary Care: [facility] Cardiologist: Dr. Shirlee Latch  HPI: 37 y.o.female w/ h/o HTN, obesity, chronic IDA and uterine fibroids. No prior cardiac history. 2 prior pregnancies. Last delivery was in 2021. Reports she had hypertension during pregnancy and treated w/ meds that continued up to 3 months postpartum, then stopped as she reports BP was better controlled.   In lab sleep study 2018 was negative   Admitted 2/24 with acute CHF and hypertensive urgency. Diuresed with IV lasix, nitroglycerin gtt. Echo showed EF 20-25%, global HK, no LVH, RV normal. + mod-large left pleural effusion.  Drips weaned and GDMT titrated but limited by elevated SCr. cMRI showed Mild-Mod LVH with diffuse HK and EF 35%, RV mildly reduced systolic function with EF 45%, no myocardial LGE. She was discharged home, weight 235 lbs.  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   Echo was done today and reviewed, EF 40%, mild LVH, normal RV, normal IVC.   She returns today for followup of CHF. Weight is up, she reports eating more.  She walks regularly without dyspnea on flat ground.  Mild dyspnea with stairs.  No orthopnea/PND.  No chest pain.   ECG (personally reviewed): none ordered today  Labs (2/24): K 3.6, creatinine 2.04, Hgb 8.7,  ANA +, RF 70 Labs (4/24): LDL 42, creatinine 1.76  PMH: 1. Uterine fibroids. 2. Iron deficiency anemia 3. HTN: First noted while pregnant.  4. 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  5. Chronic systolic CHF:  - cMRI (2/24): small R pleural effusion, small pericardial effusion, LVEF 35% with mild to moderate concentric LVH, RVEF 45%, no LGE. - Echo (2/24): EF 20-25%, RV normal, moderate to large L pleural effusion. - Echo (5/24): EF 40%, mild LVH, normal RV, normal IVC.  6. Zio monitor (4/24): 1 NSVT run, 6 beats.  Rare PVCs.   Current Outpatient Medications  Medication Sig  Dispense Refill   albuterol (VENTOLIN HFA) 108 (90 Base) MCG/ACT inhaler Inhale 1 puff into the lungs every 6 (six) hours as needed for wheezing or shortness of breath.     amLODipine (NORVASC) 10 MG tablet Take 1 tablet (10 mg total) by mouth daily. 90 tablet 0   atorvastatin (LIPITOR) 40 MG tablet Take 1 tablet (40 mg total) by mouth daily. 90 tablet 0   carvedilol (COREG) 12.5 MG tablet Take 12.5 mg by mouth 2 (two) times daily with a meal.     empagliflozin (JARDIANCE) 10 MG TABS tablet Take 1 tablet (10 mg total) by mouth daily. 90 tablet 0   fexofenadine (ALLEGRA) 180 MG tablet Take 180 mg by mouth daily as needed for allergies or rhinitis.     fluticasone (FLONASE) 50 MCG/ACT nasal spray Place 1 spray into both nostrils daily as needed for allergies or rhinitis.     Iron-FA-B Cmp-C-Biot-Probiotic (FUSION PLUS) CAPS Take 1 capsule by mouth daily.     sacubitril-valsartan (ENTRESTO) 24-26 MG Take 1 tablet by mouth 2 (two) times daily. 60 tablet 11   spironolactone (ALDACTONE) 25 MG tablet Take 1 tablet (25 mg total) by mouth daily. 90 tablet 0   No current facility-administered medications for this encounter.   Facility-Administered Medications Ordered in Other Encounters  Medication Dose Route Frequency Provider Last Rate Last Admin   gadopentetate dimeglumine (MAGNEVIST) injection 20 mL  20 mL Intravenous Once PRN Anson Fret, MD       Allergies  Allergen Reactions  Amoxicillin Other (See Comments)    Digestive issues   Social History   Socioeconomic History   Marital status: Married    Spouse name: Not on file   Number of children: 0   Years of education: 16   Highest education level: Not on file  Occupational History   Occupation: [employer]  Tobacco Use   Smoking status: Never   Smokeless tobacco: Never  Vaping Use   Vaping Use: Never used  Substance and Sexual Activity   Alcohol use: Not Currently    Comment: Socially   Drug use: No   Sexual activity:  Yes    Birth control/protection: Implant  Other Topics Concern   Not on file  Social History Narrative   Lives at home with boyfriend   Caffeine use: Drinks tea (4 cups per week)   No soda/coffee   Social Determinants of Health   Financial Resource Strain: Low Risk  (01/21/2019)   Overall Financial Resource Strain (CARDIA)    Difficulty of Paying Living Expenses: Not hard at all  Food Insecurity: No Food Insecurity (04/03/2022)   Hunger Vital Sign    Worried About Running Out of Food in the Last Year: Never true    Ran Out of Food in the Last Year: Never true  Transportation Needs: No Transportation Needs (04/03/2022)   PRAPARE - Administrator, Civil Service (Medical): No    Lack of Transportation (Non-Medical): No  Physical Activity: Insufficiently Active (01/21/2019)   Exercise Vital Sign    Days of Exercise per Week: 3 days    Minutes of Exercise per Session: 30 min  Stress: No Stress Concern Present (01/21/2019)   [employer] of Occupational Health - Occupational Stress Questionnaire    Feeling of Stress : Not at all  Social Connections: Moderately Integrated (01/21/2019)   Social Connection and Isolation Panel [NHANES]    Frequency of Communication with Friends and Family: More than three times a week    Frequency of Social Gatherings with Friends and Family: More than three times a week    Attends Religious Services: More than 4 times per year    Active Member of [employer] or Organizations: No    Attends Banker Meetings: Never    Marital Status: Married  Catering manager Violence: Not At Risk (04/03/2022)   Humiliation, Afraid, Rape, and Kick questionnaire    Fear of Current or Ex-Partner: No    Emotionally Abused: No    Physically Abused: No    Sexually Abused: No   Family History  Problem Relation Age of Onset   Migraines Father    Stroke Mother    Depression Mother    Lupus Maternal Aunt    Stroke Maternal Uncle    BP 104/60   Pulse  74   Wt 110 kg (242 lb 9.6 oz)   SpO2 100%   BMI 40.37 kg/m   Wt Readings from Last 3 Encounters:  07/25/22 110 kg (242 lb 9.6 oz)  05/26/22 107 kg (236 lb)  05/09/22 103.4 kg (228 lb)   PHYSICAL EXAM: General: NAD Neck: No JVD, no thyromegaly or thyroid nodule.  Lungs: Clear to auscultation bilaterally with normal respiratory effort. CV: Nondisplaced PMI.  Heart regular S1/S2, no S3/S4, no murmur.  No peripheral edema.  No carotid bruit.  Normal pedal pulses.  Abdomen: Soft, nontender, no hepatosplenomegaly, no distention.  Skin: Intact without lesions or rashes.  Neurologic: Alert and oriented x 3.  Psych: Normal affect.  Extremities: No clubbing or cyanosis.  HEENT: Normal.   ASSESSMENT & PLAN: 1. Chronic systolic CHF:  Admitted 2/24 with acute CHF and hypertensive urgency. Echo (2/24): EF 20-25%, RV normal. Suspect most likely HTN vs possible viral CM (had Flu 12/23). No ischemic CP. No family h/o HF.  No heavy ETOH or drugs.  cMRI (2/24) showed mild-mod LVH, LVEF 35% with diffuse HK, RV mildly reduced 45%, no myocardial LGE. Echo today was improved, EF 40%, mild LVH, normal RV, normal IVC.  NYHA class II, not volume overloaded on exam.  - Continue Coreg 12.5 mg bid. - Continue Jardiance 10 mg daily.  - Continue spiro 25 mg daily. - She is off hydralazine, so I will have her stop Imdur.  - Stop losartan, start Entresto 24/26 bid.  Will need to follow renal function and K carefully.  BMET/BNP today and BMET in 10 days.  - Needs to avoid pregnancy, has hormonal implant. 2. HTN: Reports h/o gestational HTN but stopped meds 3 months postpartum w/ reported improved BP control. BP 200/133 on admit in 2/24, associated unexplained hypokalemia raises concern for secondary hypertension, hyperaldosteronism. BP is currently well-controlled.  3. CKD IV: Renal biopsy with focal segmental glomerulosclerosis.  - She follows with Dr. Glenna Fellows for nephrology. - Continue Jardiance. BMET today. 4.  Iron deficiency anemia: h/o heavy vaginal bleeding w/uterine fibroids. 5. Hyperlipidemia: LDL 42 in 4/24 (goal < 55).  - Continue atorvastatin 40 mg daily.    Follow up in 3 wks with HF pharmacist for med titration, see APP in 2 months.   Marca Ancona 07/26/22

## 2022-07-30 ENCOUNTER — Other Ambulatory Visit (HOSPITAL_COMMUNITY): Payer: Self-pay | Admitting: Family Medicine

## 2022-08-01 ENCOUNTER — Other Ambulatory Visit (HOSPITAL_COMMUNITY): Payer: 59

## 2022-08-08 ENCOUNTER — Other Ambulatory Visit (HOSPITAL_COMMUNITY): Payer: 59

## 2022-08-11 ENCOUNTER — Ambulatory Visit (HOSPITAL_COMMUNITY)
Admission: RE | Admit: 2022-08-11 | Discharge: 2022-08-11 | Disposition: A | Discharge status: Home / Self Care | Payer: 59 | Source: Ambulatory Visit | Attending: Internal Medicine | Admitting: Internal Medicine

## 2022-08-11 DIAGNOSIS — I5022 Chronic systolic (congestive) heart failure: Secondary | ICD-10-CM | POA: Insufficient documentation

## 2022-08-11 LAB — BASIC METABOLIC PANEL
Anion gap: 10 (ref 5–15)
BUN: 18 mg/dL (ref 6–20)
CO2: 24 mmol/L (ref 22–32)
Calcium: 9.3 mg/dL (ref 8.9–10.3)
Chloride: 102 mmol/L (ref 98–111)
Creatinine, Ser: 1.87 mg/dL — ABNORMAL HIGH (ref 0.44–1.00)
GFR, Estimated: 36 mL/min — ABNORMAL LOW (ref >60)
Glucose, Bld: 100 mg/dL — ABNORMAL HIGH (ref 70–99)
Potassium: 4.6 mmol/L (ref 3.5–5.1)
Sodium: 136 mmol/L (ref 135–145)

## 2022-08-12 ENCOUNTER — Inpatient Hospital Stay (HOSPITAL_COMMUNITY): Admission: RE | Admit: 2022-08-12 | Payer: 59 | Source: Ambulatory Visit

## 2022-08-21 ENCOUNTER — Other Ambulatory Visit (HOSPITAL_COMMUNITY): Payer: Self-pay | Admitting: Family Medicine

## 2022-09-02 ENCOUNTER — Inpatient Hospital Stay (HOSPITAL_COMMUNITY): Admission: RE | Admit: 2022-09-02 | Payer: 59 | Source: Ambulatory Visit

## 2022-09-03 ENCOUNTER — Other Ambulatory Visit (HOSPITAL_COMMUNITY): Payer: Self-pay | Admitting: Family Medicine

## 2022-09-12 ENCOUNTER — Encounter (HOSPITAL_COMMUNITY): Payer: 59

## 2022-09-19 ENCOUNTER — Encounter (HOSPITAL_COMMUNITY): Payer: 59

## 2022-10-10 ENCOUNTER — Encounter (HOSPITAL_COMMUNITY): Payer: 59

## 2022-10-10 ENCOUNTER — Telehealth (HOSPITAL_COMMUNITY): Payer: Self-pay | Admitting: Cardiology

## 2022-10-10 NOTE — Telephone Encounter (Signed)
Pt has canceled a few same day appts, being sick and work conflict, please advise

## 2022-10-13 ENCOUNTER — Telehealth (HOSPITAL_COMMUNITY): Payer: Self-pay | Admitting: Licensed Clinical Social Worker

## 2022-10-13 NOTE — Telephone Encounter (Signed)
CSW received message that patient has cancelled and rescheduled multiple times. CSW contacted patient who states she has a transportation issue as she and her husband share one car. She states that she never knows when he may need the car last minute to get to work. CSW assured patient that a ride could be provided and to contact CSW if the need arises again to avoid missing her appointment. Patient grateful for the support and assistance. CSW available as needed. Lasandra Beech, LCSW, CCSW-MCS [phone]

## 2022-11-06 NOTE — Progress Notes (Signed)
ADVANCED HF CLINIC NOTE  Primary Care: [facility] Cardiologist: Dr. Shirlee Latch  HPI: 37 y.o.female w/ h/o HTN, obesity, chronic IDA and uterine fibroids. No prior cardiac history. 2 prior pregnancies. Last delivery was in 2021. Reports she had hypertension during pregnancy and treated w/ meds that continued up to 3 months postpartum, then stopped as she reports BP was better controlled.   In lab sleep study 2018 was negative   Admitted 2/24 with acute CHF and hypertensive urgency. Diuresed with IV lasix, nitroglycerin gtt. Echo showed EF 20-25%, global HK, no LVH, RV normal. + mod-large left pleural effusion.  Drips weaned and GDMT titrated but limited by elevated SCr. cMRI showed Mild-Mod LVH with diffuse HK and EF 35%, RV mildly reduced systolic function with EF 45%, no myocardial LGE. She was discharged home, weight 235 lbs.  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   Echo 5/24 showed EF 40%, mild LVH, normal RV, normal IVC.   Today she returns for AHF follow up. Overall feeling ***. Denies palpitations, CP, dizziness, edema, or PND/Orthopnea. *** SOB. Appetite ok. No fever or chills. Weight at home *** pounds. Taking all medications. ETOH, smoking ***   ECG (personally reviewed): NSR 60s 5/24  Labs (2/24): K 3.6, creatinine 2.04, Hgb 8.7,  ANA +, RF 70 Labs (4/24): LDL 42, creatinine 1.76 Labs (6/24): K 4.6, creatinine 1.67  PMH: 1. Uterine fibroids. 2. Iron deficiency anemia 3. HTN: First noted while pregnant.  4. 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  5. Chronic systolic CHF:  - cMRI (2/24): small R pleural effusion, small pericardial effusion, LVEF 35% with mild to moderate concentric LVH, RVEF 45%, no LGE. - Echo (2/24): EF 20-25%, RV normal, moderate to large L pleural effusion. - Echo (5/24): EF 40%, mild LVH, normal RV, normal IVC.  6. Zio monitor (4/24): 1 NSVT run, 6 beats.  Rare PVCs.    Current Outpatient Medications  Medication Sig Dispense Refill   albuterol (VENTOLIN HFA) 108 (90 Base) MCG/ACT inhaler Inhale 1 puff into the lungs every 6 (six) hours as needed for wheezing or shortness of breath.     amLODipine (NORVASC) 10 MG tablet TAKE 1 TABLET(10 MG) BY MOUTH DAILY 90 tablet 0   atorvastatin (LIPITOR) 40 MG tablet TAKE 1 TABLET(40 MG) BY MOUTH DAILY 90 tablet 0   carvedilol (COREG) 12.5 MG tablet TAKE 1 TABLET(12.5 MG) BY MOUTH TWICE DAILY WITH A MEAL 60 tablet 0   fexofenadine (ALLEGRA) 180 MG tablet Take 180 mg by mouth daily as needed for allergies or rhinitis.     fluticasone (FLONASE) 50 MCG/ACT nasal spray Place 1 spray into both nostrils daily as needed for allergies or rhinitis.     Iron-FA-B Cmp-C-Biot-Probiotic (FUSION PLUS) CAPS Take 1 capsule by mouth daily.     JARDIANCE 10 MG TABS tablet TAKE 1 TABLET(10 MG) BY MOUTH DAILY 90 tablet 0   sacubitril-valsartan (ENTRESTO) 24-26 MG Take 1 tablet by mouth 2 (two) times daily. 60 tablet 11   spironolactone (ALDACTONE) 25 MG tablet TAKE 1 TABLET(25 MG) BY MOUTH DAILY 90 tablet 0   No current facility-administered medications for this visit.   Facility-Administered Medications Ordered in Other Visits  Medication Dose Route Frequency Provider Last Rate Last Admin   gadopentetate dimeglumine (MAGNEVIST) injection 20 mL  20 mL Intravenous Once PRN Anson Fret, MD       Allergies  Allergen Reactions   Amoxicillin Other (See Comments)  Digestive issues   Social History   Socioeconomic History   Marital status: Married    Spouse name: Not on file   Number of children: 0   Years of education: 16   Highest education level: Not on file  Occupational History   Occupation: [employer]  Tobacco Use   Smoking status: Never   Smokeless tobacco: Never  Vaping Use   Vaping status: Never Used  Substance and Sexual Activity   Alcohol use: Not Currently    Comment: Socially   Drug use: No   Sexual  activity: Yes    Birth control/protection: Implant  Other Topics Concern   Not on file  Social History Narrative   Lives at home with boyfriend   Caffeine use: Drinks tea (4 cups per week)   No soda/coffee   Social Determinants of Health   Financial Resource Strain: Low Risk  (01/21/2019)   Overall Financial Resource Strain (CARDIA)    Difficulty of Paying Living Expenses: Not hard at all  Food Insecurity: No Food Insecurity (04/03/2022)   Hunger Vital Sign    Worried About Running Out of Food in the Last Year: Never true    Ran Out of Food in the Last Year: Never true  Transportation Needs: No Transportation Needs (04/03/2022)   PRAPARE - Administrator, Civil Service (Medical): No    Lack of Transportation (Non-Medical): No  Physical Activity: Insufficiently Active (01/21/2019)   Exercise Vital Sign    Days of Exercise per Week: 3 days    Minutes of Exercise per Session: 30 min  Stress: No Stress Concern Present (01/21/2019)   [employer] of Occupational Health - Occupational Stress Questionnaire    Feeling of Stress : Not at all  Social Connections: Moderately Integrated (01/21/2019)   Social Connection and Isolation Panel [NHANES]    Frequency of Communication with Friends and Family: More than three times a week    Frequency of Social Gatherings with Friends and Family: More than three times a week    Attends Religious Services: More than 4 times per year    Active Member of [employer] or Organizations: No    Attends Banker Meetings: Never    Marital Status: Married  Catering manager Violence: Not At Risk (04/03/2022)   Humiliation, Afraid, Rape, and Kick questionnaire    Fear of Current or Ex-Partner: No    Emotionally Abused: No    Physically Abused: No    Sexually Abused: No   Family History  Problem Relation Age of Onset   Migraines Father    Stroke Mother    Depression Mother    Lupus Maternal Aunt    Stroke Maternal Uncle    There  were no vitals taken for this visit.  Wt Readings from Last 3 Encounters:  07/25/22 110 kg (242 lb 9.6 oz)  05/26/22 107 kg (236 lb)  05/09/22 103.4 kg (228 lb)   PHYSICAL EXAM: General:  *** appearing.  No respiratory difficulty HEENT: normal Neck: supple. JVD *** cm. Carotids 2+ bilat; no bruits. No lymphadenopathy or thyromegaly appreciated. Cor: PMI nondisplaced. Regular rate & rhythm. No rubs, gallops or murmurs. Lungs: clear Abdomen: soft, nontender, nondistended. No hepatosplenomegaly. No bruits or masses. Good bowel sounds. Extremities: no cyanosis, clubbing, rash, edema  Neuro: alert & oriented x 3, cranial nerves grossly intact. moves all 4 extremities w/o difficulty. Affect pleasant.   ASSESSMENT & PLAN: 1. Chronic systolic CHF:  Admitted 2/24 with acute CHF and  hypertensive urgency. Echo (2/24): EF 20-25%, RV normal. Suspect most likely HTN vs possible viral CM (had Flu 12/23). No ischemic CP. No family h/o HF.  No heavy ETOH or drugs.  cMRI (2/24) showed mild-mod LVH, LVEF 35% with diffuse HK, RV mildly reduced 45%, no myocardial LGE. Echo today was improved, EF 40%, mild LVH, normal RV, normal IVC.  NYHA class II, not volume overloaded on exam. *** - Continue Coreg 12.5 mg bid. - Continue Jardiance 10 mg daily.  - Continue spiro 25 mg daily. - Off hydralazine/Imdur - Continue Entresto 24/26 bid.  Will need to follow renal function and K carefully.  BMET/BNP today and BMET in 10 days.  - Needs to avoid pregnancy, has hormonal implant. 2. HTN: Reports h/o gestational HTN but stopped meds 3 months postpartum w/ reported improved BP control. BP 200/133 on admit in 2/24, associated unexplained hypokalemia raises concern for secondary hypertension, hyperaldosteronism. BP is currently well-controlled.  3. CKD IV: Renal biopsy with focal segmental glomerulosclerosis.  - She follows with Dr. Glenna Fellows for nephrology. - Continue Jardiance. BMET today. 4. Iron deficiency anemia: h/o  heavy vaginal bleeding w/uterine fibroids. 5. Hyperlipidemia: LDL 42 in 4/24 (goal < 55).  - Continue atorvastatin 40 mg daily.    Follow up ***  Alen Bleacher 11/06/22

## 2022-11-07 ENCOUNTER — Other Ambulatory Visit (HOSPITAL_COMMUNITY): Payer: Self-pay | Admitting: Family Medicine

## 2022-11-10 ENCOUNTER — Encounter (HOSPITAL_COMMUNITY): Payer: Self-pay

## 2022-11-10 ENCOUNTER — Ambulatory Visit (HOSPITAL_COMMUNITY)
Admission: RE | Admit: 2022-11-10 | Discharge: 2022-11-10 | Disposition: A | Discharge status: Home / Self Care | Payer: 59 | Source: Ambulatory Visit | Attending: Internal Medicine | Admitting: Internal Medicine

## 2022-11-10 VITALS — BP 108/80 | HR 80 | Wt 267.2 lb

## 2022-11-10 DIAGNOSIS — E785 Hyperlipidemia, unspecified: Secondary | ICD-10-CM | POA: Diagnosis not present

## 2022-11-10 DIAGNOSIS — N184 Chronic kidney disease, stage 4 (severe): Secondary | ICD-10-CM | POA: Insufficient documentation

## 2022-11-10 DIAGNOSIS — I110 Hypertensive heart disease with heart failure: Secondary | ICD-10-CM | POA: Diagnosis present

## 2022-11-10 DIAGNOSIS — I5021 Acute systolic (congestive) heart failure: Secondary | ICD-10-CM | POA: Diagnosis present

## 2022-11-10 DIAGNOSIS — D631 Anemia in chronic kidney disease: Secondary | ICD-10-CM | POA: Diagnosis not present

## 2022-11-10 DIAGNOSIS — I130 Hypertensive heart and chronic kidney disease with heart failure and stage 1 through stage 4 chronic kidney disease, or unspecified chronic kidney disease: Secondary | ICD-10-CM | POA: Insufficient documentation

## 2022-11-10 DIAGNOSIS — I10 Essential (primary) hypertension: Secondary | ICD-10-CM

## 2022-11-10 DIAGNOSIS — I5022 Chronic systolic (congestive) heart failure: Secondary | ICD-10-CM | POA: Diagnosis not present

## 2022-11-10 DIAGNOSIS — D509 Iron deficiency anemia, unspecified: Secondary | ICD-10-CM | POA: Diagnosis not present

## 2022-11-10 DIAGNOSIS — I160 Hypertensive urgency: Secondary | ICD-10-CM | POA: Diagnosis present

## 2022-11-10 LAB — BASIC METABOLIC PANEL
Anion gap: 6 (ref 5–15)
BUN: 23 mg/dL — ABNORMAL HIGH (ref 6–20)
CO2: 22 mmol/L (ref 22–32)
Calcium: 9 mg/dL (ref 8.9–10.3)
Chloride: 106 mmol/L (ref 98–111)
Creatinine, Ser: 1.73 mg/dL — ABNORMAL HIGH (ref 0.44–1.00)
GFR, Estimated: 39 mL/min — ABNORMAL LOW (ref >60)
Glucose, Bld: 97 mg/dL (ref 70–99)
Potassium: 4.4 mmol/L (ref 3.5–5.1)
Sodium: 134 mmol/L — ABNORMAL LOW (ref 135–145)

## 2022-11-10 LAB — BRAIN NATRIURETIC PEPTIDE: B Natriuretic Peptide: 24.6 pg/mL (ref 0.0–100.0)

## 2022-11-10 MED ORDER — FUROSEMIDE 20 MG PO TABS: 20.0000 mg | ORAL_TABLET | Freq: Every day | ORAL | 3 refills | Status: DC

## 2022-11-10 NOTE — Progress Notes (Signed)
ReDS Vest / Clip - 11/10/22 0900       ReDS Vest / Clip   Station Marker B    Ruler Value 33    ReDS Value Range High volume overload    ReDS Actual Value 41

## 2022-11-10 NOTE — Patient Instructions (Signed)
Medication Changes:  START: LASIX (FUROSEMIDE) 20MG  ONCE DAILY   Lab Work:  Labs done today, your results will be available in MyChart, we will contact you for abnormal readings.  THEN RETURN IN 10 DAYS FOR REPEAT LABS AS SCHEDULED   Follow-Up in: 3 MONTHS AS SCHEDULED WITH DR. Shirlee Latch   At the [facility], you and your health needs are our priority. We have a designated team specialized in the treatment of Heart Failure. This Care Team includes your primary Heart Failure Specialized Cardiologist (physician), Advanced Practice Providers (APPs- Physician Assistants and Nurse Practitioners), and Pharmacist who all work together to provide you with the care you need, when you need it.   You may see any of the following providers on your designated Care Team at your next follow up:  Dr. Arvilla Meres Dr. Marca Ancona Dr. Marcos Eke, NP Robbie Lis, [place] [facility] [place], [place] Brynda Peon, NP Karle Plumber, PharmD   Please be sure to bring in all your medications bottles to every appointment.   Need to Contact us:  If you have any questions or concerns before your next appointment please send [place] a message through [place] or call our office at [phone].    TO LEAVE A MESSAGE FOR THE NURSE SELECT OPTION 2, PLEASE LEAVE A MESSAGE INCLUDING: YOUR NAME DATE OF BIRTH CALL BACK NUMBER REASON FOR CALL**this is important as we prioritize the call backs  YOU WILL RECEIVE A CALL BACK THE SAME DAY AS LONG AS YOU CALL BEFORE 4:00 PM

## 2022-11-21 ENCOUNTER — Ambulatory Visit (HOSPITAL_COMMUNITY)
Admission: RE | Admit: 2022-11-21 | Discharge: 2022-11-21 | Disposition: A | Discharge status: Home / Self Care | Payer: 59 | Source: Ambulatory Visit | Attending: Cardiology | Admitting: Cardiology

## 2022-11-21 DIAGNOSIS — I5022 Chronic systolic (congestive) heart failure: Secondary | ICD-10-CM | POA: Diagnosis present

## 2022-11-21 LAB — BASIC METABOLIC PANEL
Anion gap: 9 (ref 5–15)
BUN: 24 mg/dL — ABNORMAL HIGH (ref 6–20)
CO2: 22 mmol/L (ref 22–32)
Calcium: 8.8 mg/dL — ABNORMAL LOW (ref 8.9–10.3)
Chloride: 102 mmol/L (ref 98–111)
Creatinine, Ser: 1.92 mg/dL — ABNORMAL HIGH (ref 0.44–1.00)
GFR, Estimated: 34 mL/min — ABNORMAL LOW (ref >60)
Glucose, Bld: 105 mg/dL — ABNORMAL HIGH (ref 70–99)
Potassium: 4.1 mmol/L (ref 3.5–5.1)
Sodium: 133 mmol/L — ABNORMAL LOW (ref 135–145)

## 2023-01-05 ENCOUNTER — Other Ambulatory Visit (HOSPITAL_COMMUNITY): Payer: Self-pay | Admitting: Family Medicine

## 2023-02-10 ENCOUNTER — Other Ambulatory Visit (HOSPITAL_COMMUNITY): Payer: Self-pay | Admitting: Family Medicine

## 2023-02-13 ENCOUNTER — Encounter (HOSPITAL_COMMUNITY): Payer: Self-pay | Admitting: Cardiology

## 2023-02-13 ENCOUNTER — Ambulatory Visit (HOSPITAL_COMMUNITY)
Admission: RE | Admit: 2023-02-13 | Discharge: 2023-02-13 | Disposition: A | Discharge status: Home / Self Care | Payer: 59 | Source: Ambulatory Visit | Attending: Cardiology | Admitting: Cardiology

## 2023-02-13 VITALS — BP 104/60 | HR 73 | Wt 271.2 lb

## 2023-02-13 DIAGNOSIS — D259 Leiomyoma of uterus, unspecified: Secondary | ICD-10-CM | POA: Insufficient documentation

## 2023-02-13 DIAGNOSIS — I5022 Chronic systolic (congestive) heart failure: Secondary | ICD-10-CM | POA: Insufficient documentation

## 2023-02-13 DIAGNOSIS — R0602 Shortness of breath: Secondary | ICD-10-CM | POA: Diagnosis present

## 2023-02-13 DIAGNOSIS — E669 Obesity, unspecified: Secondary | ICD-10-CM | POA: Insufficient documentation

## 2023-02-13 DIAGNOSIS — E785 Hyperlipidemia, unspecified: Secondary | ICD-10-CM | POA: Insufficient documentation

## 2023-02-13 DIAGNOSIS — Z79899 Other long term (current) drug therapy: Secondary | ICD-10-CM | POA: Insufficient documentation

## 2023-02-13 DIAGNOSIS — Z7984 Long term (current) use of oral hypoglycemic drugs: Secondary | ICD-10-CM | POA: Diagnosis not present

## 2023-02-13 DIAGNOSIS — D631 Anemia in chronic kidney disease: Secondary | ICD-10-CM | POA: Insufficient documentation

## 2023-02-13 DIAGNOSIS — I130 Hypertensive heart and chronic kidney disease with heart failure and stage 1 through stage 4 chronic kidney disease, or unspecified chronic kidney disease: Secondary | ICD-10-CM | POA: Diagnosis not present

## 2023-02-13 DIAGNOSIS — Z6841 Body Mass Index (BMI) 40.0 and over, adult: Secondary | ICD-10-CM | POA: Insufficient documentation

## 2023-02-13 DIAGNOSIS — I160 Hypertensive urgency: Secondary | ICD-10-CM | POA: Insufficient documentation

## 2023-02-13 DIAGNOSIS — N184 Chronic kidney disease, stage 4 (severe): Secondary | ICD-10-CM | POA: Insufficient documentation

## 2023-02-13 LAB — BASIC METABOLIC PANEL
Anion gap: 6 (ref 5–15)
BUN: 15 mg/dL (ref 6–20)
CO2: 25 mmol/L (ref 22–32)
Calcium: 9.3 mg/dL (ref 8.9–10.3)
Chloride: 105 mmol/L (ref 98–111)
Creatinine, Ser: 2.1 mg/dL — ABNORMAL HIGH (ref 0.44–1.00)
GFR, Estimated: 31 mL/min — ABNORMAL LOW (ref >60)
Glucose, Bld: 102 mg/dL — ABNORMAL HIGH (ref 70–99)
Potassium: 4 mmol/L (ref 3.5–5.1)
Sodium: 136 mmol/L (ref 135–145)

## 2023-02-13 LAB — BRAIN NATRIURETIC PEPTIDE: B Natriuretic Peptide: 15.3 pg/mL (ref 0.0–100.0)

## 2023-02-13 MED ORDER — FUROSEMIDE 20 MG PO TABS: 20.0000 mg | ORAL_TABLET | ORAL | Status: AC

## 2023-02-13 MED ORDER — AMLODIPINE BESYLATE 5 MG PO TABS: 5.0000 mg | ORAL_TABLET | Freq: Every day | ORAL | 3 refills | Status: DC

## 2023-02-13 MED ORDER — ENTRESTO 49-51 MG PO TABS: 1.0000 | ORAL_TABLET | Freq: Two times a day (BID) | ORAL | 3 refills | Status: DC

## 2023-02-13 NOTE — Patient Instructions (Signed)
Medication Changes:  Increase Entresto to 49/51 mg Twice daily   Decrease Amlodipine to 5 mg Daily  Decrease Furosemide to 20 mg every other day   Lab Work:  Labs done today, your results will be available in MyChart, we will contact you for abnormal readings.  Your physician recommends that you return for lab work in: 1-2 weeks  Special Instructions // Education:  Do the following things EVERYDAY: Weigh yourself in the morning before breakfast. Write it down and keep it in a log. Take your medicines as prescribed Eat low salt foods--Limit salt (sodium) to 2000 mg per day.  Stay as active as you can everyday Limit all fluids for the day to less than 2 liters   Follow-Up in:   Please follow up with our heart failure pharmacist in 3-4 weeks  Your physician recommends that you schedule a follow-up appointment in: 2 months   At the [facility], you and your health needs are our priority. We have a designated team specialized in the treatment of Heart Failure. This Care Team includes your primary Heart Failure Specialized Cardiologist (physician), Advanced Practice Providers (APPs- Physician Assistants and Nurse Practitioners), and Pharmacist who all work together to provide you with the care you need, when you need it.   You may see any of the following providers on your designated Care Team at your next follow up:  Dr. Arvilla Meres Dr. Marca Ancona Dr. Dorthula Nettles Dr. Theresia Bough Tonye Becket, NP Robbie Lis, [place] [facility] [place], [place] Brynda Peon, NP [place] Lee, NP Karle Plumber, PharmD   Please be sure to bring in all your medications bottles to every appointment.   Need to Contact us:  If you have any questions or concerns before your next appointment please send [place] a message through [place] or call our office at [phone].    TO LEAVE A MESSAGE FOR THE NURSE SELECT OPTION 2, PLEASE LEAVE A MESSAGE INCLUDING: YOUR  NAME DATE OF BIRTH CALL BACK NUMBER REASON FOR CALL**this is important as we prioritize the call backs  YOU WILL RECEIVE A CALL BACK THE SAME DAY AS LONG AS YOU CALL BEFORE 4:00 PM

## 2023-02-15 NOTE — Progress Notes (Signed)
ADVANCED HF CLINIC NOTE  Primary Care: [facility] Cardiologist: Dr. Shirlee Latch  HPI: 37 y.o.female w/ h/o HTN, obesity, chronic IDA and uterine fibroids. No prior cardiac history. 2 prior pregnancies. Last delivery was in 2021. Reports she had hypertension during pregnancy and treated w/ meds that continued up to 3 months postpartum, then stopped as she reports BP was better controlled.   In lab sleep study 2018 was negative   Admitted 2/24 with acute CHF and hypertensive urgency. Diuresed with IV lasix, nitroglycerin gtt. Echo showed EF 20-25%, global HK, no LVH, RV normal. + mod-large left pleural effusion.  Drips weaned and GDMT titrated but limited by elevated SCr. cMRI showed Mild-Mod LVH with diffuse HK and EF 35%, RV mildly reduced systolic function with EF 45%, no myocardial LGE. She was discharged home, weight 235 lbs.  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   Echo 5/24 showed EF 40%, mild LVH, normal RV, normal IVC.   She returns for followup of CHF.  Weight up about 4 lbs.  She is short of breath if she runs/jogs, no dyspnea walking or going up stairs (has to climb 3 floors at work).  No lightheadedness or palpitations.  No orthopnea/PND.  No chest pain.  SPB 110s at home.   ECG (personally reviewed): NSR, LVH  Labs (2/24): K 3.6, creatinine 2.04, Hgb 8.7,  ANA +, RF 70 Labs (4/24): LDL 42, creatinine 1.76 Labs (6/24): K 4.6, creatinine 1.67 Labs (9/24): K 4.1, creatinine 1.92  PMH: 1. Uterine fibroids. 2. Iron deficiency anemia 3. HTN: First noted while pregnant.  4. 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  5. Chronic systolic CHF:  - cMRI (2/24): small R pleural effusion, small pericardial effusion, LVEF 35% with mild to moderate concentric LVH, RVEF 45%, no LGE. - Echo (2/24): EF 20-25%, RV normal, moderate to large L pleural effusion. - Echo (5/24): EF 40%, mild LVH, normal RV, normal IVC.   6. Zio monitor (4/24): 1 NSVT run, 6 beats.  Rare PVCs.   Current Outpatient Medications  Medication Sig Dispense Refill   albuterol (VENTOLIN HFA) 108 (90 Base) MCG/ACT inhaler Inhale 1 puff into the lungs every 6 (six) hours as needed for wheezing or shortness of breath.     atorvastatin (LIPITOR) 40 MG tablet TAKE 1 TABLET(40 MG) BY MOUTH DAILY 90 tablet 0   carvedilol (COREG) 12.5 MG tablet TAKE 1 TABLET(12.5 MG) BY MOUTH TWICE DAILY WITH A MEAL 60 tablet 0   fexofenadine (ALLEGRA) 180 MG tablet Take 180 mg by mouth daily as needed for allergies or rhinitis.     fluticasone (FLONASE) 50 MCG/ACT nasal spray Place 1 spray into both nostrils daily as needed for allergies or rhinitis.     Iron-FA-B Cmp-C-Biot-Probiotic (FUSION PLUS) CAPS Take 1 capsule by mouth daily.     JARDIANCE 10 MG TABS tablet TAKE 1 TABLET(10 MG) BY MOUTH DAILY 90 tablet 0   sacubitril-valsartan (ENTRESTO) 49-51 MG Take 1 tablet by mouth 2 (two) times daily. 60 tablet 3   spironolactone (ALDACTONE) 25 MG tablet Take 1 tablet (25 mg total) by mouth daily. 90 tablet 1   amLODipine (NORVASC) 5 MG tablet Take 1 tablet (5 mg total) by mouth daily. 30 tablet 3   furosemide (LASIX) 20 MG tablet Take 1 tablet (20 mg total) by mouth every other day.     No current facility-administered medications for this encounter.   Facility-Administered Medications Ordered in Other  Encounters  Medication Dose Route Frequency Provider Last Rate Last Admin   gadopentetate dimeglumine (MAGNEVIST) injection 20 mL  20 mL Intravenous Once PRN Anson Fret, MD       Allergies  Allergen Reactions   Amoxicillin Other (See Comments)    Digestive issues   Social History   Socioeconomic History   Marital status: Married    Spouse name: Not on file   Number of children: 0   Years of education: 16   Highest education level: Not on file  Occupational History   Occupation: [employer]  Tobacco Use   Smoking status: Never    Smokeless tobacco: Never  Vaping Use   Vaping status: Never Used  Substance and Sexual Activity   Alcohol use: Not Currently    Comment: Socially   Drug use: No   Sexual activity: Yes    Birth control/protection: Implant  Other Topics Concern   Not on file  Social History Narrative   Lives at home with boyfriend   Caffeine use: Drinks tea (4 cups per week)   No soda/coffee   Social Drivers of Corporate investment banker Strain: Low Risk  (01/21/2019)   Overall Financial Resource Strain (CARDIA)    Difficulty of Paying Living Expenses: Not hard at all  Food Insecurity: No Food Insecurity (04/03/2022)   Hunger Vital Sign    Worried About Running Out of Food in the Last Year: Never true    Ran Out of Food in the Last Year: Never true  Transportation Needs: No Transportation Needs (04/03/2022)   PRAPARE - Administrator, Civil Service (Medical): No    Lack of Transportation (Non-Medical): No  Physical Activity: Insufficiently Active (01/21/2019)   Exercise Vital Sign    Days of Exercise per Week: 3 days    Minutes of Exercise per Session: 30 min  Stress: No Stress Concern Present (01/21/2019)   [employer] of Occupational Health - Occupational Stress Questionnaire    Feeling of Stress : Not at all  Social Connections: Moderately Integrated (01/21/2019)   Social Connection and Isolation Panel [NHANES]    Frequency of Communication with Friends and Family: More than three times a week    Frequency of Social Gatherings with Friends and Family: More than three times a week    Attends Religious Services: More than 4 times per year    Active Member of [employer] or Organizations: No    Attends Banker Meetings: Never    Marital Status: Married  Catering manager Violence: Not At Risk (04/03/2022)   Humiliation, Afraid, Rape, and Kick questionnaire    Fear of Current or Ex-Partner: No    Emotionally Abused: No    Physically Abused: No    Sexually Abused: No    Family History  Problem Relation Age of Onset   Migraines Father    Stroke Mother    Depression Mother    Lupus Maternal Aunt    Stroke Maternal Uncle    BP 104/60   Pulse 73   Wt 123 kg (271 lb 3.2 oz)   SpO2 100%   BMI 45.13 kg/m   Wt Readings from Last 3 Encounters:  02/13/23 123 kg (271 lb 3.2 oz)  11/10/22 121.2 kg (267 lb 3.2 oz)  07/25/22 110 kg (242 lb 9.6 oz)   PHYSICAL EXAM: General: NAD Neck: No JVD, no thyromegaly or thyroid nodule.  Lungs: Clear to auscultation bilaterally with normal respiratory effort. CV: Nondisplaced PMI.  Heart regular S1/S2, no S3/S4, no murmur.  No peripheral edema.  No carotid bruit.  Normal pedal pulses.  Abdomen: Soft, nontender, no hepatosplenomegaly, no distention.  Skin: Intact without lesions or rashes.  Neurologic: Alert and oriented x 3.  Psych: Normal affect. Extremities: No clubbing or cyanosis.  HEENT: Normal.   ASSESSMENT & PLAN: 1. Chronic systolic CHF:  Admitted 2/24 with acute CHF and hypertensive urgency. Echo (2/24): EF 20-25%, RV normal. Suspect most likely HTN vs possible viral CM (had Flu 12/23). No ischemic CP. No family h/o HF.  No heavy ETOH or drugs.  cMRI (2/24) showed mild-mod LVH, LVEF 35% with diffuse HK, RV mildly reduced 45%, no myocardial LGE. Echo 5/24 was improved, EF 40%, mild LVH, normal RV, normal IVC.  NYHA class I-II. Not volume overloaded on exam though weight up a bit.  - Increase Entresto to 49/51 bid.  BMET/BNP today and in 10 days.  - Decrease Lasix to 20 mg every other day.  - Continue Coreg 12.5 mg bid. - Continue Jardiance 10 mg daily.  - Continue spiro 25 mg daily. - Off hydralazine/Imdur - Needs to avoid pregnancy, has hormonal implant. - EF is out of ICD range.  2. HTN: Reports h/o gestational HTN but stopped meds 3 months postpartum w/ reported improved BP control. BP 200/133 on admit in 2/24, associated unexplained hypokalemia raises concern for secondary hypertension,  hyperaldosteronism. BP is currently well-controlled.  - Decrease amlodipine to 5 mg daily with increase in Entresto.  3. CKD IV: Renal biopsy with focal segmental glomerulosclerosis.  - She follows with Dr. Glenna Fellows for nephrology. - Continue Jardiance.  4. Iron deficiency anemia: h/o heavy vaginal bleeding w/uterine fibroids. 5. Hyperlipidemia: LDL 42 in 4/24 (goal < 55).  - Continue atorvastatin 40 mg daily.    Follow up in 3 wks with HF pharmacist for med titration, see APP in 2 months.   Marca Ancona 02/15/23

## 2023-02-16 ENCOUNTER — Other Ambulatory Visit (HOSPITAL_COMMUNITY): Payer: Self-pay

## 2023-02-16 MED ORDER — ATORVASTATIN CALCIUM 40 MG PO TABS: 40.0000 mg | ORAL_TABLET | Freq: Every day | ORAL | 3 refills | Status: DC

## 2023-02-27 ENCOUNTER — Ambulatory Visit (HOSPITAL_COMMUNITY)
Admission: RE | Admit: 2023-02-27 | Discharge: 2023-02-27 | Disposition: A | Discharge status: Home / Self Care | Payer: 59 | Source: Ambulatory Visit | Attending: Internal Medicine | Admitting: Internal Medicine

## 2023-02-27 DIAGNOSIS — I5022 Chronic systolic (congestive) heart failure: Secondary | ICD-10-CM | POA: Insufficient documentation

## 2023-02-27 LAB — BASIC METABOLIC PANEL
Anion gap: 6 (ref 5–15)
BUN: 14 mg/dL (ref 6–20)
CO2: 24 mmol/L (ref 22–32)
Calcium: 9.1 mg/dL (ref 8.9–10.3)
Chloride: 106 mmol/L (ref 98–111)
Creatinine, Ser: 1.57 mg/dL — ABNORMAL HIGH (ref 0.44–1.00)
GFR, Estimated: 44 mL/min — ABNORMAL LOW (ref >60)
Glucose, Bld: 96 mg/dL (ref 70–99)
Potassium: 4.3 mmol/L (ref 3.5–5.1)
Sodium: 136 mmol/L (ref 135–145)

## 2023-03-09 NOTE — Progress Notes (Deleted)
 Marland Kitchen

## 2023-03-16 ENCOUNTER — Inpatient Hospital Stay (HOSPITAL_COMMUNITY)
Admission: RE | Admit: 2023-03-16 | Discharge: 2023-03-16 | Disposition: A | Discharge status: Home / Self Care | Payer: 59 | Source: Ambulatory Visit

## 2023-03-30 ENCOUNTER — Ambulatory Visit (HOSPITAL_COMMUNITY)
Admission: RE | Admit: 2023-03-30 | Discharge: 2023-03-30 | Disposition: A | Discharge status: Home / Self Care | Payer: 59 | Source: Ambulatory Visit | Attending: Cardiology | Admitting: Cardiology

## 2023-03-30 VITALS — BP 118/82 | HR 91 | Wt 280.0 lb

## 2023-03-30 DIAGNOSIS — I130 Hypertensive heart and chronic kidney disease with heart failure and stage 1 through stage 4 chronic kidney disease, or unspecified chronic kidney disease: Secondary | ICD-10-CM | POA: Diagnosis not present

## 2023-03-30 DIAGNOSIS — N184 Chronic kidney disease, stage 4 (severe): Secondary | ICD-10-CM | POA: Diagnosis not present

## 2023-03-30 DIAGNOSIS — N269 Renal sclerosis, unspecified: Secondary | ICD-10-CM | POA: Insufficient documentation

## 2023-03-30 DIAGNOSIS — E785 Hyperlipidemia, unspecified: Secondary | ICD-10-CM | POA: Diagnosis not present

## 2023-03-30 DIAGNOSIS — D500 Iron deficiency anemia secondary to blood loss (chronic): Secondary | ICD-10-CM | POA: Insufficient documentation

## 2023-03-30 DIAGNOSIS — Z79899 Other long term (current) drug therapy: Secondary | ICD-10-CM | POA: Insufficient documentation

## 2023-03-30 DIAGNOSIS — I5022 Chronic systolic (congestive) heart failure: Secondary | ICD-10-CM | POA: Diagnosis not present

## 2023-03-30 DIAGNOSIS — D259 Leiomyoma of uterus, unspecified: Secondary | ICD-10-CM | POA: Insufficient documentation

## 2023-03-30 MED ORDER — ENTRESTO 97-103 MG PO TABS: 1.0000 | ORAL_TABLET | Freq: Two times a day (BID) | ORAL | 3 refills | Status: DC

## 2023-03-30 NOTE — Progress Notes (Signed)
Advanced Heart Failure Clinic Note   Primary Care: [facility] Cardiologist: Dr. Shirlee Latch   HPI: 38 year-old female with PMH of HTN, obesity, chronic IDA and uterine fibroids. No prior cardiac history. She has had 2 prior pregnancies. Last delivery was in 2021. Reports she had hypertension during pregnancy and treated with meds that were continued up to 3 months postpartum, then stopped as she reports BP was better controlled.    In-lab sleep study in 2018 was negative.   Admitted 04/26/22 with acute on chronic HF exacerbation and hypertensive urgency. She was treated IV diuretics and nitroglycerin infusion. Echo showed LVEF 20-25%, global HK, no LVH, RV normal, and mod-large left pleural effusion. Drips weaned and GDMT titrated but limited by elevated SCr. cMRI showed Mild-Mod LVH with diffuse HK and LVEF 35%, RV mildly reduced systolic function with LVEF 45%, no myocardial LGE. She was discharged home, with a weight 235 lbs.   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    ECHO 07/25/22 showed LVEF 40%, mild LVH, normal RV, normal IVC.    On 02/13/23, she returned for followup of CHF.  Her weight was up about 4 lbs.  She was short of breath if she runs/jogs, no dyspnea walking or going up stairs (had to climb 3 floors at work).  No lightheadedness or palpitations.  No orthopnea/PND.  No chest pain. SBP 110s at home.   Today she returns to HF clinic for pharmacist medication titration. At last visit with MD Sherryll Burger was increased to 49/51 mg twice daily and Lasix was decreased to 20 mg every other day. Overall she is feeling well today. No dizziness or lightheadedness. No CP or palpitations. Does not report getting SOB when walking at work or up stairs. Has been weighing herself at home and remembers most recent weight being 276 lbs. Weight in clinic of 280 lbs is up 9 lbs from last clinic visit. Has not had to take additional doses of Lasix since the  previous dose reduction. No LEE, PND or orthopnea. Appetite has been ok. Has been trying to be better about following a low sodium diet. She manages her own medications. Medication history completed in clinic today. Her recent prescription dispense history indicates strong adherence to her medications. Her blood pressure at home ranges from 115-125/78-82 mmHg. Her blood pressure in clinic was consistent with reported home readings at 118/82 mmHg.     HF Medications: Carvedilol 12.5 mg twice daily Entresto 49/51 mg twice daily Spironolactone 25 mg daily Jardiance 10 mg daily Lasix 20 mg every other day    Has the patient been experiencing any side effects to the medications prescribed?  Yes--she reports tinnitus when lying supine  Does the patient have any problems obtaining medications due to transportation or finances?   No; [employer]  Understanding of regimen: good Understanding of indications: good Potential of compliance: good Patient understands to avoid NSAIDs. Patient understands to avoid decongestants.    Pertinent Lab Values: Labs (04/2022): K 3.6 mmol/L, creatinine 2.04 mg/dL, Hgb 8.7 mg/dL,  ANA +, RF 70 Labs (10/2954): LDL 42 mg/dL, creatinine 2.13 mg/dL Labs (0/8657): K 4.6 mmol/L, creatinine 1.67 mg/dL Labs (10/4694): K 4.1 mmol/L, creatinine 1.92 mg/dL Labs (29/52/84): K 4.0 mmol/L, creatinine 2.10 mg/dL Labs (13/24/40): K 4.3 mmol/L, creatinine 1.57 mg/dL, BUN 14 mg/dL, Na 102 mmol/L, BNP 72.5 ng/L  Vital Signs: Weight: 280.0 lbs (last clinic weight: 271 lbs) Blood pressure: 118/82 mmHg Heart rate: 91  bpm  ASSESSMENT & PLAN: 1. Chronic systolic CHF:  Admitted 04/2022 with acute CHF and hypertensive urgency. Echo (04/2022): EF 20-25%, RV normal. Suspect most likely HTN vs possible viral CM (had Flu 01/2022). No ischemic CP. No family h/o HF. No heavy ETOH or drugs. cMRI (04/2022) showed mild-mod LVH, LVEF 35% with diffuse HK, RV mildly reduced 45%, no  myocardial LGE. Echo 07/2022 was improved, EF 40%, mild LVH, normal RV, normal IVC.  -  NYHA symptoms I-II. Not volume overloaded on exam though weight is up 9 lbs from last clinic visit. - Continue Lasix 20 mg every other day.  - Continue carvedilol 12.5 mg twice daily - Increase Entresto to 97/103 mg twice daily. Repeat BMET in 2 weeks.  - Continue spironolactone 25 mg daily. - Continue Jardiance 10 mg daily.  - Needs to avoid pregnancy, started drosperenone 4 mg daily - EF is out of ICD range.  2. HTN: Reports h/o gestational HTN but stopped meds 3 months postpartum w/ reported improved BP control. BP 200/133 on admit in 04/2022, associated unexplained hypokalemia raises concern for secondary hypertension, hyperaldosteronism. BP is currently well-controlled.  - Discontinue amlodipine with increase in Entresto.  3. CKD IV: Renal biopsy with focal segmental glomerulosclerosis.  - She follows with Dr. Glenna Fellows for nephrology. - Continue Jardiance.  4. Iron deficiency anemia: h/o heavy vaginal bleeding w/uterine fibroids. 5. Hyperlipidemia: LDL 42 in 06/2022 (goal < 55).  - Continue atorvastatin 40 mg daily.  Follow up in 2 weeks with APP clinic   Wilmer Floor, PharmD PGY2 Cardiology Pharmacy Resident  Karle Plumber, PharmD, BCPS, [facility] CPP Heart Failure Clinic Pharmacist [phone]

## 2023-03-30 NOTE — Patient Instructions (Signed)
It was a pleasure seeing you today!  MEDICATIONS: -We are changing your medications today -Increase Entresto from 49/51 mg (#1 tablet) twice daily to 97/103 mg (#1 tablet) twice daily -Stop amlodipine 5 mg daily -Call if you have questions about your medications.  LABS: -We will call you if your labs need attention.  NEXT APPOINTMENT: Return to clinic in 2 weeks with Dr. Shirlee Latch.  In general, to take care of your heart failure: -Limit your fluid intake to 2 Liters (half-gallon) per day.   -Limit your salt intake to ideally 2-3 grams (2000-3000 mg) per day. -Weigh yourself daily and record, and bring that "weight diary" to your next appointment.  (Weight gain of 2-3 pounds in 1 day typically means fluid weight.) -The medications for your heart are to help your heart and help you live longer.   -Please contact us before stopping any of your heart medications.  Call the clinic at [phone] with questions or to reschedule future appointments.

## 2023-04-15 NOTE — Progress Notes (Signed)
ADVANCED HF CLINIC NOTE  Primary Care: [facility] Cardiologist: Dr. Shirlee Latch  HPI: 38 y.o.female w/ h/o HTN, obesity, chronic IDA and uterine fibroids. No prior cardiac history. 2 prior pregnancies. Last delivery was in 2021. Reports she had hypertension during pregnancy and treated w/ meds that continued up to 3 months postpartum, then stopped as she reports BP was better controlled.   In lab sleep study 2018 was negative   Admitted 2/24 with acute CHF and hypertensive urgency. Diuresed with IV lasix, nitroglycerin gtt. Echo showed EF 20-25%, global HK, no LVH, RV normal. + mod-large left pleural effusion.  Drips weaned and GDMT titrated but limited by elevated SCr. cMRI showed Mild-Mod LVH with diffuse HK and EF 35%, RV mildly reduced systolic function with EF 45%, no myocardial LGE. She was discharged home, weight 235 lbs.  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   Echo 5/24 showed EF 40%, mild LVH, normal RV, normal IVC.   Today she returns for HF follow up. Overall feeling fine. No SOB with activity. Denies palpitations, abnormal bleeding, CP, dizziness, edema, or PND/Orthopnea. Appetite ok. No fever or chills. Weight at home 177 pounds. Taking all medications. Takes Lasix PRN.   ECG (personally reviewed): none ordered today.  Labs (2/24): K 3.6, creatinine 2.04, Hgb 8.7,  ANA +, RF 70 Labs (4/24): LDL 42, creatinine 1.76 Labs (6/24): K 4.6, creatinine 1.67 Labs (9/24): K 4.1, creatinine 1.92 Labs (12/24): K 4.3, creatinine 1.57  PMH: 1. Uterine fibroids. 2. Iron deficiency anemia 3. HTN: First noted while pregnant.  4. 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  5. Chronic systolic CHF:  - cMRI (2/24): small R pleural effusion, small pericardial effusion, LVEF 35% with mild to moderate concentric LVH, RVEF 45%, no LGE. - Echo (2/24): EF 20-25%, RV normal, moderate to large L pleural effusion. - Echo  (5/24): EF 40%, mild LVH, normal RV, normal IVC.  6. Zio monitor (4/24): 1 NSVT run, 6 beats.  Rare PVCs.   Current Outpatient Medications  Medication Sig Dispense Refill   albuterol (VENTOLIN HFA) 108 (90 Base) MCG/ACT inhaler Inhale 1 puff into the lungs every 6 (six) hours as needed for wheezing or shortness of breath.     atorvastatin (LIPITOR) 40 MG tablet Take 1 tablet (40 mg total) by mouth daily. 90 tablet 3   carvedilol (COREG) 12.5 MG tablet TAKE 1 TABLET(12.5 MG) BY MOUTH TWICE DAILY WITH A MEAL 60 tablet 0   Drospirenone 4 MG TABS Take 4 mg by mouth daily.     fexofenadine (ALLEGRA) 180 MG tablet Take 180 mg by mouth daily as needed for allergies or rhinitis.     fluticasone (FLONASE) 50 MCG/ACT nasal spray Place 1 spray into both nostrils daily as needed for allergies or rhinitis.     Iron-FA-B Cmp-C-Biot-Probiotic (FUSION PLUS) CAPS Take 1 capsule by mouth daily.     JARDIANCE 10 MG TABS tablet TAKE 1 TABLET(10 MG) BY MOUTH DAILY 90 tablet 0   sacubitril-valsartan (ENTRESTO) 97-103 MG Take 1 tablet by mouth 2 (two) times daily. 60 tablet 3   spironolactone (ALDACTONE) 25 MG tablet Take 1 tablet (25 mg total) by mouth daily. 90 tablet 1   furosemide (LASIX) 20 MG tablet Take 1 tablet (20 mg total) by mouth every other day. (Patient not taking: Reported on 04/17/2023)     No current facility-administered medications for this encounter.   Facility-Administered Medications Ordered in Other Encounters  Medication Dose Route Frequency Provider Last Rate Last Admin   gadopentetate dimeglumine (MAGNEVIST) injection 20 mL  20 mL Intravenous Once PRN Anson Fret, MD       Allergies  Allergen Reactions   Amoxicillin Other (See Comments)    Digestive issues   Social History   Socioeconomic History   Marital status: Married    Spouse name: Not on file   Number of children: 0   Years of education: 16   Highest education level: Not on file  Occupational History   Occupation:  [employer]  Tobacco Use   Smoking status: Never   Smokeless tobacco: Never  Vaping Use   Vaping status: Never Used  Substance and Sexual Activity   Alcohol use: Not Currently    Comment: Socially   Drug use: No   Sexual activity: Yes    Birth control/protection: Implant  Other Topics Concern   Not on file  Social History Narrative   Lives at home with boyfriend   Caffeine use: Drinks tea (4 cups per week)   No soda/coffee   Social Drivers of Corporate investment banker Strain: Low Risk  (01/21/2019)   Overall Financial Resource Strain (CARDIA)    Difficulty of Paying Living Expenses: Not hard at all  Food Insecurity: No Food Insecurity (04/03/2022)   Hunger Vital Sign    Worried About Running Out of Food in the Last Year: Never true    Ran Out of Food in the Last Year: Never true  Transportation Needs: No Transportation Needs (04/03/2022)   PRAPARE - Administrator, Civil Service (Medical): No    Lack of Transportation (Non-Medical): No  Physical Activity: Insufficiently Active (01/21/2019)   Exercise Vital Sign    Days of Exercise per Week: 3 days    Minutes of Exercise per Session: 30 min  Stress: No Stress Concern Present (01/21/2019)   [employer] of Occupational Health - Occupational Stress Questionnaire    Feeling of Stress : Not at all  Social Connections: Moderately Integrated (01/21/2019)   Social Connection and Isolation Panel [NHANES]    Frequency of Communication with Friends and Family: More than three times a week    Frequency of Social Gatherings with Friends and Family: More than three times a week    Attends Religious Services: More than 4 times per year    Active Member of [employer] or Organizations: No    Attends Banker Meetings: Never    Marital Status: Married  Catering manager Violence: Not At Risk (04/03/2022)   Humiliation, Afraid, Rape, and Kick questionnaire    Fear of Current or Ex-Partner: No    Emotionally  Abused: No    Physically Abused: No    Sexually Abused: No   Family History  Problem Relation Age of Onset   Migraines Father    Stroke Mother    Depression Mother    Lupus Maternal Aunt    Stroke Maternal Uncle    BP 112/74   Pulse 86   Ht 5\' 5"  (1.651 m)   Wt 126.6 kg (279 lb 3.2 oz)   SpO2 98%   BMI 46.46 kg/m   Wt Readings from Last 3 Encounters:  04/17/23 126.6 kg (279 lb 3.2 oz)  03/30/23 127 kg (280 lb)  02/13/23 123 kg (271 lb 3.2 oz)   PHYSICAL EXAM: General:  NAD. No resp difficulty, walked into clinic HEENT: Normal Neck: Supple. No JVD. Cor: Regular rate & rhythm. No  rubs, gallops or murmurs. Lungs: Clear Abdomen: Soft, obese, nontender, nondistended.  Extremities: No cyanosis, clubbing, rash, edema Neuro: Alert & oriented x 3, moves all 4 extremities w/o difficulty. Affect pleasant.  ASSESSMENT & PLAN: 1. Chronic systolic CHF:  Admitted 2/24 with acute CHF and hypertensive urgency. Echo (2/24): EF 20-25%, RV normal. Suspect most likely HTN vs possible viral CM (had Flu 12/23). No ischemic CP. No family h/o HF.  No heavy ETOH or drugs.  cMRI (2/24) showed mild-mod LVH, LVEF 35% with diffuse HK, RV mildly reduced 45%, no myocardial LGE. Echo 5/24 was improved, EF 40%, mild LVH, normal RV, normal IVC.  NYHA class I. She is not volume overloaded on exam. - Continue Entresto 97/103 mg bid. BMET/BNP today. - Continue Lasix 20 mg PRN. - Continue Coreg 12.5 mg bid. - Continue Jardiance 10 mg daily. No GU symptoms. - Continue spiro 25 mg daily. - Off hydralazine/Imdur - Needs to avoid pregnancy, has hormonal implant. - Update echo. Last EF was out of ICD range.  2. HTN: Reports h/o gestational HTN but stopped meds 3 months postpartum w/ reported improved BP control. BP 200/133 on admit in 2/24, associated unexplained hypokalemia raises concern for secondary hypertension, hyperaldosteronism.  - BP is currently well-controlled.  - Continue current meds 3. CKD IV:  Renal biopsy with focal segmental glomerulosclerosis.  - She follows with Dr. Glenna Fellows for nephrology. - Continue Jardiance. BMET today. 4. Iron deficiency anemia: h/o heavy vaginal bleeding w/uterine fibroids. - No change. 5. Hyperlipidemia: LDL 42 in 4/24 (goal < 55).  - Continue atorvastatin 40 mg daily. Check lipids at next viist.   Follow up in 3-4 months with Dr. Shirlee Latch + echo  [facility] FNP-BC 04/17/23

## 2023-04-16 ENCOUNTER — Telehealth (HOSPITAL_COMMUNITY): Payer: Self-pay

## 2023-04-16 NOTE — Telephone Encounter (Signed)
Called and was unable to leave patient a voice message to confirm/remind patient of their appointment at the Advanced Heart Failure Clinic on 04/17/23.   And to bring in all medications and/or complete list.

## 2023-04-17 ENCOUNTER — Ambulatory Visit (HOSPITAL_COMMUNITY)
Admission: RE | Admit: 2023-04-17 | Discharge: 2023-04-17 | Disposition: A | Discharge status: Home / Self Care | Payer: 59 | Source: Ambulatory Visit | Attending: Family Medicine | Admitting: Family Medicine

## 2023-04-17 ENCOUNTER — Encounter (HOSPITAL_COMMUNITY): Payer: Self-pay

## 2023-04-17 VITALS — BP 112/74 | HR 86 | Ht 65.0 in | Wt 279.2 lb

## 2023-04-17 DIAGNOSIS — I10 Essential (primary) hypertension: Secondary | ICD-10-CM

## 2023-04-17 DIAGNOSIS — E785 Hyperlipidemia, unspecified: Secondary | ICD-10-CM | POA: Insufficient documentation

## 2023-04-17 DIAGNOSIS — Z79899 Other long term (current) drug therapy: Secondary | ICD-10-CM | POA: Insufficient documentation

## 2023-04-17 DIAGNOSIS — E669 Obesity, unspecified: Secondary | ICD-10-CM | POA: Diagnosis not present

## 2023-04-17 DIAGNOSIS — N184 Chronic kidney disease, stage 4 (severe): Secondary | ICD-10-CM | POA: Diagnosis not present

## 2023-04-17 DIAGNOSIS — I130 Hypertensive heart and chronic kidney disease with heart failure and stage 1 through stage 4 chronic kidney disease, or unspecified chronic kidney disease: Secondary | ICD-10-CM | POA: Diagnosis not present

## 2023-04-17 DIAGNOSIS — D509 Iron deficiency anemia, unspecified: Secondary | ICD-10-CM | POA: Diagnosis not present

## 2023-04-17 DIAGNOSIS — I5022 Chronic systolic (congestive) heart failure: Secondary | ICD-10-CM | POA: Insufficient documentation

## 2023-04-17 DIAGNOSIS — Z86018 Personal history of other benign neoplasm: Secondary | ICD-10-CM | POA: Insufficient documentation

## 2023-04-17 LAB — BASIC METABOLIC PANEL
Anion gap: 9 (ref 5–15)
BUN: 16 mg/dL (ref 6–20)
CO2: 23 mmol/L (ref 22–32)
Calcium: 9.1 mg/dL (ref 8.9–10.3)
Chloride: 104 mmol/L (ref 98–111)
Creatinine, Ser: 1.67 mg/dL — ABNORMAL HIGH (ref 0.44–1.00)
GFR, Estimated: 40 mL/min — ABNORMAL LOW (ref >60)
Glucose, Bld: 97 mg/dL (ref 70–99)
Potassium: 4.2 mmol/L (ref 3.5–5.1)
Sodium: 136 mmol/L (ref 135–145)

## 2023-04-17 NOTE — Patient Instructions (Addendum)
Thank you for coming in today  If you had labs drawn today, any labs that are abnormal the clinic will call you No news is good news  Medications: No changes  Follow up appointments:  Your physician recommends that you schedule a follow-up appointment in:  3-4 months With Dr. Shirlee Latch with echocardiogram   You will receive a reminder letter in the mail a few months in advance. If you don't receive a letter, please call our office to schedule the follow-up appointment.  Your physician has requested that you have an echocardiogram. Echocardiography is a painless test that uses sound waves to create images of your heart. It provides your doctor with information about the size and shape of your heart and how well your heart's chambers and valves are working. This procedure takes approximately one hour. There are no restrictions for this procedure.    Do the following things EVERYDAY: Weigh yourself in the morning before breakfast. Write it down and keep it in a log. Take your medicines as prescribed Eat low salt foods--Limit salt (sodium) to 2000 mg per day.  Stay as active as you can everyday Limit all fluids for the day to less than 2 liters   At the Advanced Heart Failure Clinic, you and your health needs are our priority. As part of our continuing mission to provide you with exceptional heart care, we have created designated Provider Care Teams. These Care Teams include your primary Cardiologist (physician) and Advanced Practice Providers (APPs- Physician Assistants and Nurse Practitioners) who all work together to provide you with the care you need, when you need it.   You may see any of the following providers on your designated Care Team at your next follow up: Dr Arvilla Meres Dr Marca Ancona Dr. Marcos Eke, NP Robbie Lis, [place] [facility] [place], [place] Brynda Peon, NP Karle Plumber, PharmD   Please be sure to bring in all your medications  bottles to every appointment.    Thank you for choosing [facility]  If you have any questions or concerns before your next appointment please send [place] a message through [place] or call our office at [phone].    TO LEAVE A MESSAGE FOR THE NURSE SELECT OPTION 2, PLEASE LEAVE A MESSAGE INCLUDING: YOUR NAME DATE OF BIRTH CALL BACK NUMBER REASON FOR CALL**this is important as we prioritize the call backs  YOU WILL RECEIVE A CALL BACK THE SAME DAY AS LONG AS YOU CALL BEFORE 4:00 PM

## 2023-05-02 ENCOUNTER — Other Ambulatory Visit (HOSPITAL_COMMUNITY): Payer: Self-pay | Admitting: Family Medicine

## 2023-06-08 ENCOUNTER — Other Ambulatory Visit (HOSPITAL_COMMUNITY): Payer: Self-pay | Admitting: Family Medicine

## 2023-08-06 ENCOUNTER — Other Ambulatory Visit (HOSPITAL_COMMUNITY): Payer: Self-pay

## 2023-08-06 DIAGNOSIS — I5022 Chronic systolic (congestive) heart failure: Secondary | ICD-10-CM

## 2023-08-06 MED ORDER — SPIRONOLACTONE 25 MG PO TABS: 25.0000 mg | ORAL_TABLET | Freq: Every day | ORAL | 1 refills | Status: DC

## 2023-09-06 ENCOUNTER — Other Ambulatory Visit (HOSPITAL_COMMUNITY): Payer: Self-pay | Admitting: Cardiology

## 2023-12-31 ENCOUNTER — Other Ambulatory Visit (HOSPITAL_COMMUNITY): Payer: Self-pay | Admitting: Cardiology

## 2024-02-04 ENCOUNTER — Other Ambulatory Visit (HOSPITAL_COMMUNITY): Payer: Self-pay | Admitting: Cardiology

## 2024-02-05 ENCOUNTER — Other Ambulatory Visit (HOSPITAL_COMMUNITY): Payer: Self-pay

## 2024-02-05 MED ORDER — SACUBITRIL-VALSARTAN 97-103 MG PO TABS: 1.0000 | ORAL_TABLET | Freq: Two times a day (BID) | ORAL | 3 refills | Status: AC

## 2024-03-23 ENCOUNTER — Other Ambulatory Visit (HOSPITAL_COMMUNITY): Payer: Self-pay | Admitting: Family Medicine

## 2024-03-23 ENCOUNTER — Other Ambulatory Visit (HOSPITAL_COMMUNITY): Payer: Self-pay | Admitting: Cardiology

## 2024-03-23 DIAGNOSIS — I5022 Chronic systolic (congestive) heart failure: Secondary | ICD-10-CM
